# Patient Record
Sex: Female | Born: 1945 | Race: White | Hispanic: No | Marital: Married | Smoking: Former smoker
Health system: Southern US, Community
[De-identification: ages and names within clinical notes are randomized; demographics above are authoritative.]

## PROBLEM LIST (undated history)

## (undated) DIAGNOSIS — E785 Hyperlipidemia, unspecified: Secondary | ICD-10-CM

## (undated) DIAGNOSIS — M8580 Other specified disorders of bone density and structure, unspecified site: Secondary | ICD-10-CM

## (undated) DIAGNOSIS — I10 Essential (primary) hypertension: Secondary | ICD-10-CM

## (undated) DIAGNOSIS — M1990 Unspecified osteoarthritis, unspecified site: Secondary | ICD-10-CM

## (undated) DIAGNOSIS — H269 Unspecified cataract: Secondary | ICD-10-CM

## (undated) HISTORY — PX: LAPAROSCOPIC CHOLECYSTECTOMY: SUR755

## (undated) HISTORY — DX: Unspecified cataract: H26.9

## (undated) HISTORY — DX: Hyperlipidemia, unspecified: E78.5

## (undated) HISTORY — PX: OOPHORECTOMY: SHX86

## (undated) HISTORY — DX: Essential (primary) hypertension: I10

## (undated) HISTORY — DX: Other specified disorders of bone density and structure, unspecified site: M85.80

---
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
## 1997-12-27 ENCOUNTER — Emergency Department (HOSPITAL_COMMUNITY): Admission: EM | Admit: 1997-12-27 | Discharge: 1997-12-27 | Payer: Self-pay | Admitting: Emergency Medicine

## 1998-05-13 ENCOUNTER — Other Ambulatory Visit: Admission: RE | Admit: 1998-05-13 | Discharge: 1998-05-13 | Payer: Self-pay | Admitting: Family Medicine

## 1998-09-04 ENCOUNTER — Encounter: Payer: Self-pay | Admitting: Emergency Medicine

## 1998-09-04 ENCOUNTER — Emergency Department (HOSPITAL_COMMUNITY): Admission: EM | Admit: 1998-09-04 | Discharge: 1998-09-04 | Payer: Self-pay | Admitting: Emergency Medicine

## 1999-10-16 HISTORY — PX: CYSTOSCOPY: SUR368

## 2000-03-27 ENCOUNTER — Other Ambulatory Visit: Admission: RE | Admit: 2000-03-27 | Discharge: 2000-03-27 | Payer: Self-pay | Admitting: Obstetrics and Gynecology

## 2001-07-15 LAB — HM COLONOSCOPY: HM Colonoscopy: NORMAL

## 2001-07-26 ENCOUNTER — Emergency Department (HOSPITAL_COMMUNITY): Admission: EM | Admit: 2001-07-26 | Discharge: 2001-07-26 | Payer: Self-pay | Admitting: Emergency Medicine

## 2001-07-26 ENCOUNTER — Encounter: Payer: Self-pay | Admitting: Emergency Medicine

## 2001-08-12 ENCOUNTER — Ambulatory Visit (HOSPITAL_COMMUNITY): Admission: RE | Admit: 2001-08-12 | Discharge: 2001-08-12 | Payer: Self-pay | Admitting: Gastroenterology

## 2002-06-17 ENCOUNTER — Other Ambulatory Visit: Admission: RE | Admit: 2002-06-17 | Discharge: 2002-06-17 | Payer: Self-pay | Admitting: Obstetrics and Gynecology

## 2003-07-16 ENCOUNTER — Encounter: Payer: Self-pay | Admitting: Family Medicine

## 2003-07-16 LAB — HM MAMMOGRAPHY

## 2003-10-12 ENCOUNTER — Other Ambulatory Visit: Admission: RE | Admit: 2003-10-12 | Discharge: 2003-10-12 | Payer: Self-pay | Admitting: Obstetrics and Gynecology

## 2004-03-28 ENCOUNTER — Ambulatory Visit: Payer: Self-pay | Admitting: Family Medicine

## 2004-08-01 ENCOUNTER — Ambulatory Visit: Payer: Self-pay | Admitting: Family Medicine

## 2004-08-04 ENCOUNTER — Ambulatory Visit: Payer: Self-pay | Admitting: Family Medicine

## 2004-10-06 ENCOUNTER — Ambulatory Visit: Payer: Self-pay | Admitting: Family Medicine

## 2004-10-19 ENCOUNTER — Other Ambulatory Visit: Admission: RE | Admit: 2004-10-19 | Discharge: 2004-10-19 | Payer: Self-pay | Admitting: Obstetrics and Gynecology

## 2005-01-30 ENCOUNTER — Ambulatory Visit: Payer: Self-pay | Admitting: Family Medicine

## 2005-08-28 ENCOUNTER — Ambulatory Visit: Payer: Self-pay | Admitting: Family Medicine

## 2005-08-28 LAB — CONVERTED CEMR LAB: Hgb A1c MFr Bld: 5.8 %

## 2006-04-24 ENCOUNTER — Ambulatory Visit: Payer: Self-pay | Admitting: Family Medicine

## 2006-05-08 ENCOUNTER — Ambulatory Visit: Payer: Self-pay | Admitting: Family Medicine

## 2006-07-25 ENCOUNTER — Encounter: Payer: Self-pay | Admitting: Family Medicine

## 2006-07-25 DIAGNOSIS — E7800 Pure hypercholesterolemia, unspecified: Secondary | ICD-10-CM | POA: Insufficient documentation

## 2006-07-25 DIAGNOSIS — M8580 Other specified disorders of bone density and structure, unspecified site: Secondary | ICD-10-CM

## 2006-07-25 DIAGNOSIS — E119 Type 2 diabetes mellitus without complications: Secondary | ICD-10-CM | POA: Insufficient documentation

## 2006-07-25 DIAGNOSIS — Z87448 Personal history of other diseases of urinary system: Secondary | ICD-10-CM | POA: Insufficient documentation

## 2006-07-25 DIAGNOSIS — I10 Essential (primary) hypertension: Secondary | ICD-10-CM | POA: Insufficient documentation

## 2006-07-26 ENCOUNTER — Ambulatory Visit: Payer: Self-pay | Admitting: Family Medicine

## 2006-07-26 DIAGNOSIS — B351 Tinea unguium: Secondary | ICD-10-CM | POA: Insufficient documentation

## 2006-07-30 ENCOUNTER — Ambulatory Visit: Payer: Self-pay | Admitting: Family Medicine

## 2006-07-31 LAB — CONVERTED CEMR LAB
Albumin: 3.9 g/dL (ref 3.5–5.2)
CO2: 32 meq/L (ref 19–32)
Creatinine, Ser: 0.7 mg/dL (ref 0.4–1.2)
GFR calc non Af Amer: 91 mL/min
Phosphorus: 4.1 mg/dL (ref 2.3–4.6)
Sodium: 144 meq/L (ref 135–145)
Total CHOL/HDL Ratio: 2
Triglycerides: 54 mg/dL (ref 0–149)

## 2007-08-12 ENCOUNTER — Ambulatory Visit: Payer: Self-pay | Admitting: Family Medicine

## 2007-08-14 LAB — CONVERTED CEMR LAB
AST: 20 units/L (ref 0–37)
Alkaline Phosphatase: 83 units/L (ref 39–117)
Basophils Absolute: 0 10*3/uL (ref 0.0–0.1)
CO2: 30 meq/L (ref 19–32)
Calcium: 9.7 mg/dL (ref 8.4–10.5)
Cholesterol: 152 mg/dL (ref 0–200)
Creatinine, Ser: 0.8 mg/dL (ref 0.4–1.2)
Glucose, Bld: 104 mg/dL — ABNORMAL HIGH (ref 70–99)
Hgb A1c MFr Bld: 6.3 % — ABNORMAL HIGH (ref 4.6–6.0)
LDL Cholesterol: 68 mg/dL (ref 0–99)
Lymphocytes Relative: 33.1 % (ref 12.0–46.0)
MCHC: 34.8 g/dL (ref 30.0–36.0)
Neutrophils Relative %: 53.1 % (ref 43.0–77.0)
Platelets: 257 10*3/uL (ref 150–400)
RDW: 12.7 % (ref 11.5–14.6)
Total Bilirubin: 0.9 mg/dL (ref 0.3–1.2)
Triglycerides: 110 mg/dL (ref 0–149)
VLDL: 22 mg/dL (ref 0–40)
Vit D, 1,25-Dihydroxy: 26 — ABNORMAL LOW (ref 30–89)

## 2008-01-16 HISTORY — PX: FRACTURE SURGERY: SHX138

## 2008-06-07 ENCOUNTER — Ambulatory Visit: Payer: Self-pay | Admitting: Family Medicine

## 2008-06-09 ENCOUNTER — Telehealth: Payer: Self-pay | Admitting: Family Medicine

## 2008-07-14 ENCOUNTER — Ambulatory Visit: Payer: Self-pay | Admitting: Family Medicine

## 2008-07-14 LAB — CONVERTED CEMR LAB
Glucose, Urine, Semiquant: NEGATIVE
Protein, U semiquant: 30
Specific Gravity, Urine: 1.015
Urobilinogen, UA: 0.2

## 2008-07-15 ENCOUNTER — Encounter (INDEPENDENT_AMBULATORY_CARE_PROVIDER_SITE_OTHER): Payer: Self-pay | Admitting: Internal Medicine

## 2008-07-30 ENCOUNTER — Ambulatory Visit: Payer: Self-pay | Admitting: Family Medicine

## 2008-07-30 DIAGNOSIS — E559 Vitamin D deficiency, unspecified: Secondary | ICD-10-CM

## 2008-08-03 LAB — CONVERTED CEMR LAB
Calcium: 9.6 mg/dL (ref 8.4–10.5)
Creatinine, Ser: 0.9 mg/dL (ref 0.4–1.2)
Creatinine,U: 28 mg/dL
Glucose, Bld: 69 mg/dL — ABNORMAL LOW (ref 70–99)
Hgb A1c MFr Bld: 6.6 % — ABNORMAL HIGH (ref 4.6–6.5)
LDL Cholesterol: 71 mg/dL (ref 0–99)
Microalb, Ur: 0.2 mg/dL (ref 0.0–1.9)
Phosphorus: 4 mg/dL (ref 2.3–4.6)
Potassium: 4.1 meq/L (ref 3.5–5.1)
Sodium: 141 meq/L (ref 135–145)
Total CHOL/HDL Ratio: 2
VLDL: 14 mg/dL (ref 0.0–40.0)

## 2008-08-17 ENCOUNTER — Ambulatory Visit: Payer: Self-pay | Admitting: Family Medicine

## 2008-11-15 ENCOUNTER — Ambulatory Visit: Payer: Self-pay | Admitting: Internal Medicine

## 2009-02-03 ENCOUNTER — Ambulatory Visit: Payer: Self-pay | Admitting: Family Medicine

## 2009-02-04 LAB — CONVERTED CEMR LAB
Albumin: 4 g/dL (ref 3.5–5.2)
BUN: 17 mg/dL (ref 6–23)
Calcium: 9.6 mg/dL (ref 8.4–10.5)
Creatinine, Ser: 0.7 mg/dL (ref 0.4–1.2)
Glucose, Bld: 111 mg/dL — ABNORMAL HIGH (ref 70–99)
Phosphorus: 3.9 mg/dL (ref 2.3–4.6)

## 2009-02-17 ENCOUNTER — Ambulatory Visit: Payer: Self-pay | Admitting: Family Medicine

## 2009-03-22 ENCOUNTER — Ambulatory Visit: Payer: Self-pay | Admitting: Family Medicine

## 2009-08-12 ENCOUNTER — Ambulatory Visit: Payer: Self-pay | Admitting: Family Medicine

## 2009-08-15 LAB — CONVERTED CEMR LAB
AST: 20 units/L (ref 0–37)
Albumin: 4.3 g/dL (ref 3.5–5.2)
Chloride: 99 meq/L (ref 96–112)
GFR calc non Af Amer: 96.19 mL/min (ref >60)
Glucose, Bld: 102 mg/dL — ABNORMAL HIGH (ref 70–99)
Hgb A1c MFr Bld: 6.2 % (ref 4.6–6.5)
LDL Cholesterol: 90 mg/dL (ref 0–99)
Phosphorus: 4.1 mg/dL (ref 2.3–4.6)
Potassium: 3.9 meq/L (ref 3.5–5.1)
Total CHOL/HDL Ratio: 3
VLDL: 15.4 mg/dL (ref 0.0–40.0)

## 2009-10-29 ENCOUNTER — Emergency Department (HOSPITAL_COMMUNITY): Admission: EM | Admit: 2009-10-29 | Discharge: 2009-10-29 | Payer: Self-pay | Admitting: Emergency Medicine

## 2010-01-24 ENCOUNTER — Ambulatory Visit
Admission: RE | Admit: 2010-01-24 | Discharge: 2010-01-24 | Payer: Self-pay | Source: Home / Self Care | Attending: Family Medicine | Admitting: Family Medicine

## 2010-01-24 DIAGNOSIS — N390 Urinary tract infection, site not specified: Secondary | ICD-10-CM | POA: Insufficient documentation

## 2010-01-24 LAB — CONVERTED CEMR LAB
Ketones, urine, test strip: NEGATIVE
Nitrite: NEGATIVE
Specific Gravity, Urine: 1.01
Urobilinogen, UA: 0.2

## 2010-01-25 ENCOUNTER — Encounter: Payer: Self-pay | Admitting: Family Medicine

## 2010-01-30 ENCOUNTER — Other Ambulatory Visit: Payer: Self-pay | Admitting: Family Medicine

## 2010-01-30 ENCOUNTER — Encounter: Payer: Self-pay | Admitting: Family Medicine

## 2010-01-30 ENCOUNTER — Ambulatory Visit
Admission: RE | Admit: 2010-01-30 | Discharge: 2010-01-30 | Payer: Self-pay | Source: Home / Self Care | Attending: Family Medicine | Admitting: Family Medicine

## 2010-01-30 LAB — RENAL FUNCTION PANEL
Albumin: 4.3 g/dL (ref 3.5–5.2)
BUN: 22 mg/dL (ref 6–23)
CO2: 28 mEq/L (ref 19–32)
Calcium: 9.9 mg/dL (ref 8.4–10.5)
Chloride: 100 mEq/L (ref 96–112)
Creatinine, Ser: 0.9 mg/dL (ref 0.4–1.2)
GFR: 68.92 mL/min (ref >60.00)
Glucose, Bld: 129 mg/dL — ABNORMAL HIGH (ref 70–99)
Phosphorus: 3.6 mg/dL (ref 2.3–4.6)
Potassium: 3.8 mEq/L (ref 3.5–5.1)
Sodium: 138 mEq/L (ref 135–145)

## 2010-01-30 LAB — LIPID PANEL
Cholesterol: 178 mg/dL (ref 0–200)
HDL: 64 mg/dL (ref >39.00)
LDL Cholesterol: 92 mg/dL (ref 0–99)
Total CHOL/HDL Ratio: 3
Triglycerides: 108 mg/dL (ref 0.0–149.0)
VLDL: 21.6 mg/dL (ref 0.0–40.0)

## 2010-01-30 LAB — HEMOGLOBIN A1C: Hgb A1c MFr Bld: 6.6 % — ABNORMAL HIGH (ref 4.6–6.5)

## 2010-01-30 LAB — ALT: ALT: 20 U/L (ref 0–35)

## 2010-01-30 LAB — AST: AST: 20 U/L (ref 0–37)

## 2010-02-08 ENCOUNTER — Ambulatory Visit
Admission: RE | Admit: 2010-02-08 | Discharge: 2010-02-08 | Payer: Self-pay | Source: Home / Self Care | Attending: Family Medicine | Admitting: Family Medicine

## 2010-02-08 LAB — HM DIABETES FOOT EXAM

## 2010-02-16 NOTE — Letter (Signed)
Summary: Out of Work  [facility]  [address] [place], [place] [address]   Phone: [phone]  Fax: [phone]    March 22, 2009   Employee:  Amy Fry    To Whom It May Concern:   For Medical reasons, please excuse the above named employee from work for the following dates:  Start:   03/22/2009   End:   can return 3/14 if she is feeling better from the flu  If you need additional information, please feel free to contact our office.         Sincerely,    Judith Part MD

## 2010-02-16 NOTE — Assessment & Plan Note (Signed)
Summary: FOLLOW UP   Vital Signs:  Patient profile:   65 year old female Weight:      184 pounds BMI:     31.21 Temp:     97.9 degrees F oral Pulse rate:   68 / minute Pulse rhythm:   regular BP sitting:   120 / 80  (left arm) Cuff size:   regular  Vitals Entered By: Lowella Petties CMA (February 17, 2009 8:43 AM) CC: follow-up visit   History of Present Illness: here for f/u of DM and HTN   is doing ok overall  fell and fx a rib since her last visit -- at work / tripped and fell  a really bad fall  is starting to feel better now    wt is up 3 lb with bmi of 31  DM is improved AIC down from 6.6 to 6.3 eating has been fair- does try to watch sugars  is now just getting back to exercise  is up to date opthy on asa  not on ace  bp is well controlled - no headache or other symptoms     Allergies: 1)  Fosamax 2)  Lipitor 3)  Pravachol  Past History:  Past Medical History: Last updated: 08/12/2007 Diabetes mellitus, type II Hypertension Osteopenia cataracts-- no sx yet   GYN-- Dr Arelia Sneddon   Family History: Last updated: August 05, 2006 Father:  Mother: deceased age 36, [place] Siblings: brother deceased age 22- [place], brother deceaseed in his 71's- CVA. sister with depression  Social History: Last updated: 08/12/2007 Marital Status: Married Children: 2 Occupation: CNA non smoker   Risk Factors: Smoking Status: never (August 05, 2006)  Past Surgical History: Cholecystectomy Oophorectomy Bladder US/  cystoscopy- negt (10/1999) Colonoscopy- neg (07/2001) Dexa- good per pt. (2003) Cardiolite- neg (04/2005) Echo- ? diastolic dysfunction 2010- fx rib from a fall   Review of Systems General:  Denies fatigue, loss of appetite, and malaise. Eyes:  Denies blurring and eye pain. CV:  Denies chest pain or discomfort, palpitations, and shortness of breath with exertion. Resp:  Denies cough and wheezing. GI:  Denies indigestion and nausea. GU:  Denies urinary  frequency. MS:  Denies cramps and muscle weakness. Derm:  Denies lesion(s), poor wound healing, and rash. Neuro:  Denies headaches, numbness, and tingling. Endo:  Denies excessive thirst and excessive urination.  Physical Exam  General:  overweight but generally well appearing  Head:  normocephalic, atraumatic, and no abnormalities observed.   Eyes:  vision grossly intact, pupils equal, pupils round, and pupils reactive to light.   Mouth:  pharynx pink and moist.   Neck:  supple with full rom and no masses or thyromegally, no JVD or carotid bruit  Lungs:  Normal respiratory effort, chest expands symmetrically. Lungs are clear to auscultation, no crackles or wheezes. Heart:  Normal rate and regular rhythm. S1 and S2 normal without gallop, murmur, click, rub or other extra sounds. Msk:  No deformity or scoliosis noted of thoracic or lumbar spine.   Pulses:  R and L carotid,radial,femoral,dorsalis pedis and posterior tibial pulses are full and equal bilaterally Extremities:  No clubbing, cyanosis, edema, or deformity noted with normal full range of motion of all joints.   Skin:  Intact without suspicious lesions or rashes Cervical Nodes:  No lymphadenopathy noted Psych:  normal affect, talkative and pleasant   Diabetes Management Exam:    Foot Exam (with socks and/or shoes not present):       Sensory-Pinprick/Light touch:  Left medial foot (L-4): normal          Left dorsal foot (L-5): normal          Left lateral foot (S-1): normal          Right medial foot (L-4): normal          Right dorsal foot (L-5): normal          Right lateral foot (S-1): normal       Sensory-Monofilament:          Left foot: normal          Right foot: normal       Inspection:          Left foot: normal          Right foot: normal       Nails:          Left foot: normal          Right foot: normal   Impression & Recommendations:  Problem # 1:  HYPERTENSION (ICD-401.9) Assessment  Unchanged  will add ace for renal prot  rev poss side eff  lab and f/u 6 mo   Her updated medication list for this problem includes:    Hydrochlorothiazide 25 Mg Tabs (Hydrochlorothiazide) .Marland Kitchen... Take one by mouth daily    Lisinopril 5 Mg Tabs (Lisinopril) .Marland Kitchen... 1 by mouth once daily  Orders: Prescription Created Electronically [phone])  BP today: 140/80 Prior BP: 120/74 (08/17/2008)  Prior 10 Yr Risk Heart Disease: 9 % (07/30/2008)  Labs Reviewed: K+: 3.9 (02/03/2009) Creat: : 0.7 (02/03/2009)   Chol: 152 (07/30/2008)   HDL: 66.90 (07/30/2008)   LDL: 71 (07/30/2008)   TG: 70.0 (07/30/2008)  Problem # 2:  DIABETES MELLITUS, TYPE II (ICD-250.00) Assessment: Improved  overall slt imp / diet control disc healthy diet (low simple sugar/ choose complex carbs/ low sat fat) diet and exercise in detail  up to date opthy added ace lab and f/u in 6 mo  Her updated medication list for this problem includes:    Adult Aspirin Ec Low Strength 81 Mg Tbec (Aspirin) .Marland Kitchen... Take one by mouth daily    Lisinopril 5 Mg Tabs (Lisinopril) .Marland Kitchen... 1 by mouth once daily  Labs Reviewed: Creat: 0.7 (02/03/2009)     Last Eye Exam: normal (07/24/2008) Reviewed HgBA1c results: 6.3 (02/03/2009)  6.6 (07/30/2008)  Complete Medication List: 1)  Hydrochlorothiazide 25 Mg Tabs (Hydrochlorothiazide) .... Take one by mouth daily 2)  Gemfibrozil 600 Mg Tabs (Gemfibrozil) .... Take one by mouth twice a day 3)  Adult Aspirin Ec Low Strength 81 Mg Tbec (Aspirin) .... Take one by mouth daily 4)  Vitamin C 1000 Mg Tabs (Ascorbic acid) .... Take one by mouth daily 5)  Lovastatin 20 Mg Tabs (Lovastatin) .... Take one by mouth daily 6)  Vitamin D 1000 Unit Tabs (Cholecalciferol) .... One daily by mouth 7)  Fish Oil 1000 Mg Caps (Omega-3 fatty acids) .... Take 1 by mouth once daily 8)  Calcium-vitamin D 250-125 Mg-unit Tabs (Calcium carbonate-vitamin d) .... Take 2 by mouth once daily 9)  Biotin 10 Mg Tabs (Biotin)  .... Take 1 by mouth once daily 10)  Cranberry 400 Mg Caps (Cranberry) .... Take 1 by mouth once daily 11)  Lisinopril 5 Mg Tabs (Lisinopril) .Marland Kitchen.. 1 by mouth once daily  Patient Instructions: 1)  start lisinopril - this helps blood pressure and protects kidneys  2)  I sent this to your pharmacy 3)  if side  effects or problems let me know  4)  schedule fasting lab in 6 months and then follow up  lipid/ast/alt/renal/AIC/microalb 250/0, 272 Prescriptions: LISINOPRIL 5 MG TABS (LISINOPRIL) 1 by mouth once daily  #30 x 11   Entered and Authorized by:   Judith Part MD   Signed by:   Judith Part MD on 02/17/2009   Method used:   Electronically to        [facility]* (retail)       [address]       [place], [place]  [address]       Ph: [phone]       Fax: [phone]   RxID:   [id number]   Prior Medications (reviewed today): HYDROCHLOROTHIAZIDE 25 MG  TABS (HYDROCHLOROTHIAZIDE) take one by mouth daily GEMFIBROZIL 600 MG  TABS (GEMFIBROZIL) take one by mouth twice a day ADULT ASPIRIN EC LOW STRENGTH 81 MG  TBEC (ASPIRIN) take one by mouth daily VITAMIN C 1000 MG  TABS (ASCORBIC ACID) take one by mouth daily LOVASTATIN 20 MG  TABS (LOVASTATIN) take one by mouth daily VITAMIN D 1000 UNIT  TABS (CHOLECALCIFEROL) one daily by mouth FISH OIL 1000 MG CAPS (OMEGA-3 FATTY ACIDS) take 1 by mouth once daily CALCIUM-VITAMIN D 250-125 MG-UNIT TABS (CALCIUM CARBONATE-VITAMIN D) take 2 by mouth once daily BIOTIN 10 MG TABS (BIOTIN) take 1 by mouth once daily CRANBERRY 400 MG CAPS (CRANBERRY) take 1 by mouth once daily LISINOPRIL 5 MG TABS (LISINOPRIL) 1 by mouth once daily Current Allergies: FOSAMAX LIPITOR PRAVACHOL   Preventive Care Screening     Td is within 10 years  declines flu and pneumovax

## 2010-02-16 NOTE — Miscellaneous (Signed)
Summary: Controlled Substance Agreement  Controlled Substance Agreement   Imported By: Lanelle Bal 08/17/2009 09:32:35  _____________________________________________________________________  External Attachment:    Type:   Image     Comment:   External Document

## 2010-02-16 NOTE — Assessment & Plan Note (Signed)
Summary: REFILL MEDICATIONS/CLE   Vital Signs:  Patient profile:   65 year old female Height:      64.5 inches Weight:      187.50 pounds BMI:     31.80 Temp:     97.9 degrees F oral Pulse rate:   80 / minute Pulse rhythm:   regular BP sitting:   116 / 76  (left arm) Cuff size:   regular  Vitals Entered By: Lewanda Rife LPN (August 12, 2009 9:06 AM) CC: refill meds   History of Present Illness: here for f/u of HTN and lipids and DM has been keeping busy this summer   has retired -  ? not as much exercise  is working in the yard for that  once per week -- about 2 hours   cannot afford a gym  does have some exercise videos  could walk outside in better weather   is sticking to a low sugar and low fat diet  only eats sugar free sweets  no dairy and avoids red meat  too many fried foods   wt is up 11 lb  DM had been well controlled  opthy- was sept -- and was ok -- at [facility]   on lovastatin and gemfibrozil Last Lipid ProfileCholesterol: 152 (07/30/2008 10:45:01 AM)HDL:  66.90 (07/30/2008 10:45:01 AM)LDL:  71 (07/30/2008 10:45:01 AM)Triglycerides:  Last Liver profileSGOT:  20 (08/12/2007 12:29:00 PM)SPGT:  21 (08/12/2007 12:29:00 PM)T. Bili:  0.9 (08/12/2007 12:29:00 PM)Alk Phos:  83 (08/12/2007 12:29:00 PM)  good contrl   HTN good - 116.76 today    Allergies: 1)  Fosamax 2)  Lipitor 3)  Pravachol  Past History:  Past Surgical History: Last updated: 02/17/2009 Cholecystectomy Oophorectomy Bladder US/  cystoscopy- negt (10/1999) Colonoscopy- neg (07/2001) Dexa- good per pt. (2003) Cardiolite- neg (04/2005) Echo- ? diastolic dysfunction 2010- fx rib from a fall   Family History: Last updated: 2006/08/23 Father:  Mother: deceased age 22, [place] Siblings: brother deceased age 27- [place], brother deceaseed in his 77's- CVA. sister with depression  Social History: Last updated: 08/12/2007 Marital Status: Married Children: 2 Occupation: CNA non smoker     Risk Factors: Smoking Status: never (08-23-2006)  Past Medical History: Diabetes mellitus, type II Hypertension Osteopenia cataracts-- no sx yet lipids - LDL and trig     GYN-- Dr Arelia Sneddon   Review of Systems General:  Denies fatigue, loss of appetite, and malaise. Eyes:  Denies blurring and eye pain. CV:  Denies chest pain or discomfort, fatigue, and lightheadness. Resp:  Denies cough, shortness of breath, and wheezing. GI:  Denies abdominal pain, change in bowel habits, indigestion, and nausea. GU:  Denies dysuria and urinary frequency. MS:  Denies joint pain, joint redness, and joint swelling. Derm:  Denies itching, lesion(s), poor wound healing, and rash. Neuro:  Denies numbness and tingling. Endo:  Denies cold intolerance, excessive thirst, excessive urination, and heat intolerance. Heme:  Denies abnormal bruising and bleeding.  Physical Exam  General:  overweight but generally well appearing  Head:  normocephalic, atraumatic, and no abnormalities observed.   Eyes:  vision grossly intact, pupils equal, pupils round, and pupils reactive to light.  no conjunctival pallor, injection or icterus  Mouth:  pharynx pink and moist.   Neck:  supple with full rom and no masses or thyromegally, no JVD or carotid bruit  Chest Wall:  No deformities, masses, or tenderness noted. Lungs:  Normal respiratory effort, chest expands symmetrically. Lungs are clear to auscultation, no crackles  or wheezes. Heart:  Normal rate and regular rhythm. S1 and S2 normal without gallop, murmur, click, rub or other extra sounds. Abdomen:  Bowel sounds positive,abdomen soft and non-tender without masses, organomegaly or hernias noted. no renal bruits  Msk:  No deformity or scoliosis noted of thoracic or lumbar spine.  no acute joint changes  Pulses:  R and L carotid,radial,femoral,dorsalis pedis and posterior tibial pulses are full and equal bilaterally Extremities:  No clubbing, cyanosis, edema, or  deformity noted with normal full range of motion of all joints.   Neurologic:  sensation intact to light touch, gait normal, and DTRs symmetrical and normal.   Skin:  Intact without suspicious lesions or rashes Cervical Nodes:  No lymphadenopathy noted Inguinal Nodes:  No significant adenopathy Psych:  normal affect, talkative and pleasant   Diabetes Management Exam:    Foot Exam (with socks and/or shoes not present):       Sensory-Pinprick/Light touch:          Left medial foot (L-4): normal          Left dorsal foot (L-5): normal          Left lateral foot (S-1): normal          Right medial foot (L-4): normal          Right dorsal foot (L-5): normal          Right lateral foot (S-1): normal       Sensory-Monofilament:          Left foot: normal          Right foot: normal       Inspection:          Left foot: normal          Right foot: normal       Nails:          Left foot: normal          Right foot: normal    Eye Exam:       Eye Exam done elsewhere          Date: 09/15/2008          Results: normal          Done by: [facility]   Impression & Recommendations:  Problem # 1:  Hx of HYPERCHOLESTEROLEMIA (ICD-272.0) Assessment Unchanged  rev low sat fat diet adv to stop eating fried foods labs today - f/u 6 mo  meds renewed  Her updated medication list for this problem includes:    Gemfibrozil 600 Mg Tabs (Gemfibrozil) .Marland Kitchen... Take one by mouth twice a day    Lovastatin 20 Mg Tabs (Lovastatin) .Marland Kitchen... Take one by mouth daily  Labs Reviewed: SGOT: 20 (08/12/2007)   SGPT: 21 (08/12/2007)  Prior 10 Yr Risk Heart Disease: 9 % (07/30/2008)   HDL:66.90 (07/30/2008), 62.0 (08/12/2007)  LDL:71 (07/30/2008), 68 (08/12/2007)  Chol:152 (07/30/2008), 152 (08/12/2007)  Trig:70.0 (07/30/2008), 110 (08/12/2007)  Orders: Venipuncture ([address]) TLB-Lipid Panel (80061-LIPID) TLB-Renal Function Panel (80069-RENAL) TLB-ALT (SGPT) (84460-ALT) TLB-AST (SGOT)  (84450-SGOT) TLB-A1C / Hgb A1C (Glycohemoglobin) (83036-A1C) Prescription Created Electronically [id number])  Problem # 2:  HYPERTENSION (ICD-401.9) Assessment: Unchanged  good bp without change lab today f/u 6 mo  Her updated medication list for this problem includes:    Hydrochlorothiazide 25 Mg Tabs (Hydrochlorothiazide) .Marland Kitchen... Take one by mouth daily    Lisinopril 5 Mg Tabs (Lisinopril) .Marland Kitchen... 1 by mouth once daily  Orders: Venipuncture ([address]) TLB-Lipid Panel (80061-LIPID) TLB-Renal  Function Panel (80069-RENAL) TLB-ALT (SGPT) (84460-ALT) TLB-AST (SGOT) (84450-SGOT) TLB-A1C / Hgb A1C (Glycohemoglobin) (83036-A1C) Prescription Created Electronically ([id number])  BP today: 116/76 Prior BP: 120/72 (03/22/2009)  Prior 10 Yr Risk Heart Disease: 9 % (07/30/2008)  Labs Reviewed: K+: 3.9 (02/03/2009) Creat: : 0.7 (02/03/2009)   Chol: 152 (07/30/2008)   HDL: 66.90 (07/30/2008)   LDL: 71 (07/30/2008)   TG: 70.0 (07/30/2008)  Problem # 3:  DIABETES MELLITUS, TYPE II (ICD-250.00) Assessment: Unchanged  wt gain of 11 lb is concerning  disc plan for daily exercise and smaller portions lab today utd opthy lab and f/u 6 mo  Her updated medication list for this problem includes:    Adult Aspirin Ec Low Strength 81 Mg Tbec (Aspirin) .Marland Kitchen... Take one by mouth daily    Lisinopril 5 Mg Tabs (Lisinopril) .Marland Kitchen... 1 by mouth once daily  Orders: Venipuncture ([address]) TLB-Lipid Panel (80061-LIPID) TLB-Renal Function Panel (80069-RENAL) TLB-ALT (SGPT) (84460-ALT) TLB-AST (SGOT) (84450-SGOT) TLB-A1C / Hgb A1C (Glycohemoglobin) (83036-A1C) Prescription Created Electronically [id number])  Labs Reviewed: Creat: 0.7 (02/03/2009)     Last Eye Exam: normal (07/24/2008) Reviewed HgBA1c results: 6.3 (02/03/2009)  6.6 (07/30/2008)  Complete Medication List: 1)  Hydrochlorothiazide 25 Mg Tabs (Hydrochlorothiazide) .... Take one by mouth daily 2)  Gemfibrozil 600 Mg Tabs (Gemfibrozil) .... Take one by mouth  twice a day 3)  Adult Aspirin Ec Low Strength 81 Mg Tbec (Aspirin) .... Take one by mouth daily 4)  Vitamin C 1000 Mg Tabs (Ascorbic acid) .... Take one by mouth daily 5)  Lovastatin 20 Mg Tabs (Lovastatin) .... Take one by mouth daily 6)  Vitamin D 1000 Unit Tabs (Cholecalciferol) .... One daily by mouth 7)  Fish Oil 1000 Mg Caps (Omega-3 fatty acids) .... Take 1 by mouth once daily 8)  Calcium-vitamin D 250-125 Mg-unit Tabs (Calcium carbonate-vitamin d) .... Take 2 by mouth once daily 9)  Biotin 10 Mg Tabs (Biotin) .... Take 1 by mouth once daily 10)  Cranberry 400 Mg Caps (Cranberry) .... Take 1 by mouth once daily 11)  Lisinopril 5 Mg Tabs (Lisinopril) .Marland Kitchen.. 1 by mouth once daily 12)  Tussionex Pennkinetic Er 8-10 Mg/37ml Lqcr (Chlorpheniramine-hydrocodone) .... 1/2 to 1 teaspoon by mouth up to two times a day as needed severe cough  watch for sedation  Patient Instructions: 1)  exercise at least 30 minutes 5 days per week  2)  yard work counts for that day 3)  when it is hot or cold - do video inside 4)  when it is nich- walk outdoors  5)  labs today  6)  schedule fasting labs then f/u in 6 months lipid/ast/alt/renal/ AIC / vit D 733.0, 250.0, 272  7)  stop fried foods  Prescriptions: LISINOPRIL 5 MG TABS (LISINOPRIL) 1 by mouth once daily  #30 x 11   Entered and Authorized by:   Judith Part MD   Signed by:   Judith Part MD on 08/12/2009   Method used:   Electronically to        [facility]* (retail)       [address]       [place], [place]  [address]       Ph: [phone]       Fax: [phone]   RxID:   [id number] LOVASTATIN 20 MG  TABS (LOVASTATIN) take one by mouth daily  #30 x 11   Entered and Authorized by:   Judith Part MD   Signed by:   [facility]  MD on 08/12/2009   Method used:   Electronically to        [facility]* (retail)       [address]       [place], [place]  [address]       Ph: [phone]       Fax: [phone]   RxID:    [id number] GEMFIBROZIL 600 MG  TABS (GEMFIBROZIL) take one by mouth twice a day  #60 x 11   Entered and Authorized by:   Judith Part MD   Signed by:   Judith Part MD on 08/12/2009   Method used:   Electronically to        [facility]* (retail)       [address]       [place], [place]  [address]       Ph: [phone]       Fax: [phone]   RxID:   [id number] HYDROCHLOROTHIAZIDE 25 MG  TABS (HYDROCHLOROTHIAZIDE) take one by mouth daily  #30 x 11   Entered and Authorized by:   Judith Part MD   Signed by:   Judith Part MD on 08/12/2009   Method used:   Electronically to        [facility]* (retail)       [address]       [place], [place]  [address]       Ph: [phone]       Fax: [phone]   RxID:   [id number]   Current Allergies (reviewed today): FOSAMAX LIPITOR PRAVACHOL

## 2010-02-16 NOTE — Assessment & Plan Note (Signed)
Summary: ?UTI/CLE   Vital Signs:  Patient profile:   65 year old female Height:      64.5 inches Weight:      198.25 pounds BMI:     33.63 Temp:     98.2 degrees F oral Pulse rate:   84 / minute Pulse rhythm:   regular BP sitting:   112 / 70  (left arm) Cuff size:   large  Vitals Entered By: Delilah Shan CMA Sergey Ishler Dull) (January 24, 2010 3:17 PM) CC: ? UTI   History of Present Illness: dysuria: yes duration of symptoms:3-4 days abdominal pain:yes fevers:no back pain: no vomiting:no other concerns: no "This feels like a UTI." Diet controlled DM.    Allergies: 1)  Fosamax 2)  Lipitor 3)  Pravachol  Review of Systems       See HPI.  Otherwise negative.    Physical Exam  General:  GEN: nad, alert and oriented HEENT: mucous membranes moist NECK: supple CV: rrr.  PULM: ctab, no inc wob ABD: soft, +bs, suprapubic area tender EXT: no edema SKIN: no acute rash BACK: no CVA pain    Impression & Recommendations:  Problem # 1:  UTI (ICD-599.0) check cx, start cipro and follow up as needed.  No sign of pyelo.  Nontoxic, follow up as needed.   Orders: Prescription Created Electronically [phone]) Specimen Handling ([address]) T-Culture, Urine ([id number])  Her updated medication list for this problem includes:    Cipro 250 Mg Tabs (Ciprofloxacin hcl) .Marland Kitchen... 1 by mouth two times a day  Complete Medication List: 1)  Hydrochlorothiazide 25 Mg Tabs (Hydrochlorothiazide) .... Take one by mouth daily 2)  Gemfibrozil 600 Mg Tabs (Gemfibrozil) .... Take one by mouth twice a day 3)  Adult Aspirin Ec Low Strength 81 Mg Tbec (Aspirin) .... Take one by mouth daily 4)  Vitamin C 1000 Mg Tabs (Ascorbic acid) .... Take one by mouth daily 5)  Lovastatin 20 Mg Tabs (Lovastatin) .... Take one by mouth daily 6)  Vitamin D 1000 Unit Tabs (Cholecalciferol) .... One daily by mouth 7)  Fish Oil 1000 Mg Caps (Omega-3 fatty acids) .... Take 1 by mouth once daily 8)  Calcium-vitamin D 250-125  Mg-unit Tabs (Calcium carbonate-vitamin d) .... Take 2 by mouth once daily 9)  Biotin 10 Mg Tabs (Biotin) .... Take 1 by mouth once daily 10)  Cranberry 400 Mg Caps (Cranberry) .... Take 1 by mouth once daily 11)  Lisinopril 5 Mg Tabs (Lisinopril) .Marland Kitchen.. 1 by mouth once daily 12)  Tussionex Pennkinetic Er 8-10 Mg/35ml Lqcr (Chlorpheniramine-hydrocodone) .... 1/2 to 1 teaspoon by mouth up to two times a day as needed severe cough  watch for sedation 13)  Cipro 250 Mg Tabs (Ciprofloxacin hcl) .Marland Kitchen.. 1 by mouth two times a day  Other Orders: UA Dipstick w/o Micro (manual) (29562)  Patient Instructions: 1)  We'll contact you with your lab report.  2)  I would start the antibiotics today.  3)  Drink plenty of fluids.   4)  Avoid caffeine & carbonated drinks, they tend to irritate the bladder.   5)  Let us know if you're not better or if you're feeling worse.  Prescriptions: CIPRO 250 MG TABS (CIPROFLOXACIN HCL) 1 by mouth two times a day  #6 x 0   Entered and Authorized by:   Crawford Givens MD   Signed by:   Crawford Givens MD on 01/24/2010   Method used:   Electronically to        [facility]* (retail)       [address]       [place], [place]  [address]       Ph: [phone]       Fax: [phone]   RxID:   [id number]    Orders Added: 1)  UA Dipstick w/o Micro (manual) [81002] 2)  Prescription Created Electronically [G8553] 3)  Est. Patient Level III [84132] 4)  Specimen Handling [99000] 5)  T-Culture, Urine [[id number]]    Current Allergies (reviewed today): FOSAMAX LIPITOR PRAVACHOL  Laboratory Results   Urine Tests  Date/Time Received: January 24, 2010 3:18 PM   Routine Urinalysis   Color: straw Appearance: Cloudy Glucose: negative   (Normal Range: Negative) Bilirubin: negative   (Normal Range: Negative) Ketone: negative   (Normal Range: Negative) Spec. Gravity: 1.010   (Normal Range: 1.003-1.035) Blood: small   (Normal Range: Negative) pH: 7.5   (Normal  Range: 5.0-8.0) Protein: negative   (Normal Range: Negative) Urobilinogen: 0.2   (Normal Range: 0-1) Nitrite: negative   (Normal Range: Negative) Leukocyte Esterace: large   (Normal Range: Negative)

## 2010-02-16 NOTE — Assessment & Plan Note (Signed)
Summary: COUGH,CONGESTION/CLE   Vital Signs:  Patient profile:   65 year old female Height:      64.5 inches Weight:      178.25 pounds BMI:     30.23 Temp:     100.5 degrees F oral Pulse rate:   80 / minute Pulse rhythm:   regular BP sitting:   120 / 72  (left arm) Cuff size:   regular  Vitals Entered By: Lewanda Rife LPN (March 22, 1608 2:53 PM)  History of Present Illness: is sick with dry cough  ears hurt and headache and congestion  has had it over weekend -- still had to work  slept yesterday and took mucinex  feels a bit worse today  fever 100.5 today -- has had the chills all day   did not get a flu shot this year   was achey - not now   lot of nasal drip no n/v/d  feels tired and weak  mucinex and tylenol otc   Allergies: 1)  Fosamax 2)  Lipitor 3)  Pravachol  Past History:  Past Medical History: Last updated: 08/12/2007 Diabetes mellitus, type II Hypertension Osteopenia cataracts-- no sx yet   GYN-- Dr Arelia Sneddon   Past Surgical History: Last updated: 02/17/2009 Cholecystectomy Oophorectomy Bladder US/  cystoscopy- negt (10/1999) Colonoscopy- neg (07/2001) Dexa- good per pt. (2003) Cardiolite- neg (04/2005) Echo- ? diastolic dysfunction 2010- fx rib from a fall   Family History: Last updated: 08-21-2006 Father:  Mother: deceased age 53, [place] Siblings: brother deceased age 63- [place], brother deceaseed in his 42's- CVA. sister with depression  Social History: Last updated: 08/12/2007 Marital Status: Married Children: 2 Occupation: CNA non smoker   Risk Factors: Smoking Status: never (08/21/06)  Review of Systems General:  Complains of chills, fatigue, fever, loss of appetite, and malaise. Eyes:  Denies blurring, discharge, and eye irritation. ENT:  Complains of nasal congestion, postnasal drainage, and sore throat; denies sinus pressure. CV:  Denies chest pain or discomfort and palpitations. Resp:  Complains of cough and sputum  productive; denies pleuritic. GI:  Denies diarrhea, nausea, and vomiting. Derm:  Denies rash.  Physical Exam  General:  fatigued appearing Head:  normocephalic, atraumatic, and no abnormalities observed.  no sinus tenderness Eyes:  vision grossly intact, pupils equal, pupils round, pupils reactive to light, and no injection.   Ears:  R ear normal and L ear normal.   Nose:  nares are dry and slt congested  Mouth:  pharynx pink and moist.   Neck:  supple with full rom and no masses or thyromegally, no JVD or carotid bruit  Lungs:  CTA with harsh bs at bases no wheeze/ rales or rhonchi  Heart:  Normal rate and regular rhythm. S1 and S2 normal without gallop, murmur, click, rub or other extra sounds. Skin:  Intact without suspicious lesions or rashes Cervical Nodes:  No lymphadenopathy noted Psych:  normal affect, talkative and pleasant    Impression & Recommendations:  Problem # 1:  INFLUENZA (ICD-487.8) Assessment New day 5 with cough and fever - out of window for tamiflu given tussionex for cough- use with caution  fluids/rest and off work rest of the week  pt advised to update me if symptoms worsen or do not improve - esp if prod cough or worse fever  Complete Medication List: 1)  Hydrochlorothiazide 25 Mg Tabs (Hydrochlorothiazide) .... Take one by mouth daily 2)  Gemfibrozil 600 Mg Tabs (Gemfibrozil) .... Take one by mouth twice a  day 3)  Adult Aspirin Ec Low Strength 81 Mg Tbec (Aspirin) .... Take one by mouth daily 4)  Vitamin C 1000 Mg Tabs (Ascorbic acid) .... Take one by mouth daily 5)  Lovastatin 20 Mg Tabs (Lovastatin) .... Take one by mouth daily 6)  Vitamin D 1000 Unit Tabs (Cholecalciferol) .... One daily by mouth 7)  Fish Oil 1000 Mg Caps (Omega-3 fatty acids) .... Take 1 by mouth once daily 8)  Calcium-vitamin D 250-125 Mg-unit Tabs (Calcium carbonate-vitamin d) .... Take 2 by mouth once daily 9)  Biotin 10 Mg Tabs (Biotin) .... Take 1 by mouth once daily 10)   Cranberry 400 Mg Caps (Cranberry) .... Take 1 by mouth once daily 11)  Lisinopril 5 Mg Tabs (Lisinopril) .Marland Kitchen.. 1 by mouth once daily 12)  Tussionex Pennkinetic Er 8-10 Mg/54ml Lqcr (Chlorpheniramine-hydrocodone) .... 1/2 to 1 teaspoon by mouth up to two times a day as needed severe cough  watch for sedation  Patient Instructions: 1)  stay out of work until [employer] for the flu  2)  drink lots of fluids and get rest  3)  continue tylenol for fever/aches and chills  4)  update me if higher fever or productive cough  5)  use tussionex for cough with caution for sedation  Prescriptions: TUSSIONEX PENNKINETIC ER 8-10 MG/5ML LQCR (CHLORPHENIRAMINE-HYDROCODONE) 1/2 to 1 teaspoon by mouth up to two times a day as needed severe cough  watch for sedation  #8 oz x 0   Entered and Authorized by:   Judith Part MD   Signed by:   Judith Part MD on 03/22/2009   Method used:   Print then Give to Patient   RxID:   [id number]   Current Allergies (reviewed today): FOSAMAX LIPITOR PRAVACHOL

## 2010-02-22 NOTE — Assessment & Plan Note (Signed)
Summary: ROA FOR 6 MONTH FOLLOW-UP/JRR   Vital Signs:  Patient profile:   65 year old female Height:      64.5 inches Weight:      200.75 pounds BMI:     34.05 Temp:     98.2 degrees F oral Pulse rate:   72 / minute Pulse rhythm:   regular BP sitting:   106 / 64  (left arm) Cuff size:   large  Vitals Entered By: Lewanda Rife LPN (February 08, 2010 1:49 PM) CC: six month f/u   History of Present Illness: here for f/u of HTN and dm and lipids and vit D def   has been feeling fine -- nothing new going on    wt is up 2 lb  HTN well controlled with 106/64  DM is slt worse with AIC 6.6 from 6.2 is not doing too well with wt loss -- is hard since retirement  has a lot going on and keeps busy  is after the holidays - ate poorly but not too many sweets (sugar free)  but ate too much in general  does not watch starches like she could  would like to start exercise early in am  is not that motivated  did subscribe to DM magazine - but does not want to go to classes  opthy-- had it in [facility]vember [facility] , no retinop   lipids are the same Last Lipid ProfileCholesterol: 178 (01/30/2010 9:45:39 AM)HDL:  64.00 (01/30/2010 9:45:39 AM)LDL:  92 (01/30/2010 9:45:39 AM)Triglycerides:  Last Liver profileSGOT:  20 (01/30/2010 9:45:39 AM)SPGT:  20 (01/30/2010 9:45:39 AM)T. Bili:  0.9 (08/12/2007 12:29:00 PM)Alk Phos:  83 (08/12/2007 12:29:00 PM)   D level is good at 65  Allergies: 1)  Fosamax 2)  Lipitor 3)  Pravachol  Review of Systems General:  Denies fatigue, loss of appetite, and malaise. Eyes:  Denies blurring and eye pain. CV:  Denies chest pain or discomfort, lightheadness, palpitations, and shortness of breath with exertion. Resp:  Denies cough, shortness of breath, and wheezing. GI:  Denies abdominal pain, change in bowel habits, indigestion, and nausea. GU:  Denies dysuria and urinary frequency. MS:  Denies joint pain, joint redness, joint swelling, and cramps. Derm:   Denies itching, lesion(s), poor wound healing, and rash. Neuro:  Denies numbness and tingling. Psych:  ok mood. Endo:  Denies excessive thirst and excessive urination. Heme:  Denies abnormal bruising and bleeding.  Physical Exam  General:  overweight but generally well appearing  Head:  normocephalic, atraumatic, and no abnormalities observed.   Eyes:  vision grossly intact, pupils equal, pupils round, and pupils reactive to light.  no conjunctival pallor, injection or icterus  Ears:  R ear normal and L ear normal.   Nose:  no nasal discharge.   Mouth:  pharynx pink and moist.   Neck:  supple with full rom and no masses or thyromegally, no JVD or carotid bruit  Chest Wall:  No deformities, masses, or tenderness noted. Lungs:  Normal respiratory effort, chest expands symmetrically. Lungs are clear to auscultation, no crackles or wheezes. Heart:  Normal rate and regular rhythm. S1 and S2 normal without gallop, murmur, click, rub or other extra sounds. Abdomen:  Bowel sounds positive,abdomen soft and non-tender without masses, organomegaly or hernias noted. no renal bruits  Msk:  No deformity or scoliosis noted of thoracic or lumbar spine.  no acute joint changes  Pulses:  R and L carotid,radial,femoral,dorsalis pedis and posterior tibial pulses are  full and equal bilaterally Extremities:  No clubbing, cyanosis, edema, or deformity noted with normal full range of motion of all joints.   Neurologic:  sensation intact to light touch, gait normal, and DTRs symmetrical and normal.   Skin:  Intact without suspicious lesions or rashes Cervical Nodes:  No lymphadenopathy noted Inguinal Nodes:  No significant adenopathy Psych:  normal affect, talkative and pleasant   Diabetes Management Exam:    Foot Exam (with socks and/or shoes not present):       Sensory-Pinprick/Light touch:          Left medial foot (L-4): normal          Left dorsal foot (L-5): normal          Left lateral foot (S-1):  normal          Right medial foot (L-4): normal          Right dorsal foot (L-5): normal          Right lateral foot (S-1): normal       Sensory-Monofilament:          Left foot: normal          Right foot: normal       Inspection:          Left foot: normal          Right foot: normal       Nails:          Left foot: normal          Right foot: normal    Eye Exam:       Eye Exam done elsewhere          Date: 11/15/2009          Results: normal          Done by: [facility]    Impression & Recommendations:  Problem # 1:  UNSPECIFIED VITAMIN D DEFICIENCY (ICD-268.9) Assessment Improved this is better with current supplementation will keep this the same  Problem # 2:  Hx of HYPERCHOLESTEROLEMIA (ICD-272.0) Assessment: Unchanged  good control - stable with med and diet - commended Her updated medication list for this problem includes:    Gemfibrozil 600 Mg Tabs (Gemfibrozil) .Marland Kitchen... Take one by mouth twice a day    Lovastatin 20 Mg Tabs (Lovastatin) .Marland Kitchen... Take one by mouth daily  Labs Reviewed: SGOT: 20 (01/30/2010)   SGPT: 20 (01/30/2010)  Prior 10 Yr Risk Heart Disease: 9 % (07/30/2008)   HDL:64.00 (01/30/2010), 62.10 (08/12/2009)  LDL:92 (01/30/2010), 90 (08/12/2009)  Chol:178 (01/30/2010), 167 (08/12/2009)  Trig:108.0 (01/30/2010), 77.0 (08/12/2009)  Problem # 3:  DIABETES MELLITUS, TYPE II (ICD-250.00) Assessment: Deteriorated DM is worse - disc need for wt loss and DM diet  pt declines DM classes but is going to get a magazine with ed in it also recommended sugarbusters book  opthy up to date disc DM / low glycemic diet made plan for exercise lab and f/u 3 mo  Her updated medication list for this problem includes:    Adult Aspirin Ec Low Strength 81 Mg Tbec (Aspirin) .Marland Kitchen... Take one by mouth daily    Lisinopril 5 Mg Tabs (Lisinopril) .Marland Kitchen... 1 by mouth once daily  Complete Medication List: 1)  Hydrochlorothiazide 25 Mg Tabs (Hydrochlorothiazide) .... Take one by  mouth daily 2)  Gemfibrozil 600 Mg Tabs (Gemfibrozil) .... Take one by mouth twice a day 3)  Adult Aspirin Ec Low Strength 81 Mg Tbec (Aspirin) .Marland KitchenMarland KitchenMarland Kitchen  Take one by mouth daily 4)  Vitamin C 1000 Mg Tabs (Ascorbic acid) .... Take one by mouth daily 5)  Lovastatin 20 Mg Tabs (Lovastatin) .... Take one by mouth daily 6)  Vitamin D 1000 Unit Tabs (Cholecalciferol) .... One daily by mouth 7)  Fish Oil 1000 Mg Caps (Omega-3 fatty acids) .... Take 2  by mouth once daily 8)  Calcium-vitamin D 250-125 Mg-unit Tabs (Calcium carbonate-vitamin d) .... Take 2 by mouth once daily 9)  Biotin 10 Mg Tabs (Biotin) .... Take 1 by mouth once daily 10)  Cranberry 400 Mg Caps (Cranberry) .... Take 2  by mouth once daily 11)  Lisinopril 5 Mg Tabs (Lisinopril) .Marland Kitchen.. 1 by mouth once daily 12)  Tussionex Pennkinetic Er 8-10 Mg/46ml Lqcr (Chlorpheniramine-hydrocodone) .... 1/2 to 1 teaspoon by mouth up to two times a day as needed severe cough  watch for sedation  Patient Instructions: 1)  It is important that you exercise reguarly at least 20 minutes 5 times a week. If you develop chest pain, have severe difficulty breathing, or feel very tired, stop exercising immediately and seek medical attention.  2)  try to stick to a diabetic diet 3)  consider the "sugarbusters " book and use your magazine  4)  work on weight loss  5)  schedule non fasting labs 3 months for glucose and AIC 250.0 and then follow up   Orders Added: 1)  Est. Patient Level IV [16109]    Current Allergies (reviewed today): FOSAMAX LIPITOR PRAVACHOL

## 2010-03-25 ENCOUNTER — Encounter: Payer: Self-pay | Admitting: Family Medicine

## 2010-05-16 ENCOUNTER — Other Ambulatory Visit: Payer: Self-pay | Admitting: Family Medicine

## 2010-05-17 ENCOUNTER — Other Ambulatory Visit (INDEPENDENT_AMBULATORY_CARE_PROVIDER_SITE_OTHER): Payer: BC Managed Care – PPO | Admitting: Family Medicine

## 2010-05-17 DIAGNOSIS — E119 Type 2 diabetes mellitus without complications: Secondary | ICD-10-CM

## 2010-05-17 LAB — GLUCOSE, RANDOM: Glucose, Bld: 124 mg/dL — ABNORMAL HIGH (ref 70–99)

## 2010-05-18 LAB — HEMOGLOBIN A1C: Hgb A1c MFr Bld: 6.9 % — ABNORMAL HIGH (ref 4.6–6.5)

## 2010-05-22 ENCOUNTER — Encounter: Payer: Self-pay | Admitting: Family Medicine

## 2010-05-22 ENCOUNTER — Ambulatory Visit (INDEPENDENT_AMBULATORY_CARE_PROVIDER_SITE_OTHER): Payer: BC Managed Care – PPO | Admitting: Family Medicine

## 2010-05-22 DIAGNOSIS — I10 Essential (primary) hypertension: Secondary | ICD-10-CM

## 2010-05-22 DIAGNOSIS — E7800 Pure hypercholesterolemia, unspecified: Secondary | ICD-10-CM

## 2010-05-22 DIAGNOSIS — E119 Type 2 diabetes mellitus without complications: Secondary | ICD-10-CM

## 2010-05-22 MED ORDER — LOVASTATIN 20 MG PO TABS: 20.0000 mg | ORAL_TABLET | Freq: Every day | ORAL | Status: DC

## 2010-05-22 MED ORDER — HYDROCHLOROTHIAZIDE 25 MG PO TABS: 25.0000 mg | ORAL_TABLET | Freq: Every day | ORAL | Status: DC

## 2010-05-22 MED ORDER — LISINOPRIL 5 MG PO TABS: 5.0000 mg | ORAL_TABLET | Freq: Every day | ORAL | Status: DC

## 2010-05-22 MED ORDER — METFORMIN HCL 500 MG PO TABS: 500.0000 mg | ORAL_TABLET | Freq: Two times a day (BID) | ORAL | Status: DC

## 2010-05-22 MED ORDER — GEMFIBROZIL 600 MG PO TABS: 600.0000 mg | ORAL_TABLET | Freq: Two times a day (BID) | ORAL | Status: DC

## 2010-05-22 NOTE — Progress Notes (Signed)
  Subjective:    Patient ID: Amy Fry, female    DOB: 1945/04/27, 65 y.o.   MRN: [id number]  HPI Here for f/u of DM and HTN   Feeling good- nothing new going on   Wt is stable with bmi of 34  HTN in good control 110/76 No cp or edema or headaches  a1c is higher at 6.9 up from 6.6 Diet - is fair -- not really good / did not get sugar busters book  Is interested in starting medicine for DM  Too busy to change her lifestyle Does not eat sweets  Too many starches  Needs to make it a priority  Has a meter -- and checks her sugar once in a while -- is too high - usually in 150-160s  opthy-- goes in sept- is up to date  Is on ace  Past Medical History  Diagnosis Date  . Diabetes mellitus     type II  . Hypertension   . Osteopenia   . Early cataracts, bilateral     no sx yet   . Hyperlipidemia       Review of Systems Review of Systems  Constitutional: Negative for fever, appetite change, fatigue and unexpected weight change.  Eyes: Negative for pain and visual disturbance.  Respiratory: Negative for cough and shortness of breath.   Cardiovascular: Negative for cp or sob or edema  Gastrointestinal: Negative for nausea, diarrhea and constipation.  Genitourinary: Negative for urgency and frequency.  Skin: Negative for pallor.  Neurological: Negative for weakness, light-headedness, numbness and headaches.  Hematological: Negative for adenopathy. Does not bruise/bleed easily.  Psychiatric/Behavioral: Negative for dysphoric mood. The patient is not nervous/anxious.          Objective:   Physical Exam  Constitutional: She appears well-developed and well-nourished.  HENT:  Head: Normocephalic and atraumatic.  Eyes: Conjunctivae and EOM are normal. Pupils are equal, round, and reactive to light.  Neck: Normal range of motion. Neck supple. No JVD present. Carotid bruit is not present. No thyromegaly present.  Cardiovascular: Normal rate, regular rhythm and normal heart  sounds.   Pulmonary/Chest: Effort normal and breath sounds normal. No respiratory distress. She has no wheezes.  Abdominal: Soft. Bowel sounds are normal. She exhibits no distension and no mass. There is no tenderness.  Musculoskeletal: She exhibits no edema and no tenderness.  Lymphadenopathy:    She has no cervical adenopathy.  Neurological: She is alert. She displays normal reflexes. No cranial nerve deficit.  Skin: Skin is warm and dry. No rash noted. No erythema.  Psychiatric: She has a normal mood and affect.          Assessment & Plan:

## 2010-05-22 NOTE — Assessment & Plan Note (Addendum)
Long disc about diet and exercise which she had not prioritized Currently uncontrolled   Disc imp of this and wt loss Declines DM teaching due to lack of time Start metformin 500 bid Check sugar bid - disc paramters Low glycemic diet/ sugarbusters  F/u after lab in 3 mo

## 2010-05-22 NOTE — Assessment & Plan Note (Signed)
Good control  On ace

## 2010-05-22 NOTE — Patient Instructions (Signed)
Start metformin twice daily Update me if any side effects Goal sugar is 120 or below in am and 140 or below 2 hours after a meal  Avoid sweets and sweet drinks and juice Eat small servings of carbohydrates Book to buy in "sugarbusters" Aim for at least 20 minutes of exercise 5 days per week  Hopefully this will result in some weight loss Fasting lab in 3 months and then follow up  Keep a book of sugars- will review them at that time too

## 2010-08-17 ENCOUNTER — Other Ambulatory Visit (INDEPENDENT_AMBULATORY_CARE_PROVIDER_SITE_OTHER): Payer: BC Managed Care – PPO

## 2010-08-17 DIAGNOSIS — I10 Essential (primary) hypertension: Secondary | ICD-10-CM

## 2010-08-17 DIAGNOSIS — E119 Type 2 diabetes mellitus without complications: Secondary | ICD-10-CM

## 2010-08-17 DIAGNOSIS — E7800 Pure hypercholesterolemia, unspecified: Secondary | ICD-10-CM

## 2010-08-17 LAB — RENAL FUNCTION PANEL
Albumin: 4.6 g/dL (ref 3.5–5.2)
BUN: 19 mg/dL (ref 6–23)
CO2: 27 mEq/L (ref 19–32)
Calcium: 9.8 mg/dL (ref 8.4–10.5)
Chloride: 100 mEq/L (ref 96–112)
Potassium: 4 mEq/L (ref 3.5–5.1)

## 2010-08-17 LAB — LIPID PANEL: Total CHOL/HDL Ratio: 3

## 2010-08-22 ENCOUNTER — Encounter: Payer: Self-pay | Admitting: Family Medicine

## 2010-08-22 ENCOUNTER — Ambulatory Visit (INDEPENDENT_AMBULATORY_CARE_PROVIDER_SITE_OTHER): Payer: BC Managed Care – PPO | Admitting: Family Medicine

## 2010-08-22 DIAGNOSIS — I10 Essential (primary) hypertension: Secondary | ICD-10-CM

## 2010-08-22 DIAGNOSIS — E119 Type 2 diabetes mellitus without complications: Secondary | ICD-10-CM

## 2010-08-22 DIAGNOSIS — E7800 Pure hypercholesterolemia, unspecified: Secondary | ICD-10-CM

## 2010-08-22 NOTE — Patient Instructions (Addendum)
Weight is down and bp is good  Cholesterol well controlled  Diabetes improved Keep up the good work ! Schedule fasting labs and then PE in 6 months

## 2010-08-22 NOTE — Assessment & Plan Note (Signed)
Very good control - to goal with LDL under 80 - mevacor and diet  Rev low sat fat diet  Rev goals

## 2010-08-22 NOTE — Progress Notes (Signed)
Subjective:    Patient ID: Amy Fry, female    DOB: [birth year], 65 y.o.   MRN: [id number]  HPI Here for f/u of DM   a1c is down from 6.6 to 6.3 Wt is down 7 lb-- has been walking  In very good control by a1c  Checks sugars - and her am sugars were 140-150s , -- now 120s and 130s  Is also eating better -- "tries" and will not explain what she means Is staying away from sweets  Does not watch her portions well enough Has a book - ? Sugar busters  Does not want to go to DM teaching     Lipids well controlled  Lab Results  Component Value Date   CHOL 161 08/17/2010   CHOL 178 01/30/2010   CHOL 167 08/12/2009   Lab Results  Component Value Date   HDL 58.40 08/17/2010   HDL 64.00 01/30/2010   HDL 91.47 08/12/2009   Lab Results  Component Value Date   LDLCALC 79 08/17/2010   LDLCALC 92 01/30/2010   LDLCALC 90 08/12/2009   Lab Results  Component Value Date   TRIG 116.0 08/17/2010   TRIG 108.0 01/30/2010   TRIG 77.0 08/12/2009   Lab Results  Component Value Date   CHOLHDL 3 08/17/2010   CHOLHDL 3 01/30/2010   CHOLHDL 3 08/12/2009   No results found for this basename: LDLDIRECT    No fatty or fried foods  More fruits and vegetables in general   HTN in good control No problems with current meds- tolerates ace well No cp or sob or edema or headaches   Patient Active Problem List  Diagnoses  . DERMATOPHYTOSIS, NAIL  . DIABETES MELLITUS, TYPE II  . UNSPECIFIED VITAMIN D DEFICIENCY  . HYPERCHOLESTEROLEMIA  . HYPERTENSION  . OSTEOPENIA  . URINARY TRACT INFECTION, HX OF   Past Medical History  Diagnosis Date  . Diabetes mellitus     type II  . Hypertension   . Osteopenia   . Early cataracts, bilateral     no sx yet   . Hyperlipidemia    Past Surgical History  Procedure Date  . Cholecystectomy   . Oophorectomy   . Cystoscopy 10-1999  . Fracture surgery 2010    rib fx- from fall    History  Substance Use Topics  . Smoking status: Never Smoker   . Smokeless  tobacco: Not on file  . Alcohol Use: Not on file   Family History  Problem Relation Age of Onset  . Heart attack Mother   . Depression Sister   . Heart attack Brother   . Stroke Brother    Allergies  Allergen Reactions  . Alendronate Sodium     REACTION: reaction not known  . Atorvastatin     REACTION: headaches  . Pravastatin Sodium     REACTION: headaches and arm pain   Current Outpatient Prescriptions on File Prior to Visit  Medication Sig Dispense Refill  . Ascorbic Acid (VITAMIN C) 1000 MG tablet Take 1,000 mg by mouth daily.        Marland Kitchen aspirin 81 MG EC tablet Take 81 mg by mouth daily.        . Biotin 10 MG TABS Take by mouth daily.        . calcium-vitamin D (OSCAL) 250-125 MG-UNIT per tablet 2 by mouth once daily      . cholecalciferol (VITAMIN D) 1000 UNITS tablet Take 1,000 Units by mouth daily.        Marland Kitchen  Cranberry 400 MG CAPS 2 by mouth once daily      . hydrochlorothiazide 25 MG tablet Take 1 tablet (25 mg total) by mouth daily.  30 tablet  11  . lisinopril (PRINIVIL,ZESTRIL) 5 MG tablet Take 1 tablet (5 mg total) by mouth daily.  30 tablet  11  . lovastatin (MEVACOR) 20 MG tablet Take 1 tablet (20 mg total) by mouth daily.  30 tablet  11  . metFORMIN (GLUCOPHAGE) 500 MG tablet Take 1 tablet (500 mg total) by mouth 2 (two) times daily with a meal.  60 tablet  11  . Omega-3 Fatty Acids (FISH OIL) 1000 MG CAPS 2 by mouth once daily         Review of Systems Review of Systems  Constitutional: Negative for fever, appetite change, fatigue and unexpected weight change.  Eyes: Negative for pain and visual disturbance.  Respiratory: Negative for cough and shortness of breath.   Cardiovascular: Negative.  For cp or ha or palpitations Gastrointestinal: Negative for nausea, diarrhea and constipation.  Genitourinary: Negative for urgency and frequency.  Skin: Negative for pallor. or rash  Neurological: Negative for weakness, light-headedness, numbness and headaches.    Hematological: Negative for adenopathy. Does not bruise/bleed easily.  Psychiatric/Behavioral: Negative for dysphoric mood. The patient is not nervous/anxious.          Objective:   Physical Exam  Constitutional: She appears well-developed and well-nourished. No distress.       overwt and well appearing   HENT:  Head: Normocephalic and atraumatic.  Right Ear: External ear normal.  Left Ear: External ear normal.  Eyes: Conjunctivae and EOM are normal. Pupils are equal, round, and reactive to light. Right eye exhibits no discharge. Left eye exhibits no discharge.  Neck: Normal range of motion. Neck supple. No JVD present. Carotid bruit is not present. No thyromegaly present.  Cardiovascular: Normal rate, regular rhythm, normal heart sounds and intact distal pulses.   No murmur heard. Pulmonary/Chest: Effort normal and breath sounds normal. No respiratory distress. She has no wheezes.  Abdominal: Soft. Bowel sounds are normal. She exhibits no distension, no abdominal bruit and no mass. There is no tenderness.  Musculoskeletal: She exhibits no edema and no tenderness.  Lymphadenopathy:    She has no cervical adenopathy.  Neurological: She is alert. She has normal reflexes. No cranial nerve deficit. Coordination normal.  Skin: Skin is warm and dry. No rash noted. No erythema. No pallor.  Psychiatric: She has a normal mood and affect.          Assessment & Plan:

## 2010-08-22 NOTE — Assessment & Plan Note (Signed)
Good control No change Enc to keep walking

## 2010-08-22 NOTE — Assessment & Plan Note (Signed)
Improved with better diet and exercise No change in metformin  Disc further lifestyle change a1c is below 6.5 Lab 6 mo and f/u  utd opthy

## 2010-09-28 ENCOUNTER — Encounter: Payer: Self-pay | Admitting: Family Medicine

## 2010-09-28 ENCOUNTER — Ambulatory Visit (INDEPENDENT_AMBULATORY_CARE_PROVIDER_SITE_OTHER): Payer: BC Managed Care – PPO | Admitting: Family Medicine

## 2010-09-28 VITALS — BP 122/82 | HR 83 | Temp 98.5°F | Wt 194.8 lb

## 2010-09-28 DIAGNOSIS — J069 Acute upper respiratory infection, unspecified: Secondary | ICD-10-CM

## 2010-09-28 MED ORDER — AMOXICILLIN 500 MG PO CAPS: 500.0000 mg | ORAL_CAPSULE | Freq: Two times a day (BID) | ORAL | Status: AC

## 2010-09-28 NOTE — Progress Notes (Deleted)
  Subjective:    Patient ID: Amy Fry, female    DOB: [birth year], 65 y.o.   MRN: [id number]  HPI    Review of Systems     Objective:   Physical Exam        Assessment & Plan:

## 2010-09-28 NOTE — Patient Instructions (Signed)
Take antibiotic as directed if symptoms worsen over the weekend.  Drink lots of fluids.  Treat sympotmatically with Mucinex, nasal saline irrigation, and Tylenol/Ibuprofen. Also try claritin  or zyrtec  over the counter- two times a day as needed. You can use warm compresses.  Cough suppressant at night. Call if not improving as expected in 5-7 days.

## 2010-09-28 NOTE — Progress Notes (Signed)
SUBJECTIVE:  Amy Fry is a 65 y.o. female who complains of coryza, congestion, sneezing and sore throat for 7 days. She denies a history of anorexia, chest pain, chills, dizziness and fatigue and denies a history of asthma. Patient denies smoke cigarettes.   Patient Active Problem List  Diagnoses  . DERMATOPHYTOSIS, NAIL  . DIABETES MELLITUS, TYPE II  . UNSPECIFIED VITAMIN D DEFICIENCY  . HYPERCHOLESTEROLEMIA  . HYPERTENSION  . OSTEOPENIA  . URINARY TRACT INFECTION, HX OF  . URI (upper respiratory infection)   Past Medical History  Diagnosis Date  . Diabetes mellitus     type II  . Hypertension   . Osteopenia   . Early cataracts, bilateral     no sx yet   . Hyperlipidemia    Past Surgical History  Procedure Date  . Cholecystectomy   . Oophorectomy   . Cystoscopy 10-1999  . Fracture surgery 2010    rib fx- from fall    History  Substance Use Topics  . Smoking status: Never Smoker   . Smokeless tobacco: Not on file  . Alcohol Use: Not on file   Family History  Problem Relation Age of Onset  . Heart attack Mother   . Depression Sister   . Heart attack Brother   . Stroke Brother    Allergies  Allergen Reactions  . Alendronate Sodium     REACTION: reaction not known  . Atorvastatin     REACTION: headaches  . Pravastatin Sodium     REACTION: headaches and arm pain   Current Outpatient Prescriptions on File Prior to Visit  Medication Sig Dispense Refill  . Ascorbic Acid (VITAMIN C) 1000 MG tablet Take 1,000 mg by mouth daily.        Marland Kitchen aspirin 81 MG EC tablet Take 81 mg by mouth daily.        . Biotin 10 MG TABS Take by mouth daily.        . calcium-vitamin D (OSCAL) 250-125 MG-UNIT per tablet 2 by mouth once daily      . cholecalciferol (VITAMIN D) 1000 UNITS tablet Take 1,000 Units by mouth daily.        . Cranberry 400 MG CAPS 2 by mouth once daily      . gemfibrozil (LOPID) 600 MG tablet Take 600 mg by mouth 2 (two) times daily.        .  hydrochlorothiazide 25 MG tablet Take 1 tablet (25 mg total) by mouth daily.  30 tablet  11  . lisinopril (PRINIVIL,ZESTRIL) 5 MG tablet Take 1 tablet (5 mg total) by mouth daily.  30 tablet  11  . lovastatin (MEVACOR) 20 MG tablet Take 1 tablet (20 mg total) by mouth daily.  30 tablet  11  . metFORMIN (GLUCOPHAGE) 500 MG tablet Take 1 tablet (500 mg total) by mouth 2 (two) times daily with a meal.  60 tablet  11  . Omega-3 Fatty Acids (FISH OIL) 1000 MG CAPS 2 by mouth once daily        OBJECTIVE: BP 122/82  Pulse 83  Temp(Src) 98.5 F (36.9 C) (Oral)  Wt 194 lb 12 oz (88.338 kg)  She appears well, vital signs are as noted. Ears normal.  Throat and pharynx normal.  Neck supple. No adenopathy in the neck. Nose is congested. Sinuses non tender. The chest is clear, without wheezes or rales.  ASSESSMENT:  viral upper respiratory illness  PLAN: Symptomatic therapy suggested: push fluids, rest and return  office visit prn if symptoms persist or worsen. Lack of antibiotic effectiveness discussed with her.  I did give her an rx for amoxicillin to fill if symptoms worsen over the weekend since [place] is [place]. The patient indicates understanding of these issues and agrees with the plan.   Call or return to clinic prn if these symptoms worsen or fail to improve as anticipated.

## 2010-10-16 LAB — HM DIABETES EYE EXAM: HM Diabetic Eye Exam: NORMAL

## 2011-02-18 ENCOUNTER — Telehealth: Payer: Self-pay | Admitting: Family Medicine

## 2011-02-18 DIAGNOSIS — E7800 Pure hypercholesterolemia, unspecified: Secondary | ICD-10-CM

## 2011-02-18 DIAGNOSIS — Z0000 Encounter for general adult medical examination without abnormal findings: Secondary | ICD-10-CM | POA: Insufficient documentation

## 2011-02-18 DIAGNOSIS — E559 Vitamin D deficiency, unspecified: Secondary | ICD-10-CM

## 2011-02-18 DIAGNOSIS — E119 Type 2 diabetes mellitus without complications: Secondary | ICD-10-CM

## 2011-02-18 NOTE — Telephone Encounter (Signed)
Message copied by Judy Pimple on Sun Feb 18, 2011  2:02 PM ------      Message from: Baldomero Lamy      Created: Wed Feb 07, 2011 11:35 AM      Regarding: Cpx labs Mon 2/4       Please order  future cpx labs for pt's upcomming lab appt.      Thanks      Rodney Booze

## 2011-02-19 ENCOUNTER — Other Ambulatory Visit: Payer: BC Managed Care – PPO

## 2011-02-26 ENCOUNTER — Encounter: Payer: BC Managed Care – PPO | Admitting: Family Medicine

## 2011-04-23 ENCOUNTER — Encounter: Payer: Self-pay | Admitting: Family Medicine

## 2011-04-23 ENCOUNTER — Other Ambulatory Visit: Payer: Self-pay

## 2011-04-23 ENCOUNTER — Ambulatory Visit (INDEPENDENT_AMBULATORY_CARE_PROVIDER_SITE_OTHER): Payer: BC Managed Care – PPO | Admitting: Family Medicine

## 2011-04-23 VITALS — BP 140/78 | HR 67 | Temp 98.2°F | Ht 64.25 in | Wt 196.5 lb

## 2011-04-23 DIAGNOSIS — J019 Acute sinusitis, unspecified: Secondary | ICD-10-CM

## 2011-04-23 MED ORDER — METFORMIN HCL 500 MG PO TABS: 500.0000 mg | ORAL_TABLET | Freq: Two times a day (BID) | ORAL | Status: DC

## 2011-04-23 MED ORDER — AMOXICILLIN-POT CLAVULANATE 875-125 MG PO TABS: 1.0000 | ORAL_TABLET | Freq: Two times a day (BID) | ORAL | Status: AC

## 2011-04-23 NOTE — Patient Instructions (Signed)
Drink lots of fluids Take the augmentin as directed Try nasal saline spray for congestion Also plain mucinex is ok Update if not starting to improve in a week or if worsening

## 2011-04-23 NOTE — Telephone Encounter (Signed)
[facility] faxed request Metformin 500 mg #60 x 11.

## 2011-04-23 NOTE — Progress Notes (Signed)
Subjective:    Patient ID: Amy Fry, female    DOB: 1945/11/12, 66 y.o.   MRN: [id number]  HPI Is here with sinus congestion and pain -suspects infection Symptoms on and off for over a month Clear and yellow/ green mucous  No fever / no chills or aches  Throat is scratchy from drainage  Ears are ok   Tried zyrtec otc -- made her sleepy and did not like it - too sedating   Does not usually have allergies   Patient Active Problem List  Diagnoses  . DERMATOPHYTOSIS, NAIL  . DIABETES MELLITUS, TYPE II  . UNSPECIFIED VITAMIN D DEFICIENCY  . HYPERCHOLESTEROLEMIA  . HYPERTENSION  . OSTEOPENIA  . Routine general medical examination at a health care facility  . Acute bacterial sinusitis   Past Medical History  Diagnosis Date  . Diabetes mellitus     type II  . Hypertension   . Osteopenia   . Early cataracts, bilateral     no sx yet   . Hyperlipidemia    Past Surgical History  Procedure Date  . Cholecystectomy   . Oophorectomy   . Cystoscopy 10-1999  . Fracture surgery 2010    rib fx- from fall    History  Substance Use Topics  . Smoking status: Never Smoker   . Smokeless tobacco: Not on file  . Alcohol Use: Not on file   Family History  Problem Relation Age of Onset  . Heart attack Mother   . Depression Sister   . Heart attack Brother   . Stroke Brother    Allergies  Allergen Reactions  . Alendronate Sodium     REACTION: reaction not known  . Atorvastatin     REACTION: headaches  . Pravastatin Sodium     REACTION: headaches and arm pain   Current Outpatient Prescriptions on File Prior to Visit  Medication Sig Dispense Refill  . Ascorbic Acid (VITAMIN C) 1000 MG tablet Take 1,000 mg by mouth daily.        Marland Kitchen aspirin 81 MG EC tablet Take 81 mg by mouth daily.        . Biotin 10 MG TABS Take by mouth daily.        . calcium-vitamin D (OSCAL) 250-125 MG-UNIT per tablet 2 by mouth once daily      . cholecalciferol (VITAMIN D) 1000 UNITS tablet Take  1,000 Units by mouth daily.        . Cranberry 400 MG CAPS 2 by mouth once daily      . gemfibrozil (LOPID) 600 MG tablet Take 600 mg by mouth 2 (two) times daily.        . hydrochlorothiazide 25 MG tablet Take 1 tablet (25 mg total) by mouth daily.  30 tablet  11  . lisinopril (PRINIVIL,ZESTRIL) 5 MG tablet Take 1 tablet (5 mg total) by mouth daily.  30 tablet  11  . lovastatin (MEVACOR) 20 MG tablet Take 1 tablet (20 mg total) by mouth daily.  30 tablet  11  . Omega-3 Fatty Acids (FISH OIL) 1000 MG CAPS 2 by mouth once daily      . DISCONTD: metFORMIN (GLUCOPHAGE) 500 MG tablet Take 1 tablet (500 mg total) by mouth 2 (two) times daily with a meal.  60 tablet  11       Review of Systems Review of Systems  Constitutional: Negative for fever, appetite change,  and unexpected weight change. pos for fatigue  Eyes: Negative for pain  and visual disturbance ENT pos for congestion and sinus pain , neg for st.  Respiratory: Negative for wheeze or sob  Cardiovascular: Negative for cp or palpitations    Gastrointestinal: Negative for nausea, diarrhea and constipation.  Genitourinary: Negative for urgency and frequency.  Skin: Negative for pallor or rash   Neurological: Negative for weakness, light-headedness, numbness and headaches.  Hematological: Negative for adenopathy. Does not bruise/bleed easily.  Psychiatric/Behavioral: Negative for dysphoric mood. The patient is not nervous/anxious.          Objective:   Physical Exam  Constitutional: She appears well-developed and well-nourished. No distress.  HENT:  Head: Normocephalic and atraumatic.  Right Ear: External ear normal.  Left Ear: External ear normal.  Mouth/Throat: Oropharynx is clear and moist. No oropharyngeal exudate.       Nares are injected and congested  bilat frontal and ethmoid sinus tenderness   Eyes: Conjunctivae and EOM are normal. Pupils are equal, round, and reactive to light. Right eye exhibits no discharge. Left  eye exhibits no discharge.  Neck: Normal range of motion. Neck supple. No thyromegaly present.  Cardiovascular: Normal rate, regular rhythm and normal heart sounds.   Pulmonary/Chest: Effort normal and breath sounds normal. No respiratory distress. She has no wheezes.  Lymphadenopathy:    She has no cervical adenopathy.  Neurological: She is alert.  Skin: Skin is warm and dry. No rash noted. No erythema. No pallor.  Psychiatric: She has a normal mood and affect.          Assessment & Plan:

## 2011-04-23 NOTE — Assessment & Plan Note (Signed)
After a month of congestion- poss following a cold Will cover with augmentin Disc symptomatic care - see instructions on AVS  Will try nasal saline Update if not starting to improve in a week or if worsening

## 2011-05-14 ENCOUNTER — Emergency Department (HOSPITAL_COMMUNITY): Payer: BC Managed Care – PPO

## 2011-05-14 ENCOUNTER — Encounter (HOSPITAL_COMMUNITY): Payer: Self-pay | Admitting: Emergency Medicine

## 2011-05-14 ENCOUNTER — Emergency Department (HOSPITAL_COMMUNITY)
Admission: EM | Admit: 2011-05-14 | Discharge: 2011-05-14 | Disposition: A | Discharge status: Home / Self Care | Payer: BC Managed Care – PPO | Attending: Emergency Medicine | Admitting: Emergency Medicine

## 2011-05-14 DIAGNOSIS — E119 Type 2 diabetes mellitus without complications: Secondary | ICD-10-CM | POA: Insufficient documentation

## 2011-05-14 DIAGNOSIS — N390 Urinary tract infection, site not specified: Secondary | ICD-10-CM | POA: Insufficient documentation

## 2011-05-14 DIAGNOSIS — Z79899 Other long term (current) drug therapy: Secondary | ICD-10-CM | POA: Insufficient documentation

## 2011-05-14 DIAGNOSIS — I10 Essential (primary) hypertension: Secondary | ICD-10-CM | POA: Insufficient documentation

## 2011-05-14 DIAGNOSIS — E785 Hyperlipidemia, unspecified: Secondary | ICD-10-CM | POA: Insufficient documentation

## 2011-05-14 DIAGNOSIS — R109 Unspecified abdominal pain: Secondary | ICD-10-CM | POA: Insufficient documentation

## 2011-05-14 DIAGNOSIS — Z7982 Long term (current) use of aspirin: Secondary | ICD-10-CM | POA: Insufficient documentation

## 2011-05-14 DIAGNOSIS — R079 Chest pain, unspecified: Secondary | ICD-10-CM | POA: Insufficient documentation

## 2011-05-14 DIAGNOSIS — R0781 Pleurodynia: Secondary | ICD-10-CM

## 2011-05-14 DIAGNOSIS — IMO0001 Reserved for inherently not codable concepts without codable children: Secondary | ICD-10-CM | POA: Insufficient documentation

## 2011-05-14 LAB — URINALYSIS, ROUTINE W REFLEX MICROSCOPIC
Glucose, UA: NEGATIVE mg/dL
Protein, ur: NEGATIVE mg/dL
pH: 7 (ref 5.0–8.0)

## 2011-05-14 LAB — URINE MICROSCOPIC-ADD ON

## 2011-05-14 LAB — GLUCOSE, CAPILLARY: Glucose-Capillary: 128 mg/dL — ABNORMAL HIGH (ref 70–99)

## 2011-05-14 MED ORDER — TRAMADOL HCL 50 MG PO TABS
50.0000 mg | ORAL_TABLET | Freq: Once | ORAL | Status: AC
  Administered 2011-05-14: 50 mg via ORAL
  Filled 2011-05-14: qty 1

## 2011-05-14 MED ORDER — METHOCARBAMOL 500 MG PO TABS
1000.0000 mg | ORAL_TABLET | Freq: Once | ORAL | Status: AC
  Administered 2011-05-14: 1000 mg via ORAL
  Filled 2011-05-14: qty 2

## 2011-05-14 MED ORDER — IBUPROFEN 200 MG PO TABS
600.0000 mg | ORAL_TABLET | Freq: Once | ORAL | Status: AC
  Administered 2011-05-14: 600 mg via ORAL
  Filled 2011-05-14: qty 3

## 2011-05-14 MED ORDER — IBUPROFEN 600 MG PO TABS: 600.0000 mg | ORAL_TABLET | Freq: Four times a day (QID) | ORAL | Status: AC | PRN

## 2011-05-14 MED ORDER — METHOCARBAMOL 500 MG PO TABS: 500.0000 mg | ORAL_TABLET | Freq: Two times a day (BID) | ORAL | Status: AC

## 2011-05-14 MED ORDER — TRAMADOL HCL 50 MG PO TABS: 50.0000 mg | ORAL_TABLET | Freq: Four times a day (QID) | ORAL | Status: AC | PRN

## 2011-05-14 MED ORDER — SULFAMETHOXAZOLE-TRIMETHOPRIM 800-160 MG PO TABS: 1.0000 | ORAL_TABLET | Freq: Two times a day (BID) | ORAL | Status: AC

## 2011-05-14 NOTE — ED Notes (Signed)
Left flank pain  Since sat has gotten worse hurts to take a deep breath states having rib pain she states no cp  Or left arm pain twisting pain  Has gotten worse

## 2011-05-14 NOTE — ED Notes (Signed)
Patient presents with left side/flank pain x several days intermittently. Patient denise dysuria, hematuria, frequency, urgency or recent injury.  Patient states "it feels like something is twisting on my side."

## 2011-05-14 NOTE — ED Provider Notes (Signed)
History     CSN: [id number]  Arrival date & time 05/14/11  1032   First MD Initiated Contact with Patient 05/14/11 1158      Chief Complaint  Patient presents with  . Flank Pain    (Consider location/radiation/quality/duration/timing/severity/associated sxs/prior treatment) HPI Pt has L rib pain x 3 days. States she was lifting ice chest on Friday and noticed pain on Saturday. Pain is persistent. She has taken no OTC meds. No SOB, cough, chest pain, known trauma, fever, chills. Pain is positional and worse with palpation.  Past Medical History  Diagnosis Date  . Diabetes mellitus     type II  . Hypertension   . Osteopenia   . Early cataracts, bilateral     no sx yet   . Hyperlipidemia     Past Surgical History  Procedure Date  . Cholecystectomy   . Oophorectomy   . Cystoscopy 10-1999  . Fracture surgery 2010    rib fx- from fall     Family History  Problem Relation Age of Onset  . Heart attack Mother   . Depression Sister   . Heart attack Brother   . Stroke Brother     History  Substance Use Topics  . Smoking status: Never Smoker   . Smokeless tobacco: Not on file  . Alcohol Use: Not on file    OB History    Grav Para Term Preterm Abortions TAB SAB Ect Mult Living                  Review of Systems  Constitutional: Negative for fever and chills.  HENT: Negative for neck pain.   Respiratory: Negative for chest tightness, shortness of breath and wheezing.   Cardiovascular: Negative for chest pain, palpitations and leg swelling.  Gastrointestinal: Negative for nausea, vomiting, abdominal pain and diarrhea.  Genitourinary: Positive for flank pain. Negative for dysuria, hematuria and difficulty urinating.  Musculoskeletal: Positive for myalgias. Negative for joint swelling and gait problem.  Skin: Negative for pallor, rash and wound.  Neurological: Negative for dizziness, syncope, weakness, numbness and headaches.    Allergies  Alendronate sodium;  Atorvastatin; and Pravastatin sodium  Home Medications   Current Outpatient Rx  Name Route Sig Dispense Refill  . VITAMIN C 1000 MG PO TABS Oral Take 1,000 mg by mouth daily.      . ASPIRIN 81 MG PO TBEC Oral Take 81 mg by mouth daily.      Marland Kitchen BIOTIN 10 MG PO TABS Oral Take by mouth daily.      Marland Kitchen CALCIUM-VITAMIN D 250-125 MG-UNIT PO TABS  2 by mouth once daily    . VITAMIN D 1000 UNITS PO TABS Oral Take 1,000 Units by mouth daily.      Marland Kitchen CRANBERRY 400 MG PO CAPS  2 by mouth once daily    . GEMFIBROZIL 600 MG PO TABS Oral Take 600 mg by mouth 2 (two) times daily.      Marland Kitchen HYDROCHLOROTHIAZIDE 25 MG PO TABS Oral Take 1 tablet (25 mg total) by mouth daily. 30 tablet 11  . LISINOPRIL 5 MG PO TABS Oral Take 1 tablet (5 mg total) by mouth daily. 30 tablet 11  . LOVASTATIN 20 MG PO TABS Oral Take 1 tablet (20 mg total) by mouth daily. 30 tablet 11  . METFORMIN HCL 500 MG PO TABS Oral Take 1 tablet (500 mg total) by mouth 2 (two) times daily with a meal. 60 tablet 11  . FISH OIL  1000 MG PO CAPS  2 by mouth once daily    . IBUPROFEN 600 MG PO TABS Oral Take 1 tablet (600 mg total) by mouth every 6 (six) hours as needed for pain. 30 tablet 0  . METHOCARBAMOL 500 MG PO TABS Oral Take 1 tablet (500 mg total) by mouth 2 (two) times daily. 20 tablet 0  . SULFAMETHOXAZOLE-TRIMETHOPRIM 800-160 MG PO TABS Oral Take 1 tablet by mouth 2 (two) times daily. 14 tablet 0  . TRAMADOL HCL 50 MG PO TABS Oral Take 1 tablet (50 mg total) by mouth every 6 (six) hours as needed for pain. 15 tablet 0    BP 122/78  Pulse 67  Temp(Src) 98.2 F (36.8 C) (Oral)  Resp 18  SpO2 95%  Physical Exam  Nursing note and vitals reviewed. Constitutional: She is oriented to person, place, and time. She appears well-developed and well-nourished. No distress.  HENT:  Head: Normocephalic and atraumatic.  Mouth/Throat: Oropharynx is clear and moist.  Eyes: EOM are normal. Pupils are equal, round, and reactive to light.  Neck:  Normal range of motion. Neck supple.  Cardiovascular: Normal rate and regular rhythm.   Pulmonary/Chest: Effort normal and breath sounds normal. No respiratory distress. She has no wheezes. She has no rales. She exhibits tenderness (TTP over L inferior lateral rib. Pain reproduced completely with palpation. No deformity or evidence of trauma ).  Abdominal: Soft. Bowel sounds are normal. There is no tenderness. There is no rebound and no guarding.  Musculoskeletal: Normal range of motion. She exhibits no edema and no tenderness.       No flank tenderness  Neurological: She is alert and oriented to person, place, and time.  Skin: Skin is warm and dry. No rash noted. No erythema.  Psychiatric: She has a normal mood and affect. Her behavior is normal.    ED Course  Procedures (including critical care time)  Labs Reviewed  URINALYSIS, ROUTINE W REFLEX MICROSCOPIC - Abnormal; Notable for the following:    Hgb urine dipstick LARGE (*)    Leukocytes, UA MODERATE (*)    All other components within normal limits  GLUCOSE, CAPILLARY - Abnormal; Notable for the following:    Glucose-Capillary 128 (*)    All other components within normal limits  URINE MICROSCOPIC-ADD ON - Abnormal; Notable for the following:    Squamous Epithelial / LPF FEW (*)    Bacteria, UA FEW (*)    All other components within normal limits   Dg Ribs Unilateral W/chest Left  05/14/2011  *RADIOLOGY REPORT*  Clinical Data: Left side pain  LEFT RIBS AND CHEST - 3+ VIEW  Comparison: None.  Findings: Three views of the left ribs submitted for interpretation.  No acute infiltrate or pulmonary edema.  No left rib fracture is identified.  No diagnostic pneumothorax.  IMPRESSION: No active disease.  No left rib fracture.  No diagnostic pneumothorax.  Original Report Authenticated By: Natasha Mead, M.D.     1. Rib pain on left side   2. UTI (lower urinary tract infection)      Date: 05/14/2011  Rate: 78  Rhythm: normal sinus  rhythm  QRS Axis: normal  Intervals: normal  ST/T Wave abnormalities: nonspecific T wave changes  Conduction Disutrbances:none  Narrative Interpretation:   Old EKG Reviewed: unchanged    MDM          Loren Racer, MD 05/14/11 1420

## 2011-05-14 NOTE — Discharge Instructions (Signed)
Urinary Tract Infection Infections of the urinary tract can start in several places. A bladder infection (cystitis), a kidney infection (pyelonephritis), and a prostate infection (prostatitis) are different types of urinary tract infections (UTIs). They usually get better if treated with medicines (antibiotics) that kill germs. Take all the medicine until it is gone. You or your child may feel better in a few days, but TAKE ALL MEDICINE or the infection may not respond and may become more difficult to treat. HOME CARE INSTRUCTIONS   Drink enough water and fluids to keep the urine clear or pale yellow. Cranberry juice is especially recommended, in addition to large amounts of water.   Avoid caffeine, tea, and carbonated beverages. They tend to irritate the bladder.   Alcohol may irritate the prostate.   Only take over-the-counter or prescription medicines for pain, discomfort, or fever as directed by your caregiver.  To prevent further infections:  Empty the bladder often. Avoid holding urine for long periods of time.   After a bowel movement, women should cleanse from front to back. Use each tissue only once.   Empty the bladder before and after sexual intercourse.  FINDING OUT THE RESULTS OF YOUR TEST Not all test results are available during your visit. If your or your child's test results are not back during the visit, make an appointment with your caregiver to find out the results. Do not assume everything is normal if you have not heard from your caregiver or the medical facility. It is important for you to follow up on all test results. SEEK MEDICAL CARE IF:   There is back pain.   Your baby is older than 3 months with a rectal temperature of 100.5 F (38.1 C) or higher for more than 1 day.   Your or your child's problems (symptoms) are no better in 3 days. Return sooner if you or your child is getting worse.  SEEK IMMEDIATE MEDICAL CARE IF:   There is severe back pain or lower  abdominal pain.   You or your child develops chills.   You have a fever.   Your baby is older than 3 months with a rectal temperature of 102 F (38.9 C) or higher.   Your baby is 3 months old or younger with a rectal temperature of 100.4 F (38 C) or higher.   There is nausea or vomiting.   There is continued burning or discomfort with urination.  MAKE SURE YOU:   Understand these instructions.   Will watch your condition.   Will get help right away if you are not doing well or get worse.  Document Released: 10/11/2004 Document Revised: 12/21/2010 Document Reviewed: 05/16/2006 [employer] Patient Information 2012 [employer]. 

## 2011-05-29 ENCOUNTER — Other Ambulatory Visit (INDEPENDENT_AMBULATORY_CARE_PROVIDER_SITE_OTHER): Payer: BC Managed Care – PPO

## 2011-05-29 DIAGNOSIS — E559 Vitamin D deficiency, unspecified: Secondary | ICD-10-CM

## 2011-05-29 DIAGNOSIS — E7800 Pure hypercholesterolemia, unspecified: Secondary | ICD-10-CM

## 2011-05-29 DIAGNOSIS — E119 Type 2 diabetes mellitus without complications: Secondary | ICD-10-CM

## 2011-05-29 DIAGNOSIS — Z0000 Encounter for general adult medical examination without abnormal findings: Secondary | ICD-10-CM

## 2011-05-29 LAB — CBC WITH DIFFERENTIAL/PLATELET
Eosinophils Relative: 2.9 % (ref 0.0–5.0)
HCT: 37.9 % (ref 36.0–46.0)
Hemoglobin: 12.7 g/dL (ref 12.0–15.0)
Lymphs Abs: 1.9 10*3/uL (ref 0.7–4.0)
Monocytes Relative: 9.3 % (ref 3.0–12.0)
Neutro Abs: 2.2 10*3/uL (ref 1.4–7.7)
WBC: 4.7 10*3/uL (ref 4.5–10.5)

## 2011-05-29 LAB — HEMOGLOBIN A1C: Hgb A1c MFr Bld: 6.4 % (ref 4.6–6.5)

## 2011-05-29 LAB — TSH: TSH: 1.08 u[IU]/mL (ref 0.35–5.50)

## 2011-05-29 LAB — COMPREHENSIVE METABOLIC PANEL
CO2: 29 mEq/L (ref 19–32)
Calcium: 10.1 mg/dL (ref 8.4–10.5)
Creatinine, Ser: 0.7 mg/dL (ref 0.4–1.2)
GFR: 85.14 mL/min (ref >60.00)
Glucose, Bld: 105 mg/dL — ABNORMAL HIGH (ref 70–99)
Sodium: 142 mEq/L (ref 135–145)
Total Bilirubin: 0.4 mg/dL (ref 0.3–1.2)
Total Protein: 7.4 g/dL (ref 6.0–8.3)

## 2011-05-29 LAB — LIPID PANEL: HDL: 59.1 mg/dL (ref >39.00)

## 2011-06-05 ENCOUNTER — Other Ambulatory Visit: Payer: Self-pay

## 2011-06-05 ENCOUNTER — Encounter: Payer: Self-pay | Admitting: Family Medicine

## 2011-06-05 ENCOUNTER — Ambulatory Visit (INDEPENDENT_AMBULATORY_CARE_PROVIDER_SITE_OTHER): Payer: BC Managed Care – PPO | Admitting: Family Medicine

## 2011-06-05 VITALS — BP 130/70 | HR 74 | Temp 98.1°F | Ht 64.25 in | Wt 194.0 lb

## 2011-06-05 DIAGNOSIS — Z0000 Encounter for general adult medical examination without abnormal findings: Secondary | ICD-10-CM

## 2011-06-05 DIAGNOSIS — E559 Vitamin D deficiency, unspecified: Secondary | ICD-10-CM

## 2011-06-05 DIAGNOSIS — Z23 Encounter for immunization: Secondary | ICD-10-CM

## 2011-06-05 DIAGNOSIS — I10 Essential (primary) hypertension: Secondary | ICD-10-CM

## 2011-06-05 DIAGNOSIS — E119 Type 2 diabetes mellitus without complications: Secondary | ICD-10-CM

## 2011-06-05 DIAGNOSIS — M899 Disorder of bone, unspecified: Secondary | ICD-10-CM

## 2011-06-05 DIAGNOSIS — R300 Dysuria: Secondary | ICD-10-CM | POA: Insufficient documentation

## 2011-06-05 DIAGNOSIS — E7800 Pure hypercholesterolemia, unspecified: Secondary | ICD-10-CM

## 2011-06-05 LAB — POCT URINALYSIS DIPSTICK
Glucose, UA: NEGATIVE
Leukocytes, UA: NEGATIVE
Nitrite, UA: NEGATIVE
Spec Grav, UA: <=1.005
Urobilinogen, UA: 0.2

## 2011-06-05 MED ORDER — LOVASTATIN 20 MG PO TABS: 20.0000 mg | ORAL_TABLET | Freq: Every day | ORAL | Status: DC

## 2011-06-05 MED ORDER — GEMFIBROZIL 600 MG PO TABS: 600.0000 mg | ORAL_TABLET | Freq: Two times a day (BID) | ORAL | Status: DC

## 2011-06-05 MED ORDER — LISINOPRIL 5 MG PO TABS: 5.0000 mg | ORAL_TABLET | Freq: Every day | ORAL | Status: DC

## 2011-06-05 MED ORDER — HYDROCHLOROTHIAZIDE 25 MG PO TABS: 25.0000 mg | ORAL_TABLET | Freq: Every day | ORAL | Status: DC

## 2011-06-05 MED ORDER — METFORMIN HCL 500 MG PO TABS: 500.0000 mg | ORAL_TABLET | Freq: Two times a day (BID) | ORAL | Status: DC

## 2011-06-05 NOTE — Assessment & Plan Note (Signed)
Good control with current diet and mevacor Disc goals for lipids and reasons to control them Rev labs with pt Rev low sat fat diet in detail

## 2011-06-05 NOTE — Assessment & Plan Note (Signed)
Better D level Disc ca and exercise  Due for dexa -will put off until better ins

## 2011-06-05 NOTE — Telephone Encounter (Signed)
Pt request refills for Gemfibrozil 600 mg # 60 x 11, HCTZ 25 mg # 30 x 11,lisinopril 5 mg # 30 x 11,lovastatin 20 mg # 30 x 11 and metformin 500 mg # 60 x 11. Sent as requested to [facility] pharmacy. Pt notified while on phone.

## 2011-06-05 NOTE — Patient Instructions (Signed)
For the next week drink water only (no coffee/ tea/ juice or other beverages and avoid baths)  If urinary symptoms worsen or do not improve let me know  Do not forget to schedule your gyn appt  Keep working on healthy diet and exercise for continued weight loss  Tdap vaccine today  Labs are stable Follow up in about 6 months

## 2011-06-05 NOTE — Assessment & Plan Note (Signed)
bp in fair control at this time  No changes needed  Disc lifstyle change with low sodium diet and exercise   

## 2011-06-05 NOTE — Assessment & Plan Note (Signed)
Good control with metformin/ diet  opthy was in the fall  No changes F/u 6 mo Disc plan for wt loss

## 2011-06-05 NOTE — Assessment & Plan Note (Signed)
ua is clear  Enc to drink water only - no other beverages for 1 week - and update  ? Urethritis

## 2011-06-05 NOTE — Assessment & Plan Note (Signed)
Better with current supplementation and level of 53

## 2011-06-05 NOTE — Progress Notes (Signed)
Subjective:    Patient ID: Amy Fry, female    DOB: 30-Sep-1945, 66 y.o.   MRN: [id number]  HPI Here for health maintenance exam and to review chronic medical problems    Thought she may have uti  Is burning after urination  Is drinking coffee and other beverages No other symptoms  No frequency/ urgency/ incontinence/ hematuria  ua today is clear   Pap- gyn exam - was supposed to be in fall - missed it  Will re schedule it  She  Will do that   Mammogram was fall 2011 Self exam- no lumps    Tdap-- ? Last - needs one  Zoster status-never had the shingles vaccine- not interested  Flu shot - will not do - afraid of side effects    colonosc 03-- is due - but insurance will not pay so declines   bp is  130/70   Today No cp or palpitations or headaches or edema  No side effects to medicines  130/70  DM a1c 6.4 Metformin- no problems  opthy- exam within the past year- no vision problems Is watching diet for sugars    Osteopenia - last one was in 2011 - cannot afford one now  D level is 53  Wt is down 2 lb with bmi of 33   Is eating a healthy diet  Is getting more exercise - outdoors   Patient Active Problem List  Diagnoses  . DERMATOPHYTOSIS, NAIL  . DIABETES MELLITUS, TYPE II  . UNSPECIFIED VITAMIN D DEFICIENCY  . HYPERCHOLESTEROLEMIA  . HYPERTENSION  . OSTEOPENIA  . Routine general medical examination at a health care facility  . Acute bacterial sinusitis  . Dysuria   Past Medical History  Diagnosis Date  . Diabetes mellitus     type II  . Hypertension   . Osteopenia   . Early cataracts, bilateral     no sx yet   . Hyperlipidemia    Past Surgical History  Procedure Date  . Cholecystectomy   . Oophorectomy   . Cystoscopy 10-1999  . Fracture surgery 2010    rib fx- from fall    History  Substance Use Topics  . Smoking status: Never Smoker   . Smokeless tobacco: Not on file  . Alcohol Use: Not on file   Family History  Problem  Relation Age of Onset  . Heart attack Mother   . Depression Sister   . Heart attack Brother   . Stroke Brother    Allergies  Allergen Reactions  . Alendronate Sodium     REACTION: reaction not known  . Atorvastatin     REACTION: headaches  . Pravastatin Sodium     REACTION: headaches and arm pain   Current Outpatient Prescriptions on File Prior to Visit  Medication Sig Dispense Refill  . Ascorbic Acid (VITAMIN C) 1000 MG tablet Take 1,000 mg by mouth daily.        Marland Kitchen aspirin 81 MG EC tablet Take 81 mg by mouth daily.        . Biotin 10 MG TABS Take by mouth daily.        . calcium-vitamin D (OSCAL) 250-125 MG-UNIT per tablet 2 by mouth once daily      . cholecalciferol (VITAMIN D) 1000 UNITS tablet Take 1,000 Units by mouth daily.        . Cranberry 400 MG CAPS 2 by mouth once daily      . Omega-3 Fatty Acids (FISH OIL)  1000 MG CAPS 2 by mouth once daily      . gemfibrozil (LOPID) 600 MG tablet Take 1 tablet (600 mg total) by mouth 2 (two) times daily.  60 tablet  11  . hydrochlorothiazide (HYDRODIURIL) 25 MG tablet Take 1 tablet (25 mg total) by mouth daily.  30 tablet  11  . lisinopril (PRINIVIL,ZESTRIL) 5 MG tablet Take 1 tablet (5 mg total) by mouth daily.  30 tablet  11  . lovastatin (MEVACOR) 20 MG tablet Take 1 tablet (20 mg total) by mouth daily.  30 tablet  11  . metFORMIN (GLUCOPHAGE) 500 MG tablet Take 1 tablet (500 mg total) by mouth 2 (two) times daily with a meal.  60 tablet  11      Review of Systems Review of Systems  Constitutional: Negative for fever, appetite change, fatigue and unexpected weight change.  Eyes: Negative for pain and visual disturbance.  Respiratory: Negative for cough and shortness of breath.   Cardiovascular: Negative for cp or palpitations    Gastrointestinal: Negative for nausea, diarrhea and constipation.  Genitourinary: Negative for urgency and frequency. pos for dysuria  Skin: Negative for pallor or rash   Neurological: Negative for  weakness, light-headedness, numbness and headaches.  Hematological: Negative for adenopathy. Does not bruise/bleed easily.  Psychiatric/Behavioral: Negative for dysphoric mood. The patient is not nervous/anxious.         Objective:   Physical Exam  Constitutional: She is oriented to person, place, and time. She appears well-developed and well-nourished. No distress.  HENT:  Head: Normocephalic and atraumatic.  Right Ear: External ear normal.  Left Ear: External ear normal.  Nose: Nose normal.  Mouth/Throat: Oropharynx is clear and moist.  Eyes: Conjunctivae and EOM are normal. Pupils are equal, round, and reactive to light. No scleral icterus.  Neck: Normal range of motion. Neck supple. No JVD present. Carotid bruit is not present. No thyromegaly present.  Cardiovascular: Normal rate, regular rhythm, normal heart sounds and intact distal pulses.  Exam reveals no gallop.   Pulmonary/Chest: Effort normal and breath sounds normal. No respiratory distress. She has no wheezes.  Abdominal: Soft. Bowel sounds are normal. She exhibits no distension, no abdominal bruit and no mass. There is no tenderness.  Genitourinary:       Gyn/breast exam will be done by her gyn  Musculoskeletal: Normal range of motion. She exhibits no edema and no tenderness.  Lymphadenopathy:    She has no cervical adenopathy.  Neurological: She is alert and oriented to person, place, and time. She has normal reflexes. No cranial nerve deficit. She exhibits normal muscle tone. Coordination normal.  Skin: Skin is warm and dry. No rash noted. She is not diaphoretic. No erythema. No pallor.  Psychiatric: She has a normal mood and affect.          Assessment & Plan:

## 2011-06-05 NOTE — Assessment & Plan Note (Signed)
Reviewed health habits including diet and exercise and skin cancer prevention Also reviewed health mt list, fam hx and immunizations   Is late for /declines much health mt due to poor ins coverage Tdap today

## 2011-06-08 ENCOUNTER — Encounter: Payer: BC Managed Care – PPO | Admitting: Family Medicine

## 2012-01-29 LAB — HM DIABETES EYE EXAM

## 2012-01-30 ENCOUNTER — Encounter: Payer: Self-pay | Admitting: Family Medicine

## 2012-02-16 LAB — HM DEXA SCAN

## 2012-02-20 ENCOUNTER — Encounter: Payer: Self-pay | Admitting: Gastroenterology

## 2012-03-13 ENCOUNTER — Ambulatory Visit (AMBULATORY_SURGERY_CENTER): Payer: Medicare Other | Admitting: *Deleted

## 2012-03-13 ENCOUNTER — Encounter: Payer: Self-pay | Admitting: Gastroenterology

## 2012-03-13 VITALS — Ht 64.0 in | Wt 192.4 lb

## 2012-03-13 DIAGNOSIS — Z1211 Encounter for screening for malignant neoplasm of colon: Secondary | ICD-10-CM

## 2012-03-13 MED ORDER — MOVIPREP 100 G PO SOLR: ORAL | Status: DC

## 2012-03-27 ENCOUNTER — Encounter: Payer: BC Managed Care – PPO | Admitting: Gastroenterology

## 2012-03-31 ENCOUNTER — Ambulatory Visit (AMBULATORY_SURGERY_CENTER): Payer: Medicare Other | Admitting: Gastroenterology

## 2012-03-31 ENCOUNTER — Other Ambulatory Visit: Payer: Self-pay | Admitting: Gastroenterology

## 2012-03-31 ENCOUNTER — Encounter: Payer: Self-pay | Admitting: Gastroenterology

## 2012-03-31 VITALS — BP 135/66 | HR 64 | Temp 98.4°F | Resp 21 | Ht 64.0 in | Wt 192.0 lb

## 2012-03-31 DIAGNOSIS — D126 Benign neoplasm of colon, unspecified: Secondary | ICD-10-CM

## 2012-03-31 DIAGNOSIS — Z1211 Encounter for screening for malignant neoplasm of colon: Secondary | ICD-10-CM

## 2012-03-31 DIAGNOSIS — Z86010 Personal history of colonic polyps: Secondary | ICD-10-CM

## 2012-03-31 MED ORDER — SODIUM CHLORIDE 0.9 % IV SOLN: 500.0000 mL | INTRAVENOUS | Status: DC

## 2012-03-31 NOTE — Progress Notes (Signed)
No complaints noted in the recovery room. Maw  Pt assisted to the restroom before discharge. Maw  Patient did not experience any of the following events: a burn prior to discharge; a fall within the facility; wrong site/side/patient/procedure/implant event; or a hospital transfer or hospital admission upon discharge from the facility. ([id number])Patient did not have preoperative order for IV antibiotic SSI prophylaxis. [phone])

## 2012-03-31 NOTE — Patient Instructions (Signed)
YOU HAD AN ENDOSCOPIC PROCEDURE TODAY AT THE [facility]: Refer to the procedure report that was given to you for any specific questions about what was found during the examination.  If the procedure report does not answer your questions, please call your gastroenterologist to clarify.  If you requested that your care partner not be given the details of your procedure findings, then the procedure report has been included in a sealed envelope for you to review at your convenience later.  YOU SHOULD EXPECT: Some feelings of bloating in the abdomen. Passage of more gas than usual.  Walking can help get rid of the air that was put into your GI tract during the procedure and reduce the bloating. If you had a lower endoscopy (such as a colonoscopy or flexible sigmoidoscopy) you may notice spotting of blood in your stool or on the toilet paper. If you underwent a bowel prep for your procedure, then you may not have a normal bowel movement for a few days.  DIET: Your first meal following the procedure should be a light meal and then it is ok to progress to your normal diet.  A half-sandwich or bowl of soup is an example of a good first meal.  Heavy or fried foods are harder to digest and may make you feel nauseous or bloated.  Likewise meals heavy in dairy and vegetables can cause extra gas to form and this can also increase the bloating.  Drink plenty of fluids but you should avoid alcoholic beverages for 24 hours.  ACTIVITY: Your care partner should take you home directly after the procedure.  You should plan to take it easy, moving slowly for the rest of the day.  You can resume normal activity the day after the procedure however you should NOT DRIVE or use heavy machinery for 24 hours (because of the sedation medicines used during the test).    SYMPTOMS TO REPORT IMMEDIATELY: A gastroenterologist can be reached at any hour.  During normal business hours, 8:30 AM to 5:00 PM Monday through Friday,  call [phone].  After hours and on weekends, please call the GI answering service at [phone] who will take a message and have the physician on call contact you.   Following lower endoscopy (colonoscopy or flexible sigmoidoscopy):  Excessive amounts of blood in the stool  Significant tenderness or worsening of abdominal pains  Swelling of the abdomen that is new, acute  Fever of 100F or higher    FOLLOW UP: If any biopsies were taken you will be contacted by phone or by letter within the next 1-3 weeks.  Call your gastroenterologist if you have not heard about the biopsies in 3 weeks.  Our staff will call the home number listed on your records the next business day following your procedure to check on you and address any questions or concerns that you may have at that time regarding the information given to you following your procedure. This is a courtesy call and so if there is no answer at the home number and we have not heard from you through the emergency physician on call, we will assume that you have returned to your regular daily activities without incident.  SIGNATURES/CONFIDENTIALITY: You and/or your care partner have signed paperwork which will be entered into your electronic medical record.  These signatures attest to the fact that that the information above on your After Visit Summary has been reviewed and is understood.  Full responsibility of the confidentiality   of this discharge information lies with you and/or your care-partner.     

## 2012-03-31 NOTE — Op Note (Signed)
[facility] [address] [place], [address]   COLONOSCOPY PROCEDURE REPORT  PATIENT: Amy Fry, Amy Fry  MR#: [id number] BIRTHDATE: 1945-08-24 , 66  yrs. old GENDER: Female ENDOSCOPIST: Mardella Layman, MD, [facility] REFERRED BY: PROCEDURE DATE:  03/31/2012 PROCEDURE:   Colonoscopy with snare polypectomy ASA CLASS:   Class II INDICATIONS:Patient's personal history of adenomatous colon polyps.  MEDICATIONS: propofol (Diprivan) 300mg  IV  DESCRIPTION OF PROCEDURE:   After the risks and benefits and of the procedure were explained, informed consent was obtained.  A digital rectal exam revealed no abnormalities of the rectum.    The LB [id number]  endoscope was introduced through the anus and advanced to the cecum, which was identified by both the appendix and ileocecal valve .  The quality of the prep was good, using MoviPrep .  The instrument was then slowly withdrawn as the colon was fully examined.     COLON FINDINGS: A smooth and firm sessile polyp ranging between 5-52mm in size was found in the sigmoid colon.  A polypectomy was performed using snare cautery.  The resection was complete and the polyp tissue was completely retrieved.   The colon was otherwise normal.  There was no diverticulosis, inflammation, polyps or cancers unless previously stated.     Retroflexed views revealed no abnormalities.     The scope was then withdrawn from the patient and the procedure completed.  COMPLICATIONS: There were no complications. ENDOSCOPIC IMPRESSION: 1.   Sessile polyp ranging between 5-39mm in size was found in the sigmoid colon; polypectomy was performed using snare cautery ,probable adenoma, 2.   The colon was otherwise normal  RECOMMENDATIONS: 1.  Await pathology results 2.  Repeat colonoscopy in 5 years if polyp adenomatous; otherwise 10 years 3.  Continue current medications   REPEAT EXAM:  cc:  _______________________________ eSignedMardella Layman, MD, [facility] 03/31/2012 11:47 AM

## 2012-03-31 NOTE — Progress Notes (Signed)
Called to room to assist during endoscopic procedure.  Patient ID and intended procedure confirmed with present staff. Received instructions for my participation in the procedure from the performing physician.  

## 2012-04-01 ENCOUNTER — Telehealth: Payer: Self-pay | Admitting: *Deleted

## 2012-04-01 NOTE — Telephone Encounter (Signed)
  Follow up Call-  Call back number 03/31/2012  Post procedure Call Back phone  # [phone] hm  Permission to leave phone message Yes     Patient questions:  Do you have a fever, pain , or abdominal swelling? no Pain Score  0 *  Have you tolerated food without any problems? yes  Have you been able to return to your normal activities? yes  Do you have any questions about your discharge instructions: Diet   no Medications  no Follow up visit  no  Do you have questions or concerns about your Care? no  Actions: * If pain score is 4 or above: No action needed, pain <4.

## 2012-04-17 ENCOUNTER — Encounter: Payer: Self-pay | Admitting: Gastroenterology

## 2012-05-20 ENCOUNTER — Telehealth: Payer: Self-pay

## 2012-05-20 NOTE — Telephone Encounter (Signed)
Pt scheduled fasting lab for CPX on 06/03/12 and CPX on 06/17/12.

## 2012-06-01 ENCOUNTER — Telehealth: Payer: Self-pay | Admitting: Family Medicine

## 2012-06-01 DIAGNOSIS — I10 Essential (primary) hypertension: Secondary | ICD-10-CM

## 2012-06-01 DIAGNOSIS — E119 Type 2 diabetes mellitus without complications: Secondary | ICD-10-CM

## 2012-06-01 DIAGNOSIS — M899 Disorder of bone, unspecified: Secondary | ICD-10-CM

## 2012-06-01 DIAGNOSIS — E559 Vitamin D deficiency, unspecified: Secondary | ICD-10-CM

## 2012-06-01 DIAGNOSIS — E7800 Pure hypercholesterolemia, unspecified: Secondary | ICD-10-CM

## 2012-06-01 NOTE — Telephone Encounter (Signed)
Message copied by Judy Pimple on Sun Jun 01, 2012  1:53 PM ------      Message from: Alvina Chou      Created: Thu May 22, 2012  3:00 PM      Regarding: lab orders for Monday, 5.19.14       Patient is scheduled for CPX labs, please order future labs, Thanks , Terri       ------

## 2012-06-03 ENCOUNTER — Other Ambulatory Visit (INDEPENDENT_AMBULATORY_CARE_PROVIDER_SITE_OTHER): Payer: Medicare Other

## 2012-06-03 DIAGNOSIS — E7800 Pure hypercholesterolemia, unspecified: Secondary | ICD-10-CM

## 2012-06-03 DIAGNOSIS — E119 Type 2 diabetes mellitus without complications: Secondary | ICD-10-CM

## 2012-06-03 DIAGNOSIS — M899 Disorder of bone, unspecified: Secondary | ICD-10-CM

## 2012-06-03 DIAGNOSIS — E559 Vitamin D deficiency, unspecified: Secondary | ICD-10-CM

## 2012-06-03 DIAGNOSIS — I10 Essential (primary) hypertension: Secondary | ICD-10-CM

## 2012-06-03 LAB — CBC WITH DIFFERENTIAL/PLATELET
Basophils Relative: 0.7 % (ref 0.0–3.0)
Eosinophils Absolute: 0.1 10*3/uL (ref 0.0–0.7)
HCT: 38.6 % (ref 36.0–46.0)
Hemoglobin: 13.4 g/dL (ref 12.0–15.0)
Lymphocytes Relative: 44.3 % (ref 12.0–46.0)
Lymphs Abs: 2 10*3/uL (ref 0.7–4.0)
MCHC: 34.7 g/dL (ref 30.0–36.0)
Monocytes Relative: 9.9 % (ref 3.0–12.0)
Neutro Abs: 1.9 10*3/uL (ref 1.4–7.7)
RBC: 4.2 Mil/uL (ref 3.87–5.11)

## 2012-06-03 LAB — LIPID PANEL
HDL: 57.1 mg/dL (ref >39.00)
LDL Cholesterol: 83 mg/dL (ref 0–99)
Total CHOL/HDL Ratio: 3
Triglycerides: 91 mg/dL (ref 0.0–149.0)

## 2012-06-03 LAB — TSH: TSH: 0.61 u[IU]/mL (ref 0.35–5.50)

## 2012-06-09 ENCOUNTER — Emergency Department (HOSPITAL_COMMUNITY)
Admission: EM | Admit: 2012-06-09 | Discharge: 2012-06-09 | Disposition: A | Discharge status: Home / Self Care | Payer: Medicare Other | Attending: Emergency Medicine | Admitting: Emergency Medicine

## 2012-06-09 ENCOUNTER — Emergency Department (HOSPITAL_COMMUNITY): Payer: Medicare Other

## 2012-06-09 ENCOUNTER — Encounter (HOSPITAL_COMMUNITY): Payer: Self-pay | Admitting: Emergency Medicine

## 2012-06-09 DIAGNOSIS — E119 Type 2 diabetes mellitus without complications: Secondary | ICD-10-CM | POA: Insufficient documentation

## 2012-06-09 DIAGNOSIS — R0789 Other chest pain: Secondary | ICD-10-CM | POA: Insufficient documentation

## 2012-06-09 DIAGNOSIS — Z79899 Other long term (current) drug therapy: Secondary | ICD-10-CM | POA: Insufficient documentation

## 2012-06-09 DIAGNOSIS — I10 Essential (primary) hypertension: Secondary | ICD-10-CM | POA: Insufficient documentation

## 2012-06-09 DIAGNOSIS — M899 Disorder of bone, unspecified: Secondary | ICD-10-CM | POA: Insufficient documentation

## 2012-06-09 DIAGNOSIS — M949 Disorder of cartilage, unspecified: Secondary | ICD-10-CM | POA: Insufficient documentation

## 2012-06-09 DIAGNOSIS — Z7982 Long term (current) use of aspirin: Secondary | ICD-10-CM | POA: Insufficient documentation

## 2012-06-09 DIAGNOSIS — H269 Unspecified cataract: Secondary | ICD-10-CM | POA: Insufficient documentation

## 2012-06-09 DIAGNOSIS — E876 Hypokalemia: Secondary | ICD-10-CM | POA: Insufficient documentation

## 2012-06-09 DIAGNOSIS — E785 Hyperlipidemia, unspecified: Secondary | ICD-10-CM | POA: Insufficient documentation

## 2012-06-09 LAB — COMPREHENSIVE METABOLIC PANEL
AST: 21 U/L (ref 0–37)
Albumin: 3.9 g/dL (ref 3.5–5.2)
Alkaline Phosphatase: 86 U/L (ref 39–117)
BUN: 25 mg/dL — ABNORMAL HIGH (ref 6–23)
Chloride: 101 mEq/L (ref 96–112)
Potassium: 3.3 mEq/L — ABNORMAL LOW (ref 3.5–5.1)
Total Bilirubin: 0.3 mg/dL (ref 0.3–1.2)
Total Protein: 7.1 g/dL (ref 6.0–8.3)

## 2012-06-09 LAB — CBC WITH DIFFERENTIAL/PLATELET
Basophils Absolute: 0 10*3/uL (ref 0.0–0.1)
Eosinophils Absolute: 0.1 10*3/uL (ref 0.0–0.7)
Eosinophils Relative: 2 % (ref 0–5)
MCH: 31.3 pg (ref 26.0–34.0)
MCV: 89.7 fL (ref 78.0–100.0)
Neutrophils Relative %: 59 % (ref 43–77)
Platelets: 279 10*3/uL (ref 150–400)
RDW: 12.9 % (ref 11.5–15.5)
WBC: 7.8 10*3/uL (ref 4.0–10.5)

## 2012-06-09 LAB — POCT I-STAT TROPONIN I
Troponin i, poc: 0 ng/mL (ref 0.00–0.08)
Troponin i, poc: 0 ng/mL (ref 0.00–0.08)

## 2012-06-09 LAB — D-DIMER, QUANTITATIVE: D-Dimer, Quant: <0.27 ug/mL-FEU (ref 0.00–0.48)

## 2012-06-09 MED ORDER — NITROGLYCERIN 2 % TD OINT
1.0000 [in_us] | TOPICAL_OINTMENT | Freq: Once | TRANSDERMAL | Status: AC
  Administered 2012-06-09: 1 [in_us] via TOPICAL
  Filled 2012-06-09: qty 1

## 2012-06-09 MED ORDER — POTASSIUM CHLORIDE CRYS ER 20 MEQ PO TBCR: 20.0000 meq | EXTENDED_RELEASE_TABLET | Freq: Two times a day (BID) | ORAL | Status: DC

## 2012-06-09 MED ORDER — METHOCARBAMOL 500 MG PO TABS: ORAL_TABLET | ORAL | Status: DC

## 2012-06-09 NOTE — ED Notes (Signed)
Old and New EKG given to Dr Knapp 

## 2012-06-09 NOTE — ED Provider Notes (Signed)
History     CSN: [id number]  Arrival date & time 06/09/12  0043   First MD Initiated Contact with Patient 06/09/12 [id number]      Chief Complaint  Patient presents with  . Chest Pain    (Consider location/radiation/quality/duration/timing/severity/associated sxs/prior treatment) HPI  Patient reports she's been having substernal chest pain since this morning that she states is coming and going. It only lasted a few seconds. She describes the pain as sharp. It only lasted a few seconds. She denies nausea, vomiting, diaphoresis, shortness of breath, or radiation of the pain. She states that nothing she does makes it feel better, nothing she does makes it feel worse. She denies any change in her activity. She states she's never had this before.  Patient states her mother died at age 61 from an MI, she states she had a brother who died of MI at age 37, she had another brother die of a stroke at age 18.  PCP Dr Milinda Antis  Past Medical History  Diagnosis Date  . Diabetes mellitus     type II  . Hypertension   . Osteopenia   . Early cataracts, bilateral     no sx yet   . Hyperlipidemia     Past Surgical History  Procedure Laterality Date  . Laparoscopic cholecystectomy    . Oophorectomy    . Cystoscopy  10-1999  . Fracture surgery  2010    rib fx- from fall     Family History  Problem Relation Age of Onset  . Heart attack Mother   . Depression Sister   . Heart attack Brother   . Stroke Brother   . Colon cancer Neg Hx   . Stomach cancer Neg Hx   . Esophageal cancer Neg Hx   . Rectal cancer Neg Hx     History  Substance Use Topics  . Smoking status: Never Smoker   . Smokeless tobacco: Never Used  . Alcohol Use: No  employed part time Retired Facilities manager as teenager  OB History   Grav Para Term Preterm Abortions TAB SAB Ect Mult Living                  Review of Systems  All other systems reviewed and are negative.    Allergies  Alendronate sodium; Atorvastatin; and  Pravastatin sodium  Home Medications   Current Outpatient Rx  Name  Route  Sig  Dispense  Refill  . Ascorbic Acid (VITAMIN C) 1000 MG tablet   Oral   Take 500 mg by mouth daily.          Marland Kitchen aspirin 81 MG EC tablet   Oral   Take 81 mg by mouth daily.           . Biotin 5000 MCG TABS   Oral   Take 1 tablet by mouth daily.         . Calcium Carbonate-Vit D-Min (CALCIUM 1200 PO)   Oral   Take 1 tablet by mouth 2 (two) times daily.         . Cholecalciferol (VITAMIN D) 2000 UNITS CAPS   Oral   Take 1 capsule by mouth daily.         Marland Kitchen CRANBERRY PO   Oral   Take 4,200 Units by mouth daily.         Marland Kitchen gemfibrozil (LOPID) 600 MG tablet   Oral   Take 1 tablet (600 mg total) by mouth 2 (two) times daily.  60 tablet   11   . hydrochlorothiazide (HYDRODIURIL) 25 MG tablet   Oral   Take 1 tablet (25 mg total) by mouth daily.   30 tablet   11   . lisinopril (PRINIVIL,ZESTRIL) 5 MG tablet   Oral   Take 1 tablet (5 mg total) by mouth daily.   30 tablet   11   . lovastatin (MEVACOR) 20 MG tablet   Oral   Take 1 tablet (20 mg total) by mouth daily.   30 tablet   11   . metFORMIN (GLUCOPHAGE) 500 MG tablet   Oral   Take 500 mg by mouth 2 (two) times daily with a meal.         . Omega-3 300 MG CAPS   Oral   Take 1 capsule by mouth 2 (two) times daily.         . vitamin B-12 (CYANOCOBALAMIN) 1000 MCG tablet   Oral   Take 1,000 mcg by mouth daily.         Marland Kitchen EXPIRED: metFORMIN (GLUCOPHAGE) 500 MG tablet   Oral   Take 1 tablet (500 mg total) by mouth 2 (two) times daily with a meal.   60 tablet   11     BP 112/63  Pulse 67  Temp(Src) 98.1 F (36.7 C) (Oral)  Resp 17  SpO2 98%  Vital signs normal    Physical Exam  Nursing note and vitals reviewed. Constitutional: She is oriented to person, place, and time. She appears well-developed and well-nourished.  Non-toxic appearance. She does not appear ill. No distress.  HENT:  Head:  Normocephalic and atraumatic.  Right Ear: External ear normal.  Left Ear: External ear normal.  Nose: Nose normal. No mucosal edema or rhinorrhea.  Mouth/Throat: Oropharynx is clear and moist and mucous membranes are normal. No dental abscesses or edematous.  Eyes: Conjunctivae and EOM are normal. Pupils are equal, round, and reactive to light.  Neck: Normal range of motion and full passive range of motion without pain. Neck supple.  Cardiovascular: Normal rate, regular rhythm and normal heart sounds.  Exam reveals no gallop and no friction rub.   No murmur heard. Pulmonary/Chest: Effort normal and breath sounds normal. No respiratory distress. She has no wheezes. She has no rhonchi. She has no rales. She exhibits no tenderness and no crepitus.    Area of pain noted  Abdominal: Soft. Normal appearance and bowel sounds are normal. She exhibits no distension. There is no tenderness. There is no rebound and no guarding.  Musculoskeletal: Normal range of motion. She exhibits no edema and no tenderness.  Moves all extremities well.   Neurological: She is alert and oriented to person, place, and time. She has normal strength. No cranial nerve deficit.  Skin: Skin is warm, dry and intact. No rash noted. No erythema. No pallor.  Psychiatric: She has a normal mood and affect. Her speech is normal and behavior is normal. Her mood appears not anxious.    ED Course  Procedures (including critical care time)  Medications  nitroGLYCERIN (NITROGLYN) 2 % ointment 1 inch (1 inch Topical Given 06/09/12 0230)   04:00 patient reports her pain is improving she is having rare episodes of discomfort.  At time of discharge we discussed getting another stress test because her family history of heart disease. Her chest pain today is very atypical for cardiac disease. She also reports she's had a stress test in the past but it was many years ago.  Results for orders placed during the hospital encounter of  06/09/12  CBC WITH DIFFERENTIAL      Result Value Range   WBC 7.8  4.0 - 10.5 K/uL   RBC 4.09  3.87 - 5.11 MIL/uL   Hemoglobin 12.8  12.0 - 15.0 g/dL   HCT 40.9  81.1 - 91.4 %   MCV 89.7  78.0 - 100.0 fL   MCH 31.3  26.0 - 34.0 pg   MCHC 34.9  30.0 - 36.0 g/dL   RDW 78.2  95.6 - 21.3 %   Platelets 279  150 - 400 K/uL   Neutrophils Relative % 59  43 - 77 %   Neutro Abs 4.6  1.7 - 7.7 K/uL   Lymphocytes Relative 27  12 - 46 %   Lymphs Abs 2.1  0.7 - 4.0 K/uL   Monocytes Relative 12  3 - 12 %   Monocytes Absolute 0.9  0.1 - 1.0 K/uL   Eosinophils Relative 2  0 - 5 %   Eosinophils Absolute 0.1  0.0 - 0.7 K/uL   Basophils Relative 1  0 - 1 %   Basophils Absolute 0.0  0.0 - 0.1 K/uL  COMPREHENSIVE METABOLIC PANEL      Result Value Range   Sodium 139  135 - 145 mEq/L   Potassium 3.3 (*) 3.5 - 5.1 mEq/L   Chloride 101  96 - 112 mEq/L   CO2 26  19 - 32 mEq/L   Glucose, Bld 105 (*) 70 - 99 mg/dL   BUN 25 (*) 6 - 23 mg/dL   Creatinine, Ser 0.86  0.50 - 1.10 mg/dL   Calcium 9.6  8.4 - 57.8 mg/dL   Total Protein 7.1  6.0 - 8.3 g/dL   Albumin 3.9  3.5 - 5.2 g/dL   AST 21  0 - 37 U/L   ALT 15  0 - 35 U/L   Alkaline Phosphatase 86  39 - 117 U/L   Total Bilirubin 0.3  0.3 - 1.2 mg/dL   GFR calc non Af Amer 76 (*) >90 mL/min   GFR calc Af Amer 88 (*) >90 mL/min  D-DIMER, QUANTITATIVE      Result Value Range   D-Dimer, Quant <0.27  0.00 - 0.48 ug/mL-FEU  POCT I-STAT TROPONIN I      Result Value Range   Troponin i, poc 0.00  0.00 - 0.08 ng/mL   Comment 3           POCT I-STAT TROPONIN I      Result Value Range   Troponin i, poc 0.00  0.00 - 0.08 ng/mL   Comment 3            Laboratory interpretation all normal   Dg Chest Portable 1 View  06/09/2012   *RADIOLOGY REPORT*  Clinical Data: Chest pain.  PORTABLE CHEST - 1 VIEW  Comparison: Chest radiograph performed 05/14/2011  Findings: The lungs are hypoexpanded; mild bibasilar atelectasis is noted.  No pleural effusion or pneumothorax  is seen.  The cardiomediastinal silhouette is borderline normal in size.  No acute osseous abnormalities are identified.  IMPRESSION: Lungs hypoexpanded, with mild bibasilar atelectasis.   Original Report Authenticated By: Tonia Ghent, M.D.     Date: 06/09/2012  Rate: 64  Rhythm: normal sinus rhythm  QRS Axis: normal  Intervals: normal  ST/T Wave abnormalities: normal  Conduction Disutrbances:none  Narrative Interpretation:   Old EKG Reviewed: changes noted from 05/14/2011 NSTWC no longer present  1. Atypical chest pain   2. Hypokalemia    New Prescriptions   METHOCARBAMOL (ROBAXIN) 500 MG TABLET    Take 1 or 2 po Q 6hrs for pain   POTASSIUM CHLORIDE SA (K-DUR,KLOR-CON) 20 MEQ TABLET    Take 1 tablet (20 mEq total) by mouth 2 (two) times daily.    Plan discharge     MDM          Ward Givens, MD 06/09/12 [phone]

## 2012-06-09 NOTE — ED Notes (Signed)
Arrived by EMS patient reports chest pain that has been coming off and on since this am, currently she states she has a little discomfort that is coming and going

## 2012-06-09 NOTE — ED Notes (Signed)
Patient reports that she had some pain and tightness in her left neck

## 2012-06-09 NOTE — ED Notes (Signed)
Pt placed in a gown and hooked up to the cardiac monitor. Vitals taken.

## 2012-06-17 ENCOUNTER — Ambulatory Visit (INDEPENDENT_AMBULATORY_CARE_PROVIDER_SITE_OTHER): Payer: Medicare Other | Admitting: Family Medicine

## 2012-06-17 ENCOUNTER — Encounter: Payer: Self-pay | Admitting: Family Medicine

## 2012-06-17 VITALS — BP 106/78 | HR 60 | Temp 98.1°F | Ht 63.75 in | Wt 186.8 lb

## 2012-06-17 DIAGNOSIS — M949 Disorder of cartilage, unspecified: Secondary | ICD-10-CM

## 2012-06-17 DIAGNOSIS — M899 Disorder of bone, unspecified: Secondary | ICD-10-CM

## 2012-06-17 DIAGNOSIS — I10 Essential (primary) hypertension: Secondary | ICD-10-CM

## 2012-06-17 DIAGNOSIS — E559 Vitamin D deficiency, unspecified: Secondary | ICD-10-CM

## 2012-06-17 DIAGNOSIS — E876 Hypokalemia: Secondary | ICD-10-CM | POA: Insufficient documentation

## 2012-06-17 DIAGNOSIS — E7800 Pure hypercholesterolemia, unspecified: Secondary | ICD-10-CM

## 2012-06-17 DIAGNOSIS — Z0000 Encounter for general adult medical examination without abnormal findings: Secondary | ICD-10-CM | POA: Insufficient documentation

## 2012-06-17 DIAGNOSIS — E119 Type 2 diabetes mellitus without complications: Secondary | ICD-10-CM

## 2012-06-17 MED ORDER — LISINOPRIL 5 MG PO TABS: 5.0000 mg | ORAL_TABLET | Freq: Every day | ORAL | Status: DC

## 2012-06-17 MED ORDER — LOVASTATIN 20 MG PO TABS: 20.0000 mg | ORAL_TABLET | Freq: Every day | ORAL | Status: DC

## 2012-06-17 MED ORDER — HYDROCHLOROTHIAZIDE 25 MG PO TABS: 25.0000 mg | ORAL_TABLET | Freq: Every day | ORAL | Status: DC

## 2012-06-17 MED ORDER — GEMFIBROZIL 600 MG PO TABS: 600.0000 mg | ORAL_TABLET | Freq: Two times a day (BID) | ORAL | Status: DC

## 2012-06-17 MED ORDER — METFORMIN HCL 500 MG PO TABS: 500.0000 mg | ORAL_TABLET | Freq: Two times a day (BID) | ORAL | Status: DC

## 2012-06-17 NOTE — Assessment & Plan Note (Signed)
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

## 2012-06-17 NOTE — Assessment & Plan Note (Signed)
Stable A1C of 6.5  On metformin and diet Counseled on diet/ exercise and wt loss  opthy utd Nl foot exam On ace Declines imms

## 2012-06-17 NOTE — Patient Instructions (Addendum)
Consider working on a living will/ power of attorney Try to get 30 minutes of extra exercise daily  Checking potassium today and will update you with that

## 2012-06-17 NOTE — Assessment & Plan Note (Signed)
Imp with current dosing Disc imp to bone and overall health

## 2012-06-17 NOTE — Assessment & Plan Note (Signed)
dexa 2/14 at gyn-per pt imp D level is also tx No fx

## 2012-06-17 NOTE — Assessment & Plan Note (Signed)
bp in fair control at this time  No changes needed  Disc lifstyle change with low sodium diet and exercise  Lab reviewed  

## 2012-06-17 NOTE — Assessment & Plan Note (Signed)
Lipids stable on statin and lopid-no problems Disc goals for lipids and reasons to control them Rev labs with pt Rev low sat fat diet in detail

## 2012-06-17 NOTE — Progress Notes (Signed)
Subjective:    Patient ID: Amy Fry, female    DOB: April 21, 1945, 67 y.o.   MRN: [id number]  HPI I have personally reviewed the Medicare Annual Wellness questionnaire and have noted 1. The patient's medical and social history 2. Their use of alcohol, tobacco or illicit drugs 3. Their current medications and supplements 4. The patient's functional ability including ADL's, fall risks, home safety risks and hearing or visual             impairment. 5. Diet and physical activities 6. Evidence for depression or mood disorders  Went to the ER recently with chest pain - was sharp She r/o for MI  Given a muscle relaxer-no help It finally went away by itself  ? What it was  ? Stress rel No GI problems   The patients weight, height, BMI have been recorded in the chart and visual acuity is per eye clinic.  I have made referrals, counseling and provided education to the patient based review of the above and I have provided the pt with a written personalized care plan for preventive services.  See scanned forms.  Routine anticipatory guidance given to patient.  See health maintenance. Flu - declines  Shingles- declines  PNA- declines  Tetanus 5/13 up to date on that vaccine  Colon 3/14 - polyp- with 5 year recall  Breast cancer screening 2/14 mammo with gyn - was normal Nl self breast exam  Pap also 2/14 with gyn  Advance directive- does not have one - will work on that  Cognitive function addressed- see scanned forms- and if abnormal then additional documentation follows.  No concerns about that   PMH and SH reviewed  Meds, vitals, and allergies reviewed.   ROS: See HPI.  Otherwise negative.    Falls-none   Mood- very good/ not depressed   Wt is down 6 lb with bmi of 32 Diet- has been eating a bit less (and just came back from the beach)  Exercise- is active- moving all the time and does a lot of yard work   K was 3.3 last check Lab Results  Component Value Date   K  3.3* 06/09/2012   given pills in the ER for that  This level needs to be re checked   Osteopenia dexa 2/14 at gyn- improved a little bit  D level 59  DM a1c is 6.5 -stable opthy up to date  On ace  No symptoms Good understanding of diet to follow  Cholesterol Lab Results  Component Value Date   CHOL 158 06/03/2012   CHOL 147 05/29/2011   CHOL 161 08/17/2010   Lab Results  Component Value Date   HDL 57.10 06/03/2012   HDL 40.98 05/29/2011   HDL 11.91 08/17/2010   Lab Results  Component Value Date   LDLCALC 83 06/03/2012   LDLCALC 65 05/29/2011   LDLCALC 79 08/17/2010   Lab Results  Component Value Date   TRIG 91.0 06/03/2012   TRIG 116.0 05/29/2011   TRIG 116.0 08/17/2010   Lab Results  Component Value Date   CHOLHDL 3 06/03/2012   CHOLHDL 2 05/29/2011   CHOLHDL 3 08/17/2010   No results found for this basename: LDLDIRECT   looking good with statin and diet   Patient Active Problem List   Diagnosis Date Noted  . Encounter for Medicare annual wellness exam 06/17/2012  . Hypokalemia 06/17/2012  . Dysuria 06/05/2011  . Routine general medical examination at a health care facility 02/18/2011  .  UNSPECIFIED VITAMIN D DEFICIENCY 07/30/2008  . DERMATOPHYTOSIS, NAIL 07/26/2006  . DIABETES MELLITUS, TYPE II 07/25/2006  . HYPERCHOLESTEROLEMIA 07/25/2006  . HYPERTENSION 07/25/2006  . OSTEOPENIA 07/25/2006   Past Medical History  Diagnosis Date  . Diabetes mellitus     type II  . Hypertension   . Osteopenia   . Early cataracts, bilateral     no sx yet   . Hyperlipidemia    Past Surgical History  Procedure Laterality Date  . Laparoscopic cholecystectomy    . Oophorectomy    . Cystoscopy  10-1999  . Fracture surgery  2010    rib fx- from fall    History  Substance Use Topics  . Smoking status: Never Smoker   . Smokeless tobacco: Never Used  . Alcohol Use: No   Family History  Problem Relation Age of Onset  . Heart attack Mother   . Depression Sister   . Heart  attack Brother   . Stroke Brother   . Colon cancer Neg Hx   . Stomach cancer Neg Hx   . Esophageal cancer Neg Hx   . Rectal cancer Neg Hx    Allergies  Allergen Reactions  . Alendronate Sodium     REACTION: reaction not known  . Atorvastatin     REACTION: headaches  . Pravastatin Sodium     REACTION: headaches and arm pain   Current Outpatient Prescriptions on File Prior to Visit  Medication Sig Dispense Refill  . Ascorbic Acid (VITAMIN C) 1000 MG tablet Take 500 mg by mouth daily.       Marland Kitchen aspirin 81 MG EC tablet Take 81 mg by mouth daily.        . Biotin 5000 MCG TABS Take 1 tablet by mouth daily.      . Calcium Carbonate-Vit D-Min (CALCIUM 1200 PO) Take 1 tablet by mouth 2 (two) times daily.      . Cholecalciferol (VITAMIN D) 2000 UNITS CAPS Take 1 capsule by mouth daily.      Marland Kitchen CRANBERRY PO Take 4,200 Units by mouth daily.      . Omega-3 300 MG CAPS Take 1 capsule by mouth 2 (two) times daily.      . vitamin B-12 (CYANOCOBALAMIN) 1000 MCG tablet Take 1,000 mcg by mouth daily.       No current facility-administered medications on file prior to visit.    Review of Systems     Objective:   Physical Exam  Constitutional: She appears well-developed and well-nourished. No distress.  obese and well appearing   HENT:  Head: Normocephalic and atraumatic.  Right Ear: External ear normal.  Left Ear: External ear normal.  Nose: Nose normal.  Mouth/Throat: Oropharynx is clear and moist.  Eyes: Conjunctivae and EOM are normal. Pupils are equal, round, and reactive to light. Right eye exhibits no discharge. Left eye exhibits no discharge. No scleral icterus.  Neck: Normal range of motion. Neck supple. No JVD present. Carotid bruit is not present. No thyromegaly present.  Cardiovascular: Normal rate, regular rhythm, normal heart sounds and intact distal pulses.  Exam reveals no gallop.   Pulmonary/Chest: Effort normal and breath sounds normal. No respiratory distress. She has no  wheezes. She exhibits no tenderness.  Abdominal: Soft. Bowel sounds are normal. She exhibits no distension, no abdominal bruit and no mass. There is no tenderness.  Musculoskeletal: She exhibits no edema and no tenderness.  Lymphadenopathy:    She has no cervical adenopathy.  Neurological: She is alert. She  has normal reflexes. No cranial nerve deficit. She exhibits normal muscle tone. Coordination normal.  Skin: Skin is warm and dry. No rash noted. No erythema. No pallor.  Psychiatric: She has a normal mood and affect.          Assessment & Plan:

## 2012-06-17 NOTE — Assessment & Plan Note (Signed)
From ER tx with oral supl-done with that  Lab today for level

## 2012-06-18 ENCOUNTER — Encounter: Payer: Self-pay | Admitting: *Deleted

## 2012-07-24 ENCOUNTER — Other Ambulatory Visit: Payer: Self-pay

## 2012-08-19 ENCOUNTER — Ambulatory Visit (INDEPENDENT_AMBULATORY_CARE_PROVIDER_SITE_OTHER): Payer: Medicare Other | Admitting: Family Medicine

## 2012-08-19 ENCOUNTER — Encounter: Payer: Self-pay | Admitting: Family Medicine

## 2012-08-19 VITALS — BP 118/82 | HR 70 | Temp 98.7°F | Ht 63.75 in | Wt 186.5 lb

## 2012-08-19 DIAGNOSIS — K118 Other diseases of salivary glands: Secondary | ICD-10-CM

## 2012-08-19 NOTE — Progress Notes (Signed)
Subjective:    Patient ID: Amy Fry, female    DOB: [birth year], 67 y.o.   MRN: [id number]  HPI Here with swelling of R side of her face  Noticed it last night - no pain, no redness or rash Went to bed and got up and it looked normal Now it looks swollen again  A little sore to the touch under jaw angle  No tooth pain  No jaw clicking  No trouble with dry mouth lately Ear -no pain , just feels a bit funny No hearing problem No headache or neck pain   Is due for dental visit  Patient Active Problem List   Diagnosis Date Noted  . Encounter for Medicare annual wellness exam 06/17/2012  . Hypokalemia 06/17/2012  . Routine general medical examination at a health care facility 02/18/2011  . UNSPECIFIED VITAMIN D DEFICIENCY 07/30/2008  . DERMATOPHYTOSIS, NAIL 07/26/2006  . DIABETES MELLITUS, TYPE II 07/25/2006  . HYPERCHOLESTEROLEMIA 07/25/2006  . HYPERTENSION 07/25/2006  . OSTEOPENIA 07/25/2006   Past Medical History  Diagnosis Date  . Diabetes mellitus     type II  . Hypertension   . Osteopenia   . Early cataracts, bilateral     no sx yet   . Hyperlipidemia    Past Surgical History  Procedure Laterality Date  . Laparoscopic cholecystectomy    . Oophorectomy    . Cystoscopy  10-1999  . Fracture surgery  2010    rib fx- from fall    History  Substance Use Topics  . Smoking status: Never Smoker   . Smokeless tobacco: Never Used  . Alcohol Use: No   Family History  Problem Relation Age of Onset  . Heart attack Mother   . Depression Sister   . Heart attack Brother   . Stroke Brother   . Colon cancer Neg Hx   . Stomach cancer Neg Hx   . Esophageal cancer Neg Hx   . Rectal cancer Neg Hx    Allergies  Allergen Reactions  . Alendronate Sodium     REACTION: reaction not known  . Atorvastatin     REACTION: headaches  . Pravastatin Sodium     REACTION: headaches and arm pain   Current Outpatient Prescriptions on File Prior to Visit  Medication Sig  Dispense Refill  . Ascorbic Acid (VITAMIN C) 1000 MG tablet Take 500 mg by mouth daily.       Marland Kitchen aspirin 81 MG EC tablet Take 81 mg by mouth daily.        . Biotin 5000 MCG TABS Take 1 tablet by mouth daily.      . Calcium Carbonate-Vit D-Min (CALCIUM 1200 PO) Take 1 tablet by mouth 2 (two) times daily.      . Cholecalciferol (VITAMIN D) 2000 UNITS CAPS Take 1 capsule by mouth daily.      Marland Kitchen CRANBERRY PO Take 4,200 Units by mouth daily.      Marland Kitchen gemfibrozil (LOPID) 600 MG tablet Take 1 tablet (600 mg total) by mouth 2 (two) times daily.  60 tablet  11  . hydrochlorothiazide (HYDRODIURIL) 25 MG tablet Take 1 tablet (25 mg total) by mouth daily.  30 tablet  11  . lisinopril (PRINIVIL,ZESTRIL) 5 MG tablet Take 1 tablet (5 mg total) by mouth daily.  30 tablet  11  . lovastatin (MEVACOR) 20 MG tablet Take 1 tablet (20 mg total) by mouth daily.  30 tablet  11  . metFORMIN (GLUCOPHAGE) 500 MG tablet Take  1 tablet (500 mg total) by mouth 2 (two) times daily with a meal.  60 tablet  11  . Omega-3 300 MG CAPS Take 1 capsule by mouth 2 (two) times daily.      . vitamin B-12 (CYANOCOBALAMIN) 1000 MCG tablet Take 1,000 mcg by mouth daily.       No current facility-administered medications on file prior to visit.      Review of Systems Review of Systems  Constitutional: Negative for fever, appetite change, fatigue and unexpected weight change.  Eyes: Negative for pain and visual disturbance.  ENT neg for tooth or jaw pain / neg for sinus pain or congestion or st  Respiratory: Negative for cough and shortness of breath.   Cardiovascular: Negative for cp or palpitations    Gastrointestinal: Negative for nausea, diarrhea and constipation.  Genitourinary: Negative for urgency and frequency.  Skin: Negative for pallor or rash   Neurological: Negative for weakness, light-headedness, numbness and headaches.  Hematological: Negative for adenopathy. Does not bruise/bleed easily.  Psychiatric/Behavioral: Negative  for dysphoric mood. The patient is not nervous/anxious.         Objective:   Physical Exam  Constitutional: She appears well-developed and well-nourished. No distress.  obese and well appearing   HENT:  Head: Normocephalic and atraumatic.  Right Ear: External ear normal.  Left Ear: External ear normal.  Nose: Nose normal.  Mouth/Throat: Oropharynx is clear and moist.  Mild fullness over R parotid area and under angle of jaw without definite adenopathy Little to no tenderness No redness or warmth No TMJ pain or clicking  Good dentition  Eyes: Conjunctivae and EOM are normal. Pupils are equal, round, and reactive to light. Right eye exhibits no discharge. Left eye exhibits no discharge. No scleral icterus.  Neck: Normal range of motion. Neck supple. No JVD present. Carotid bruit is not present. No thyromegaly present.  Cardiovascular: Normal rate and regular rhythm.   Pulmonary/Chest: Effort normal and breath sounds normal. She has no wheezes.  Lymphadenopathy:    She has no cervical adenopathy.  Neurological: She is alert. She has normal reflexes. No cranial nerve deficit.  Skin: Skin is warm and dry. No rash noted. No erythema. No pallor.  Psychiatric: She has a normal mood and affect.          Assessment & Plan:

## 2012-08-19 NOTE — Patient Instructions (Addendum)
I think you may have some swelling/ inflammation of parotid gland (parotiditis) This can come from a blocked salivary duct  For this - suck on sour sugar free candies or a lemon or lime Use warm compress on that side of your face If not improved in a week or if worse let me know

## 2012-08-21 NOTE — Assessment & Plan Note (Signed)
Suspect parotiditis  Disc use of sugar free sour hard candies to enc salivation and help condition Adv to update in a week if no imp or if pain or new symptoms

## 2013-05-15 LAB — HM MAMMOGRAPHY

## 2013-05-27 ENCOUNTER — Emergency Department: Payer: Self-pay | Admitting: Internal Medicine

## 2013-05-28 ENCOUNTER — Other Ambulatory Visit: Payer: Self-pay | Admitting: Orthopedic Surgery

## 2013-05-28 ENCOUNTER — Ambulatory Visit
Admission: RE | Admit: 2013-05-28 | Discharge: 2013-05-28 | Disposition: A | Discharge status: Home / Self Care | Payer: Commercial Managed Care - HMO | Source: Ambulatory Visit | Attending: Orthopedic Surgery | Admitting: Orthopedic Surgery

## 2013-05-28 DIAGNOSIS — T148XXA Other injury of unspecified body region, initial encounter: Secondary | ICD-10-CM

## 2013-06-01 ENCOUNTER — Encounter (HOSPITAL_COMMUNITY): Payer: Self-pay | Admitting: Pharmacy Technician

## 2013-06-04 ENCOUNTER — Encounter (HOSPITAL_COMMUNITY): Payer: Self-pay | Admitting: *Deleted

## 2013-06-08 NOTE — H&P (Signed)
[facility] H&P  Chief Complaint: L proximal humerus fx  HPI: 68 y/o female sustained a L proximal humerus fracture. She was referred to [facility] for evaluation and treatment.  Options reviewed with pt. She presents for ORIF of L proximal humerus.   Past Medical History  Diagnosis Date  . Diabetes mellitus     type II  . Hypertension   . Osteopenia   . Early cataracts, bilateral     no sx yet   . Hyperlipidemia   . Arthritis     Past Surgical History  Procedure Laterality Date  . Laparoscopic cholecystectomy    . Oophorectomy    . Cystoscopy  10-1999  . Fracture surgery  2010    rib fx- from fall     Family History  Problem Relation Age of Onset  . Heart attack Mother   . Depression Sister   . Heart attack Brother   . Stroke Brother   . Colon cancer Neg Hx   . Stomach cancer Neg Hx   . Esophageal cancer Neg Hx   . Rectal cancer Neg Hx    Social History:  reports that she has never smoked. She has never used smokeless tobacco. She reports that she does not drink alcohol or use illicit drugs.  Allergies:  Allergies  Allergen Reactions  . Alendronate Sodium     REACTION: reaction not known  . Atorvastatin     REACTION: headaches  . Pravastatin Sodium     REACTION: headaches and arm pain    No current facility-administered medications on file prior to encounter.   Current Outpatient Prescriptions on File Prior to Encounter  Medication Sig Dispense Refill  . Ascorbic Acid (VITAMIN C) 1000 MG tablet Take 500 mg by mouth daily.       Marland Kitchen aspirin 81 MG EC tablet Take 81 mg by mouth daily.        . Biotin 5000 MCG TABS Take 5,000 mcg by mouth daily.       . Calcium Carbonate-Vit D-Min (CALCIUM 1200 PO) Take 1 tablet by mouth 2 (two) times daily.      . Cholecalciferol (VITAMIN D) 2000 UNITS CAPS Take 2,000 Units by mouth daily.       Marland Kitchen CRANBERRY PO Take 4,200 Units by mouth daily.      Marland Kitchen gemfibrozil (LOPID) 600 MG tablet Take 1 tablet (600 mg total) by  mouth 2 (two) times daily.  60 tablet  11  . hydrochlorothiazide (HYDRODIURIL) 25 MG tablet Take 1 tablet (25 mg total) by mouth daily.  30 tablet  11  . lisinopril (PRINIVIL,ZESTRIL) 5 MG tablet Take 1 tablet (5 mg total) by mouth daily.  30 tablet  11  . lovastatin (MEVACOR) 20 MG tablet Take 1 tablet (20 mg total) by mouth daily.  30 tablet  11  . metFORMIN (GLUCOPHAGE) 500 MG tablet Take 1 tablet (500 mg total) by mouth 2 (two) times daily with a meal.  60 tablet  11  . Omega-3 300 MG CAPS Take 300 mg by mouth 2 (two) times daily.       . vitamin B-12 (CYANOCOBALAMIN) 1000 MCG tablet Take 1,000 mcg by mouth daily.        Review of Systems  Constitutional: Negative for fever and chills.  Respiratory: Negative for shortness of breath and wheezing.   Cardiovascular: Negative for chest pain and palpitations.  Gastrointestinal: Negative for nausea, vomiting and abdominal pain.  Musculoskeletal:  L shoulder pain   Neurological: Negative for tingling, sensory change and headaches.    There were no vitals taken for this visit. Physical Exam  Constitutional: She is cooperative.  HENT:  Head: Normocephalic and atraumatic.  Cardiovascular: S1 normal and S2 normal.   Respiratory: Effort normal and breath sounds normal. She has no wheezes. She has no rhonchi.  GI: Soft. There is no tenderness.  Musculoskeletal:  Left Upper Extremity     + Swelling    Motor and sensory functions intact     Ext warm     + radial pulse    Blistering forearm improved     Skin stable   Neurological: She is alert.     Assessment/Plan  68 y/o female with L proximal humerus fracture  OR for ORIF L proximal humerus fx Admit after surgery overnight for observation and pain control  Risks and benefits reviewed with pt and family, wish to proceed   [name] PA-C  06/08/2013, 11:11 PM

## 2013-06-09 ENCOUNTER — Ambulatory Visit (HOSPITAL_COMMUNITY): Payer: Medicare HMO

## 2013-06-09 ENCOUNTER — Encounter (HOSPITAL_COMMUNITY): Payer: Self-pay | Admitting: *Deleted

## 2013-06-09 ENCOUNTER — Ambulatory Visit (HOSPITAL_COMMUNITY)
Admission: RE | Admit: 2013-06-09 | Discharge: 2013-06-11 | Disposition: A | Discharge status: Home / Self Care | Payer: Medicare HMO | Source: Ambulatory Visit | Attending: Orthopedic Surgery | Admitting: Orthopedic Surgery

## 2013-06-09 ENCOUNTER — Encounter (HOSPITAL_COMMUNITY): Payer: Medicare HMO | Admitting: Certified Registered Nurse Anesthetist

## 2013-06-09 ENCOUNTER — Encounter (HOSPITAL_COMMUNITY)
Admission: RE | Disposition: A | Discharge status: Home / Self Care | Payer: Self-pay | Source: Ambulatory Visit | Attending: Orthopedic Surgery

## 2013-06-09 ENCOUNTER — Ambulatory Visit (HOSPITAL_COMMUNITY): Payer: Medicare HMO | Admitting: Certified Registered Nurse Anesthetist

## 2013-06-09 DIAGNOSIS — S42202A Unspecified fracture of upper end of left humerus, initial encounter for closed fracture: Secondary | ICD-10-CM

## 2013-06-09 DIAGNOSIS — E785 Hyperlipidemia, unspecified: Secondary | ICD-10-CM | POA: Insufficient documentation

## 2013-06-09 DIAGNOSIS — W19XXXA Unspecified fall, initial encounter: Secondary | ICD-10-CM | POA: Insufficient documentation

## 2013-06-09 DIAGNOSIS — E876 Hypokalemia: Secondary | ICD-10-CM | POA: Insufficient documentation

## 2013-06-09 DIAGNOSIS — Z7982 Long term (current) use of aspirin: Secondary | ICD-10-CM | POA: Insufficient documentation

## 2013-06-09 DIAGNOSIS — M949 Disorder of cartilage, unspecified: Secondary | ICD-10-CM

## 2013-06-09 DIAGNOSIS — Z8781 Personal history of (healed) traumatic fracture: Secondary | ICD-10-CM | POA: Diagnosis present

## 2013-06-09 DIAGNOSIS — I10 Essential (primary) hypertension: Secondary | ICD-10-CM | POA: Diagnosis present

## 2013-06-09 DIAGNOSIS — E119 Type 2 diabetes mellitus without complications: Secondary | ICD-10-CM | POA: Insufficient documentation

## 2013-06-09 DIAGNOSIS — M899 Disorder of bone, unspecified: Secondary | ICD-10-CM | POA: Insufficient documentation

## 2013-06-09 DIAGNOSIS — M8580 Other specified disorders of bone density and structure, unspecified site: Secondary | ICD-10-CM | POA: Diagnosis present

## 2013-06-09 DIAGNOSIS — Z6832 Body mass index (BMI) 32.0-32.9, adult: Secondary | ICD-10-CM | POA: Insufficient documentation

## 2013-06-09 DIAGNOSIS — M810 Age-related osteoporosis without current pathological fracture: Secondary | ICD-10-CM | POA: Diagnosis present

## 2013-06-09 DIAGNOSIS — S42293A Other displaced fracture of upper end of unspecified humerus, initial encounter for closed fracture: Secondary | ICD-10-CM | POA: Insufficient documentation

## 2013-06-09 DIAGNOSIS — D62 Acute posthemorrhagic anemia: Secondary | ICD-10-CM | POA: Insufficient documentation

## 2013-06-09 DIAGNOSIS — M1990 Unspecified osteoarthritis, unspecified site: Secondary | ICD-10-CM | POA: Insufficient documentation

## 2013-06-09 HISTORY — PX: ORIF HUMERUS FRACTURE: SHX2126

## 2013-06-09 HISTORY — DX: Unspecified osteoarthritis, unspecified site: M19.90

## 2013-06-09 LAB — GLUCOSE, CAPILLARY
GLUCOSE-CAPILLARY: 146 mg/dL — AB (ref 70–99)
GLUCOSE-CAPILLARY: 103 mg/dL — AB (ref 70–99)
GLUCOSE-CAPILLARY: 141 mg/dL — AB (ref 70–99)
GLUCOSE-CAPILLARY: 131 mg/dL — AB (ref 70–99)

## 2013-06-09 LAB — CBC
HCT: 24.5 % — ABNORMAL LOW (ref 36.0–46.0)
HEMATOCRIT: 38.8 % (ref 36.0–46.0)
HEMOGLOBIN: 13.6 g/dL (ref 12.0–15.0)
HEMOGLOBIN: 8.6 g/dL — AB (ref 12.0–15.0)
MCH: 31.7 pg (ref 26.0–34.0)
MCH: 31.9 pg (ref 26.0–34.0)
MCHC: 35.1 g/dL (ref 30.0–36.0)
MCHC: 35.1 g/dL (ref 30.0–36.0)
MCV: 90.4 fL (ref 78.0–100.0)
MCV: 90.7 fL (ref 78.0–100.0)
Platelets: 351 10*3/uL (ref 150–400)
Platelets: 240 10*3/uL (ref 150–400)
RBC: 4.29 MIL/uL (ref 3.87–5.11)
RBC: 2.7 MIL/uL — AB (ref 3.87–5.11)
RDW: 13 % (ref 11.5–15.5)
RDW: 13.2 % (ref 11.5–15.5)
WBC: 6.4 10*3/uL (ref 4.0–10.5)
WBC: 10.7 10*3/uL — ABNORMAL HIGH (ref 4.0–10.5)

## 2013-06-09 LAB — BASIC METABOLIC PANEL
BUN: 23 mg/dL (ref 6–23)
CHLORIDE: 95 meq/L — AB (ref 96–112)
CO2: 27 meq/L (ref 19–32)
CREATININE: 0.86 mg/dL (ref 0.50–1.10)
Calcium: 10.3 mg/dL (ref 8.4–10.5)
GFR calc Af Amer: 80 mL/min — ABNORMAL LOW (ref >90)
GFR calc non Af Amer: 69 mL/min — ABNORMAL LOW (ref >90)
Glucose, Bld: 137 mg/dL — ABNORMAL HIGH (ref 70–99)
POTASSIUM: 3.5 meq/L — AB (ref 3.7–5.3)
Sodium: 137 mEq/L (ref 137–147)

## 2013-06-09 LAB — CREATININE, SERUM
CREATININE: 0.48 mg/dL — AB (ref 0.50–1.10)
GFR calc non Af Amer: >90 mL/min (ref >90)

## 2013-06-09 SURGERY — OPEN REDUCTION INTERNAL FIXATION (ORIF) PROXIMAL HUMERUS FRACTURE
Anesthesia: General | Laterality: Left

## 2013-06-09 MED ORDER — METHOCARBAMOL 1000 MG/10ML IJ SOLN
500.0000 mg | Freq: Four times a day (QID) | INTRAVENOUS | Status: DC | PRN
  Filled 2013-06-09: qty 5

## 2013-06-09 MED ORDER — MIDAZOLAM HCL 2 MG/2ML IJ SOLN
INTRAMUSCULAR | Status: AC
  Filled 2013-06-09: qty 2

## 2013-06-09 MED ORDER — LACTATED RINGERS IV SOLN
INTRAVENOUS | Status: DC
  Administered 2013-06-09: 50 mL/h via INTRAVENOUS

## 2013-06-09 MED ORDER — ASPIRIN EC 81 MG PO TBEC
81.0000 mg | DELAYED_RELEASE_TABLET | Freq: Every day | ORAL | Status: DC
  Administered 2013-06-10 – 2013-06-11 (×2): 81 mg via ORAL
  Filled 2013-06-09 (×2): qty 1

## 2013-06-09 MED ORDER — GLYCOPYRROLATE 0.2 MG/ML IJ SOLN
INTRAMUSCULAR | Status: DC | PRN
  Administered 2013-06-09: 0.6 mg via INTRAVENOUS

## 2013-06-09 MED ORDER — METOCLOPRAMIDE HCL 5 MG/ML IJ SOLN: 5.0000 mg | Freq: Three times a day (TID) | INTRAMUSCULAR | Status: DC | PRN

## 2013-06-09 MED ORDER — HYDROMORPHONE HCL PF 1 MG/ML IJ SOLN: 0.2500 mg | INTRAMUSCULAR | Status: DC | PRN

## 2013-06-09 MED ORDER — ONDANSETRON HCL 4 MG/2ML IJ SOLN: 4.0000 mg | Freq: Four times a day (QID) | INTRAMUSCULAR | Status: DC | PRN

## 2013-06-09 MED ORDER — MENTHOL 3 MG MT LOZG: 1.0000 | LOZENGE | OROMUCOSAL | Status: DC | PRN

## 2013-06-09 MED ORDER — VITAMIN B-12 1000 MCG PO TABS
1000.0000 ug | ORAL_TABLET | Freq: Every day | ORAL | Status: DC
  Administered 2013-06-10 – 2013-06-11 (×2): 1000 ug via ORAL
  Filled 2013-06-09 (×2): qty 1

## 2013-06-09 MED ORDER — BUPIVACAINE-EPINEPHRINE (PF) 0.5% -1:200000 IJ SOLN
INTRAMUSCULAR | Status: DC | PRN
  Administered 2013-06-09: 30 mL

## 2013-06-09 MED ORDER — LIDOCAINE HCL (CARDIAC) 20 MG/ML IV SOLN
INTRAVENOUS | Status: AC
  Filled 2013-06-09: qty 5

## 2013-06-09 MED ORDER — SUCCINYLCHOLINE CHLORIDE 20 MG/ML IJ SOLN
INTRAMUSCULAR | Status: AC
  Filled 2013-06-09: qty 1

## 2013-06-09 MED ORDER — OXYCODONE HCL 5 MG PO TABS
5.0000 mg | ORAL_TABLET | ORAL | Status: DC | PRN
  Administered 2013-06-10 (×2): 10 mg via ORAL
  Administered 2013-06-10: 5 mg via ORAL
  Filled 2013-06-09: qty 2
  Filled 2013-06-09: qty 1
  Filled 2013-06-09: qty 2

## 2013-06-09 MED ORDER — CEFAZOLIN SODIUM-DEXTROSE 2-3 GM-% IV SOLR
2.0000 g | Freq: Once | INTRAVENOUS | Status: AC
  Administered 2013-06-09: 2 g via INTRAVENOUS

## 2013-06-09 MED ORDER — ONDANSETRON HCL 4 MG/2ML IJ SOLN
INTRAMUSCULAR | Status: AC
  Filled 2013-06-09: qty 2

## 2013-06-09 MED ORDER — OXYCODONE HCL 5 MG/5ML PO SOLN: 5.0000 mg | Freq: Once | ORAL | Status: DC | PRN

## 2013-06-09 MED ORDER — PHENYLEPHRINE HCL 10 MG/ML IJ SOLN
INTRAMUSCULAR | Status: DC | PRN
  Administered 2013-06-09: 80 ug via INTRAVENOUS
  Administered 2013-06-09: 40 ug via INTRAVENOUS
  Administered 2013-06-09: 80 ug via INTRAVENOUS

## 2013-06-09 MED ORDER — PROMETHAZINE HCL 25 MG/ML IJ SOLN: 6.2500 mg | INTRAMUSCULAR | Status: DC | PRN

## 2013-06-09 MED ORDER — NEOSTIGMINE METHYLSULFATE 10 MG/10ML IV SOLN
INTRAVENOUS | Status: DC | PRN
  Administered 2013-06-09: 3 mg via INTRAVENOUS

## 2013-06-09 MED ORDER — PHENYLEPHRINE 40 MCG/ML (10ML) SYRINGE FOR IV PUSH (FOR BLOOD PRESSURE SUPPORT)
PREFILLED_SYRINGE | INTRAVENOUS | Status: AC
  Filled 2013-06-09: qty 10

## 2013-06-09 MED ORDER — ONDANSETRON HCL 4 MG/2ML IJ SOLN
INTRAMUSCULAR | Status: DC | PRN
  Administered 2013-06-09: 4 mg via INTRAVENOUS

## 2013-06-09 MED ORDER — ASPIRIN 81 MG PO TBEC: 81.0000 mg | DELAYED_RELEASE_TABLET | Freq: Every day | ORAL | Status: DC

## 2013-06-09 MED ORDER — ACETAMINOPHEN 325 MG PO TABS: 650.0000 mg | ORAL_TABLET | Freq: Four times a day (QID) | ORAL | Status: DC | PRN

## 2013-06-09 MED ORDER — METOCLOPRAMIDE HCL 10 MG PO TABS: 5.0000 mg | ORAL_TABLET | Freq: Three times a day (TID) | ORAL | Status: DC | PRN

## 2013-06-09 MED ORDER — SODIUM CHLORIDE 0.9 % IR SOLN
Status: DC | PRN
  Administered 2013-06-09: 1000 mL

## 2013-06-09 MED ORDER — HYDROCHLOROTHIAZIDE 25 MG PO TABS
25.0000 mg | ORAL_TABLET | Freq: Every day | ORAL | Status: DC
  Administered 2013-06-09: 25 mg via ORAL
  Filled 2013-06-09 (×2): qty 1

## 2013-06-09 MED ORDER — MIDAZOLAM HCL 5 MG/5ML IJ SOLN
INTRAMUSCULAR | Status: DC | PRN
Start: 2013-06-09 — End: 2013-06-09
  Administered 2013-06-09: 2 mg via INTRAVENOUS

## 2013-06-09 MED ORDER — ACETAMINOPHEN 650 MG RE SUPP
650.0000 mg | Freq: Four times a day (QID) | RECTAL | Status: DC | PRN
Start: 2013-06-09 — End: 2013-06-11

## 2013-06-09 MED ORDER — PROPOFOL 10 MG/ML IV BOLUS
INTRAVENOUS | Status: DC | PRN
  Administered 2013-06-09: 100 mg via INTRAVENOUS

## 2013-06-09 MED ORDER — GEMFIBROZIL 600 MG PO TABS
600.0000 mg | ORAL_TABLET | Freq: Two times a day (BID) | ORAL | Status: DC
  Administered 2013-06-09 – 2013-06-11 (×5): 600 mg via ORAL
  Filled 2013-06-09 (×6): qty 1

## 2013-06-09 MED ORDER — MORPHINE SULFATE 2 MG/ML IJ SOLN: 1.0000 mg | INTRAMUSCULAR | Status: DC | PRN

## 2013-06-09 MED ORDER — INSULIN ASPART 100 UNIT/ML ~~LOC~~ SOLN
0.0000 [IU] | Freq: Three times a day (TID) | SUBCUTANEOUS | Status: DC
  Administered 2013-06-09 – 2013-06-10 (×2): 2 [IU] via SUBCUTANEOUS

## 2013-06-09 MED ORDER — FENTANYL CITRATE 0.05 MG/ML IJ SOLN
INTRAMUSCULAR | Status: DC | PRN
  Administered 2013-06-09: 50 ug via INTRAVENOUS

## 2013-06-09 MED ORDER — ENOXAPARIN SODIUM 40 MG/0.4ML ~~LOC~~ SOLN
40.0000 mg | SUBCUTANEOUS | Status: DC
  Administered 2013-06-10 – 2013-06-11 (×2): 40 mg via SUBCUTANEOUS
  Filled 2013-06-09 (×3): qty 0.4

## 2013-06-09 MED ORDER — PROPOFOL 10 MG/ML IV BOLUS
INTRAVENOUS | Status: AC
  Filled 2013-06-09: qty 20

## 2013-06-09 MED ORDER — METHOCARBAMOL 500 MG PO TABS: 500.0000 mg | ORAL_TABLET | Freq: Four times a day (QID) | ORAL | Status: DC | PRN

## 2013-06-09 MED ORDER — MEPERIDINE HCL 25 MG/ML IJ SOLN: 6.2500 mg | INTRAMUSCULAR | Status: DC | PRN

## 2013-06-09 MED ORDER — PHENOL 1.4 % MT LIQD: 1.0000 | OROMUCOSAL | Status: DC | PRN

## 2013-06-09 MED ORDER — ONDANSETRON HCL 4 MG PO TABS: 4.0000 mg | ORAL_TABLET | Freq: Four times a day (QID) | ORAL | Status: DC | PRN

## 2013-06-09 MED ORDER — ROCURONIUM BROMIDE 100 MG/10ML IV SOLN
INTRAVENOUS | Status: DC | PRN
Start: 2013-06-09 — End: 2013-06-09
  Administered 2013-06-09: 40 mg via INTRAVENOUS

## 2013-06-09 MED ORDER — OXYCODONE HCL 5 MG PO TABS: 5.0000 mg | ORAL_TABLET | Freq: Once | ORAL | Status: DC | PRN

## 2013-06-09 MED ORDER — FENTANYL CITRATE 0.05 MG/ML IJ SOLN
INTRAMUSCULAR | Status: AC
  Filled 2013-06-09: qty 5

## 2013-06-09 MED ORDER — METFORMIN HCL 500 MG PO TABS
500.0000 mg | ORAL_TABLET | Freq: Two times a day (BID) | ORAL | Status: DC
  Administered 2013-06-09 – 2013-06-11 (×4): 500 mg via ORAL
  Filled 2013-06-09 (×6): qty 1

## 2013-06-09 MED ORDER — LIDOCAINE HCL (CARDIAC) 20 MG/ML IV SOLN
INTRAVENOUS | Status: DC | PRN
  Administered 2013-06-09: 40 mg via INTRAVENOUS

## 2013-06-09 MED ORDER — CEFAZOLIN SODIUM 1-5 GM-% IV SOLN
1.0000 g | Freq: Four times a day (QID) | INTRAVENOUS | Status: AC
  Administered 2013-06-09 – 2013-06-10 (×3): 1 g via INTRAVENOUS
  Filled 2013-06-09 (×4): qty 50

## 2013-06-09 MED ORDER — MIDAZOLAM HCL 2 MG/2ML IJ SOLN: 0.5000 mg | Freq: Once | INTRAMUSCULAR | Status: DC | PRN

## 2013-06-09 MED ORDER — OXYCODONE-ACETAMINOPHEN 5-325 MG PO TABS
1.0000 | ORAL_TABLET | Freq: Four times a day (QID) | ORAL | Status: DC | PRN
  Administered 2013-06-09: 1 via ORAL
  Filled 2013-06-09: qty 1

## 2013-06-09 MED ORDER — LACTATED RINGERS IV SOLN
INTRAVENOUS | Status: DC | PRN
  Administered 2013-06-09 (×3): via INTRAVENOUS

## 2013-06-09 MED ORDER — SODIUM CHLORIDE 0.9 % IJ SOLN
INTRAMUSCULAR | Status: AC
  Filled 2013-06-09: qty 10

## 2013-06-09 MED ORDER — ROCURONIUM BROMIDE 50 MG/5ML IV SOLN
INTRAVENOUS | Status: AC
  Filled 2013-06-09: qty 1

## 2013-06-09 MED ORDER — EPHEDRINE SULFATE 50 MG/ML IJ SOLN
INTRAMUSCULAR | Status: AC
  Filled 2013-06-09: qty 1

## 2013-06-09 MED ORDER — DOCUSATE SODIUM 100 MG PO CAPS
100.0000 mg | ORAL_CAPSULE | Freq: Two times a day (BID) | ORAL | Status: DC
  Administered 2013-06-09 – 2013-06-11 (×5): 100 mg via ORAL
  Filled 2013-06-09 (×6): qty 1

## 2013-06-09 MED ORDER — DIPHENHYDRAMINE HCL 12.5 MG/5ML PO ELIX: 12.5000 mg | ORAL_SOLUTION | ORAL | Status: DC | PRN

## 2013-06-09 MED ORDER — VITAMIN C 500 MG PO TABS
500.0000 mg | ORAL_TABLET | Freq: Every day | ORAL | Status: DC
  Administered 2013-06-09 – 2013-06-11 (×3): 500 mg via ORAL
  Filled 2013-06-09 (×3): qty 1

## 2013-06-09 SURGICAL SUPPLY — 78 items
BANDAGE ELASTIC 3 VELCRO ST LF (GAUZE/BANDAGES/DRESSINGS) ×3 IMPLANT
BANDAGE ELASTIC 4 VELCRO ST LF (GAUZE/BANDAGES/DRESSINGS) ×3 IMPLANT
BANDAGE GAUZE ELAST BULKY 4 IN (GAUZE/BANDAGES/DRESSINGS) ×6 IMPLANT
BENZOIN TINCTURE PRP APPL 2/3 (GAUZE/BANDAGES/DRESSINGS) ×6 IMPLANT
BIT DRILL 2.8X4 QC CORT (BIT) ×3 IMPLANT
BIT DRILL 4 LONG FAST STEP (BIT) ×3 IMPLANT
BIT DRILL 4 SHORT FAST STEP (BIT) ×3 IMPLANT
BONE CHIP PRESERV 20CC (Bone Implant) ×3 IMPLANT
BRUSH SCRUB DISP (MISCELLANEOUS) ×6 IMPLANT
COVER SURGICAL LIGHT HANDLE (MISCELLANEOUS) ×6 IMPLANT
DRAPE C-ARM 42X72 X-RAY (DRAPES) ×3 IMPLANT
DRAPE C-ARMOR (DRAPES) ×3 IMPLANT
DRAPE INCISE IOBAN 66X45 STRL (DRAPES) IMPLANT
DRAPE ORTHO SPLIT 77X108 STRL (DRAPES) ×4
DRAPE SURG 17X11 SM STRL (DRAPES) ×6 IMPLANT
DRAPE SURG ORHT 6 SPLT 77X108 (DRAPES) ×2 IMPLANT
DRAPE U-SHAPE 47X51 STRL (DRAPES) ×6 IMPLANT
DRSG ADAPTIC 3X8 NADH LF (GAUZE/BANDAGES/DRESSINGS) ×3 IMPLANT
DRSG MEPILEX BORDER 4X8 (GAUZE/BANDAGES/DRESSINGS) ×3 IMPLANT
DRSG PAD ABDOMINAL 8X10 ST (GAUZE/BANDAGES/DRESSINGS) ×3 IMPLANT
ELECT REM PT RETURN 9FT ADLT (ELECTROSURGICAL) ×3
ELECTRODE REM PT RTRN 9FT ADLT (ELECTROSURGICAL) ×1 IMPLANT
EVACUATOR 1/8 PVC DRAIN (DRAIN) IMPLANT
GLOVE BIO SURGEON STRL SZ7.5 (GLOVE) ×3 IMPLANT
GLOVE BIO SURGEON STRL SZ8 (GLOVE) ×3 IMPLANT
GLOVE BIOGEL PI IND STRL 7.5 (GLOVE) ×1 IMPLANT
GLOVE BIOGEL PI IND STRL 8 (GLOVE) ×1 IMPLANT
GLOVE BIOGEL PI INDICATOR 7.5 (GLOVE) ×2
GLOVE BIOGEL PI INDICATOR 8 (GLOVE) ×2
GOWN STRL REUS W/ TWL LRG LVL3 (GOWN DISPOSABLE) ×2 IMPLANT
GOWN STRL REUS W/ TWL XL LVL3 (GOWN DISPOSABLE) ×1 IMPLANT
GOWN STRL REUS W/TWL 2XL LVL3 (GOWN DISPOSABLE) ×3 IMPLANT
GOWN STRL REUS W/TWL LRG LVL3 (GOWN DISPOSABLE) ×4
GOWN STRL REUS W/TWL XL LVL3 (GOWN DISPOSABLE) ×2
KIT BASIN OR (CUSTOM PROCEDURE TRAY) ×3 IMPLANT
KIT ROOM TURNOVER OR (KITS) ×3 IMPLANT
LOOP VESSEL MAXI BLUE (MISCELLANEOUS) IMPLANT
MANIFOLD NEPTUNE II (INSTRUMENTS) ×3 IMPLANT
NS IRRIG 1000ML POUR BTL (IV SOLUTION) ×3 IMPLANT
PACK TOTAL JOINT (CUSTOM PROCEDURE TRAY) ×3 IMPLANT
PAD ARMBOARD 7.5X6 YLW CONV (MISCELLANEOUS) ×6 IMPLANT
PEG STND 4.0X32.5MM (Orthopedic Implant) ×6 IMPLANT
PEG STND 4.0X35MM (Orthopedic Implant) ×3 IMPLANT
PEG STND 4.0X37.5MM (Orthopedic Implant) ×3 IMPLANT
PEG STND 4.0X40MM (Orthopedic Implant) ×3 IMPLANT
PEG STND 4.0X42.5MM (Orthopedic Implant) ×3 IMPLANT
PEG THREADED 4.0X37.5MM (Orthopedic Implant) ×3 IMPLANT
PEGSTD 4.0X32.5MM (Orthopedic Implant) ×2 IMPLANT
PEGSTD 4.0X35MM (Orthopedic Implant) ×1 IMPLANT
PEGSTD 4.0X37.5MM (Orthopedic Implant) ×1 IMPLANT
PEGSTD 4.0X40MM (Orthopedic Implant) ×1 IMPLANT
PEGSTD 4.0X42.5MM (Orthopedic Implant) ×1 IMPLANT
PIN GUIDE SHOULDER 2.0MM (PIN) ×3 IMPLANT
PLATE SHOULDER S3 3HOLE LT (Plate) ×3 IMPLANT
SCREW LOCK 90D ANGLED 3.8X26 (Screw) ×3 IMPLANT
SCREW MULTIDIR SS 3.8X28 HUMRL (Screw) ×3 IMPLANT
SLING ARM FOAM STRAP LRG (SOFTGOODS) ×3 IMPLANT
SPONGE GAUZE 4X4 12PLY (GAUZE/BANDAGES/DRESSINGS) ×6 IMPLANT
SPONGE LAP 18X18 X RAY DECT (DISPOSABLE) IMPLANT
STAPLER VISISTAT 35W (STAPLE) ×3 IMPLANT
STOCKINETTE IMPERVIOUS LG (DRAPES) ×3 IMPLANT
SUCTION FRAZIER TIP 10 FR DISP (SUCTIONS) ×3 IMPLANT
SUT BONE WAX W31G (SUTURE) ×3 IMPLANT
SUT ETHIBOND 5 LR DA (SUTURE) ×3 IMPLANT
SUT FIBERWIRE #2 38 T-5 BLUE (SUTURE)
SUT PDS AB 2-0 CT1 27 (SUTURE) IMPLANT
SUT VIC AB 0 CT1 27 (SUTURE) ×4
SUT VIC AB 0 CT1 27XBRD ANBCTR (SUTURE) ×2 IMPLANT
SUT VIC AB 2-0 CT1 27 (SUTURE) ×4
SUT VIC AB 2-0 CT1 TAPERPNT 27 (SUTURE) ×2 IMPLANT
SUT VIC AB 2-0 CT3 27 (SUTURE) IMPLANT
SUTURE FIBERWR #2 38 T-5 BLUE (SUTURE) IMPLANT
SYR 5ML LL (SYRINGE) IMPLANT
TOWEL OR 17X24 6PK STRL BLUE (TOWEL DISPOSABLE) ×3 IMPLANT
TOWEL OR 17X26 10 PK STRL BLUE (TOWEL DISPOSABLE) ×6 IMPLANT
TRAY FOLEY CATH 16FRSI W/METER (SET/KITS/TRAYS/PACK) IMPLANT
WATER STERILE IRR 1000ML POUR (IV SOLUTION) ×3 IMPLANT
YANKAUER SUCT BULB TIP NO VENT (SUCTIONS) IMPLANT

## 2013-06-09 NOTE — Brief Op Note (Signed)
06/09/2013  11:40 AM  PATIENT:  Amy Fry  68 y.o. female  PRE-OPERATIVE DIAGNOSIS:  LEFT PROXIMAL HUMERUS FRACTURE  POST-OPERATIVE DIAGNOSIS:  LEFT PROXIMAL HUMERUS FRACTURE  PROCEDURE:  Procedure(s): OPEN REDUCTION INTERNAL FIXATION (ORIF) PROXIMAL HUMERUS FRACTURE (Left)  SURGEON:  Surgeon(s) and Role:    * Rozanna Box, MD - Primary  PHYSICIAN ASSISTANT: Ainsley Spinner, PA-C  ANESTHESIA:   general  I/O:  Total I/O In: 2000 [I.V.:2000] Out: 575 [Urine:175; Blood:400]  SPECIMEN:  No Specimen  TOURNIQUET:  * No tourniquets in log *  DICTATION: .Other Dictation: Dictation Number [id number]

## 2013-06-09 NOTE — Anesthesia Preprocedure Evaluation (Addendum)
Anesthesia Evaluation  Patient identified by MRN, date of birth, ID band Patient awake    Reviewed: Allergy & Precautions, H&P , NPO status , Patient's Chart, lab work & pertinent test results, reviewed documented beta blocker date and time   History of Anesthesia Complications Negative for: history of anesthetic complications  Airway Mallampati: II TM Distance: >3 FB Neck ROM: Full    Dental  (+) Teeth Intact, Dental Advisory Given   Pulmonary neg pulmonary ROS,  breath sounds clear to auscultation  Pulmonary exam normal       Cardiovascular hypertension, Pt. on medications - anginaRhythm:Regular Rate:Normal     Neuro/Psych negative neurological ROS     GI/Hepatic negative GI ROS, Neg liver ROS,   Endo/Other  diabetes (glu 146), Type 2, Oral Hypoglycemic AgentsMorbid obesity  Renal/GU negative Renal ROS     Musculoskeletal  (+) Arthritis -, Osteoarthritis,    Abdominal (+) + obese,   Peds  Hematology   Anesthesia Other Findings   Reproductive/Obstetrics                         Anesthesia Physical Anesthesia Plan  ASA: II  Anesthesia Plan: General   Post-op Pain Management:    Induction: Intravenous  Airway Management Planned: Oral ETT  Additional Equipment:   Intra-op Plan:   Post-operative Plan: Extubation in OR  Informed Consent: I have reviewed the patients History and Physical, chart, labs and discussed the procedure including the risks, benefits and alternatives for the proposed anesthesia with the patient or authorized representative who has indicated his/her understanding and acceptance.   Dental advisory given  Plan Discussed with: CRNA, Anesthesiologist and Surgeon  Anesthesia Plan Comments: (Plan routine monitors, GETA with interscalene block for post op analgesia)       Anesthesia Quick Evaluation

## 2013-06-09 NOTE — Anesthesia Procedure Notes (Addendum)
Anesthesia Regional Block:  Interscalene brachial plexus block  Pre-Anesthetic Checklist: ,, timeout performed, Correct Patient, Correct Site, Correct Laterality, Correct Procedure, Correct Position, site marked, Risks and benefits discussed,  Surgical consent,  Pre-op evaluation,  At surgeon's request and post-op pain management  Laterality: Left and Upper  Prep: chloraprep       Needles:  Injection technique: Single-shot  Needle Type: Echogenic Stimulator Needle      Needle Gauge: 22 and 22 G    Additional Needles:  Procedures: nerve stimulator Interscalene brachial plexus block  Nerve Stimulator or Paresthesia:  Response: forearm twitch, 0.44 mA, 0.1 ms,   Additional Responses:   Narrative:  Start time: 06/09/2013 8:08 AM End time: 06/09/2013 8:14 AM Injection made incrementally with aspirations every 5 mL.  Performed by: Personally  Anesthesiologist: Jenita Seashore, MD  Additional Notes: Pt identified in Holding room.  Monitors applied. Working IV access confirmed. Sterile prep L neck.  #22ga PNS to forearm twitch at 0.32mA threshold.  30cc 0.5% Bupivacaine with 1:200k epi injected incrementally after negative test dose.  Patient asymptomatic, VSS, no heme aspirated, tolerated well.  Jenita Seashore, MD   Procedure Name: Intubation Date/Time: 06/09/2013 8:27 AM Performed by: Carney Living Pre-anesthesia Checklist: Patient identified, Emergency Drugs available, Suction available, Patient being monitored and Timeout performed Patient Re-evaluated:Patient Re-evaluated prior to inductionOxygen Delivery Method: Circle system utilized Preoxygenation: Pre-oxygenation with 100% oxygen Intubation Type: IV induction Ventilation: Mask ventilation without difficulty Laryngoscope Size: Mac and 4 Grade View: Grade II Tube type: Oral Tube size: 7.5 mm Number of attempts: 1 Airway Equipment and Method: Stylet Placement Confirmation: ETT inserted through vocal cords under direct  vision,  positive ETCO2 and breath sounds checked- equal and bilateral Secured at: 22 cm Tube secured with: Tape Dental Injury: Teeth and Oropharynx as per pre-operative assessment

## 2013-06-09 NOTE — Transfer of Care (Signed)
Immediate Anesthesia Transfer of Care Note  Patient: Amy Fry  Procedure(s) Performed: Procedure(s): OPEN REDUCTION INTERNAL FIXATION (ORIF) PROXIMAL HUMERUS FRACTURE (Left)  Patient Location: PACU  Anesthesia Type:GA combined with regional for post-op pain  Level of Consciousness: awake, alert , oriented and patient cooperative  Airway & Oxygen Therapy: Patient Spontanous Breathing and Patient connected to nasal cannula oxygen  Post-op Assessment: Report given to PACU RN, Post -op Vital signs reviewed and stable and Patient moving all extremities  Post vital signs: Reviewed and stable  Complications: No apparent anesthesia complications

## 2013-06-09 NOTE — Progress Notes (Signed)
Report given to Rachael Darby as caregiver

## 2013-06-09 NOTE — H&P (Signed)
I saw and examined the patient with Mr. Eddie Dibbles, communicating the findings and plan noted above.  I discussed with the patient the risks and benefits of surgery, including the possibility of infection, nerve injury, vessel injury, wound breakdown, arthritis, symptomatic hardware, DVT/ PE, loss of motion, and need for further surgery among others.  She understood these risks and wished to proceed.  [place], MD [facility] [phone] (p)

## 2013-06-09 NOTE — Anesthesia Postprocedure Evaluation (Signed)
  Anesthesia Post-op Note  Patient: Amy Fry  Procedure(s) Performed: Procedure(s): OPEN REDUCTION INTERNAL FIXATION (ORIF) PROXIMAL HUMERUS FRACTURE (Left)  Patient Location: PACU  Anesthesia Type:GA combined with regional for post-op pain  Level of Consciousness: awake, alert , oriented and patient cooperative  Airway and Oxygen Therapy: Patient Spontanous Breathing and Patient connected to nasal cannula oxygen  Post-op Pain: none  Post-op Assessment: Post-op Vital signs reviewed, Patient's Cardiovascular Status Stable, Respiratory Function Stable, Patent Airway, No signs of Nausea or vomiting and Pain level controlled  Post-op Vital Signs: Reviewed and stable  Last Vitals:  Filed Vitals:   06/09/13 1316  BP:   Pulse:   Temp: 36.7 C  Resp:     Complications: No apparent anesthesia complications

## 2013-06-10 LAB — CBC
HCT: 32.7 % — ABNORMAL LOW (ref 36.0–46.0)
Hemoglobin: 11.3 g/dL — ABNORMAL LOW (ref 12.0–15.0)
MCH: 31.3 pg (ref 26.0–34.0)
MCHC: 34.6 g/dL (ref 30.0–36.0)
MCV: 90.6 fL (ref 78.0–100.0)
PLATELETS: 330 10*3/uL (ref 150–400)
RBC: 3.61 MIL/uL — ABNORMAL LOW (ref 3.87–5.11)
RDW: 13.1 % (ref 11.5–15.5)
WBC: 7.5 10*3/uL (ref 4.0–10.5)

## 2013-06-10 LAB — GLUCOSE, CAPILLARY
GLUCOSE-CAPILLARY: 141 mg/dL — AB (ref 70–99)
GLUCOSE-CAPILLARY: 138 mg/dL — AB (ref 70–99)
Glucose-Capillary: 127 mg/dL — ABNORMAL HIGH (ref 70–99)
Glucose-Capillary: 137 mg/dL — ABNORMAL HIGH (ref 70–99)

## 2013-06-10 LAB — BASIC METABOLIC PANEL
BUN: 13 mg/dL (ref 6–23)
CALCIUM: 9 mg/dL (ref 8.4–10.5)
CO2: 27 mEq/L (ref 19–32)
Chloride: 96 mEq/L (ref 96–112)
Creatinine, Ser: 0.71 mg/dL (ref 0.50–1.10)
GFR, EST NON AFRICAN AMERICAN: 88 mL/min — AB (ref >90)
Glucose, Bld: 135 mg/dL — ABNORMAL HIGH (ref 70–99)
Potassium: 2.9 mEq/L — CL (ref 3.7–5.3)
Sodium: 138 mEq/L (ref 137–147)

## 2013-06-10 LAB — MAGNESIUM: Magnesium: 1.7 mg/dL (ref 1.5–2.5)

## 2013-06-10 LAB — POTASSIUM: POTASSIUM: 2.9 meq/L — AB (ref 3.7–5.3)

## 2013-06-10 MED ORDER — POTASSIUM CHLORIDE CRYS ER 20 MEQ PO TBCR
40.0000 meq | EXTENDED_RELEASE_TABLET | Freq: Two times a day (BID) | ORAL | Status: DC
  Administered 2013-06-10 – 2013-06-11 (×3): 40 meq via ORAL
  Filled 2013-06-10 (×4): qty 2

## 2013-06-10 NOTE — Op Note (Signed)
[name], [name]              ACCOUNT NO.:  [id number]  MEDICAL RECORD NO.:  [id number]  LOCATION:  [id number]                        FACILITY:  [facility]  PHYSICIAN:  Astrid Divine. Marcelino Scot, M.D. DATE OF BIRTH:  11-29-1945  DATE OF PROCEDURE:  06/09/2013 DATE OF DISCHARGE:                              OPERATIVE REPORT   PREOPERATIVE DIAGNOSIS:  Displaced left 3-part proximal humerus fracture.  POSTOPERATIVE DIAGNOSIS:  Displaced left 3-part proximal humerus fracture.  PROCEDURE:  Open reduction and internal fixation of left proximal humerus.  SURGEON:  Astrid Divine. Marcelino Scot, M.D.  ASSISTANT:  [name], PA-C.  ANESTHESIA:  General.  COMPLICATIONS:  None.  I/O:  2000 mL of crystalloid, UOP 175 mL, EBL 200.  DISPOSITION:  To PACU.  CONDITION:  Stable.  BRIEF SUMMARY AND INDICATION FOR PROCEDURE:  Amy Fry is a 68 year old right-hand dominant female who sustained a left proximal humerus fracture in a fall.  She now presents for definitive repair.  I discussed with her the risks and benefits of surgery including the possibility of infection, nerve injury, vessel injury, failure of the repair, need for conversion to hemiarthroplasty, anesthetic complications including heart attack, stroke, and many others, in addition to loss of motion, avascular necrosis, and the possibility of screw penetration into the humeral head and joint.  The patient understood these risks as did her husband, with whom I also spoke and they wished to proceed with operative repair.  BRIEF SUMMARY OF PROCEDURE:  Amy Fry was given preoperative antibiotics, taken to the operating room where general anesthesia was induced.  Her left shoulder was prepped and draped in usual sterile fashion.  Standard deltopectoral approach was made, identifying the cephalic vein, retracting laterally with the deltoid.  The clavipectoral fascia was incised to reveal the biceps tendon, which was tracked up to the head  to establish rotation.  The shaft segment and greater tuberosity segment were developed.  The greater tuberosity secured with multiple #2 FiberWire sutures using Mason-Allen technique.  The anatomic head was then manipulated into position with a bone tamp and graft was placed underneath this.  With control of the tuberosity and the shaft and head, we were able to restore appropriate head-shaft angle.  This was pinned provisionally with K-wires.  The plate was affixed, secured to the shaft, and then multiple pegs into the head, 2 screws into the shaft, and then a FiberWire repair of the tuberosities.  The final construct moved as 1 unit and multiple x-rays showed appropriate reduction, hardware trajectory and length.  Wound was irrigated.  The deltopectoral reapproximated with 0 Vicryl, 2-0 for the subcu, and 3-0 nylon for the skin.  Sterile gently compressive dressing was applied. The patient was awakened from anesthesia and transported to the PACU in stable condition.  Ainsley Spinner, PA-C, assisted me throughout and was necessary to hold and maintain reduction during provisional fixation and also assisted with removal and placement of some definitive fixation and also with wound closure.  PROGNOSIS:  Amy Fry will begin immediate pendulum and transition to gentle passive motion.  We anticipate transition to __________ at 4 to 6 weeks and __________ depending upon examination and x-rays.     Legrand Como  Shari Heritage, M.D.     MHH/MEDQ  D:  06/09/2013  T:  06/10/2013  Job:  [id number]

## 2013-06-10 NOTE — Evaluation (Signed)
Occupational Therapy Evaluation Patient Details Name: Amy Fry MRN: [id number] DOB: Dec 26, 1945 Today's Date: 06/10/2013    History of Present Illness s/p open reduction and internal fixation of left proximal   Clinical Impression   Pt admitted with the above diagnoses and presents with below problem list. Pt will benefit from continued acute OT to address the below listed deficits and maximize independence with basic ADLs prior to d/c home with spouse. Pt limited this session by lethargy and c/o pain. Pt had eyes closed most of session, stated she was awake. Recommend PT eval as pt demonstrated poor standing balance, relying on support of recliner arm with right hand. Stood with fair toleration for 1 minute with encouragement provided by therapist. Handout and demonstration of exercises provided. Will attempt to see pt again to participate in exercises as appropriate.       Follow Up Recommendations  Supervision/Assistance - 24 hour    Equipment Recommendations  3 in 1 bedside comode    Recommendations for Other Services PT consult     Precautions / Restrictions Precautions Precautions: Shoulder Type of Shoulder Precautions: NWB, No AROM of L shoulder Shoulder Interventions: Shoulder sling/immobilizer;Off for dressing/bathing/exercises Precaution Booklet Issued: Yes (comment) Precaution Comments: provided shoulder protocol and reviewed Required Braces or Orthoses: Sling Restrictions Weight Bearing Restrictions: Yes Other Position/Activity Restrictions: NWB LUE      Mobility Bed Mobility               General bed mobility comments: not assessed - pt in recliner at start and end of session  Transfers Overall transfer level: Needs assistance   Transfers: Sit to/from Stand Sit to Stand: Min guard         General transfer comment: pt reluctant to stand and needing extra time    Balance Overall balance assessment: Needs assistance;History of  Falls Sitting-balance support: Single extremity supported;Feet supported Sitting balance-Leahy Scale: Fair     Standing balance support: Bilateral upper extremity supported;During functional activity Standing balance-Leahy Scale: Poor Standing balance comment: Pt held onto recliner arm with right hand for support in standing                            ADL Overall ADL's : Needs assistance/impaired Eating/Feeding: Set up;Sitting   Grooming: Set up;Sitting   Upper Body Bathing: Moderate assistance;Sitting   Lower Body Bathing: Sit to/from stand;With adaptive equipment;Minimal assistance   Upper Body Dressing : Minimal assistance;Sitting;Cueing for compensatory techniques   Lower Body Dressing: Minimal assistance;Sit to/from stand   Toilet Transfer: Min guard;Ambulation;Grab bars;Comfort height toilet   Toileting- Clothing Manipulation and Hygiene: Minimal assistance;Sit to/from stand   Tub/ Shower Transfer: Min guard;Ambulation;3 in 1   Functional mobility during ADLs: Min guard General ADL Comments: Pt limited this session by lethargy and c/o pain. Pt demonstrated fair toleration of standing 1 minute with encouragement with right hand on recliner for support. Education on techniques for safe completion of ADLs given.       Vision                     Perception     Praxis      Pertinent Vitals/Pain Stated 5/10 pain, lethargic, "not feeling good at all". Nursing aware     Hand Dominance Right   Extremity/Trunk Assessment Upper Extremity Assessment Upper Extremity Assessment: LUE deficits/detail LUE Deficits / Details: NWB, no AROM   Lower Extremity Assessment Lower Extremity Assessment: Generalized weakness  Communication Communication Communication: No difficulties   Cognition Arousal/Alertness: Lethargic Behavior During Therapy: Flat affect Overall Cognitive Status: Within Functional Limits for tasks assessed                      General Comments       Exercises Exercises: Shoulder     Shoulder Instructions Shoulder Instructions Donning/doffing shirt without moving shoulder: Minimal assistance Method for sponge bathing under operated UE: Supervision/safety Donning/doffing sling/immobilizer: Moderate assistance Correct positioning of sling/immobilizer: Supervision/safety Pendulum exercises (written home exercise program): Min-guard ROM for elbow, wrist and digits of operated UE: Supervision/safety Sling wearing schedule (on at all times/off for ADL's): Supervision/safety Proper positioning of operated UE when showering: Supervision/safety Positioning of UE while sleeping: [place] expects to be discharged to:: Private residence Living Arrangements: Spouse/significant other Available Help at Discharge: Available PRN/intermittently;Other (Comment) (spouse works part-time) Type of Home: [employer] Access: Stairs to enter Technical brewer of Steps: 2   Staples: One level     Bathroom Shower/Tub: Teacher, early years/pre: Handicapped height     Alger: Clinical cytogeneticist - 2 wheels          Prior Functioning/Environment Level of Independence: Independent             OT Diagnosis: Acute pain;Generalized weakness   OT Problem List: Decreased strength;Decreased range of motion;Decreased activity tolerance;Impaired balance (sitting and/or standing);Decreased safety awareness;Decreased knowledge of use of DME or AE;Decreased knowledge of precautions;Impaired UE functional use;Pain   OT Treatment/Interventions: Self-care/ADL training;Therapeutic exercise;DME and/or AE instruction;Therapeutic activities;Patient/family education;Balance training    OT Goals(Current goals can be found in the care plan section) Acute Rehab OT Goals Patient Stated Goal: not stated OT Goal Formulation: With patient/family Time For Goal Achievement:  06/17/13 Potential to Achieve Goals: Good ADL Goals Pt Will Perform Upper Body Bathing: with supervision;sitting Pt Will Perform Lower Body Bathing: with supervision;with adaptive equipment;sit to/from stand Pt Will Perform Upper Body Dressing: with supervision;sitting Pt Will Perform Lower Body Dressing: with supervision;with adaptive equipment;sit to/from stand Pt Will Transfer to Toilet: with supervision;ambulating (3n1 over toilet) Pt Will Perform Toileting - Clothing Manipulation and hygiene: with supervision;sit to/from stand Pt Will Perform Tub/Shower Transfer: with supervision;ambulating;3 in 1 Pt/caregiver will Perform Home Exercise Program: Left upper extremity;Independently;With written HEP provided  OT Frequency: Min 2X/week   Barriers to D/C: Decreased caregiver support  spouse works part-time; spouse reports they are still working on a plan for 24/7 care post d/c. Encourage spouse to try to solidify plans ASAP.       Co-evaluation              End of Session Equipment Utilized During Treatment: Gait belt;Other (comment) (sling) Nurse Communication: Other (comment) (need of sling with waist strap; pt lethargic)  Activity Tolerance: Patient limited by lethargy;Patient limited by pain Patient left: in chair;with family/visitor present;with call bell/phone within reach   Time: 1151-1220 OT Time Calculation (min): 29 min Charges:  OT General Charges $OT Visit: 1 Procedure OT Evaluation $Initial OT Evaluation Tier I: 1 Procedure OT Treatments $Self Care/Home Management : 8-22 mins G-Codes: OT G-codes **NOT FOR INPATIENT CLASS** Functional Limitation: Self care Self Care Current Status (E7035): At least 20 percent but less than 40 percent impaired, limited or restricted Self Care Goal Status (K0938): At least 1 percent but less than 20 percent impaired, limited or restricted  Hortencia Pilar 06/10/2013, 1:20 PM

## 2013-06-10 NOTE — Progress Notes (Signed)
[facility] Progress Note  Subjective  Not feeling well this am Did not get much rest yesterday  Pain mod to L shoulder Tolerated some solids this am Has not voided yet Has not been out of bed yet   Review of Systems  Constitutional: Negative for fever and chills.  Eyes: Negative for blurred vision.  Respiratory: Negative for shortness of breath and wheezing.   Cardiovascular: Negative for chest pain and palpitations.  Gastrointestinal: Negative for nausea, vomiting and abdominal pain.  Genitourinary: Negative for dysuria.  Musculoskeletal:       L shoulder pain   Neurological: Negative for headaches.       Tingling L arm same as pre-op      Objective   BP 124/60  Pulse 74  Temp(Src) 98.2 F (36.8 C) (Oral)  Resp 20  Ht 5\' 3"  (1.6 m)  Wt 83.008 kg (183 lb)  BMI 32.43 kg/m2  SpO2 96%  Intake/Output     05/26 0701 - 05/27 0700 05/27 0701 - 05/28 0700   P.O. 240    I.V. (mL/kg) 2500 (30.1)    Total Intake(mL/kg) 2740 (33)    Urine (mL/kg/hr) 240 (0.1)    Blood 400 (0.2)    Total Output 640     Net +2100            Labs Results for Amy Fry, Amy Fry (MRN [id number]) as of 06/10/2013 08:41  Ref. Range 06/10/2013 05:55  Sodium Latest Range: 137-147 mEq/L 138  Potassium Latest Range: 3.7-5.3 mEq/L 2.9 (LL)  Chloride Latest Range: 96-112 mEq/L 96  CO2 Latest Range: 19-32 mEq/L 27  BUN Latest Range: 6-23 mg/dL 13  Creatinine Latest Range: 0.50-1.10 mg/dL 0.71  Calcium Latest Range: 8.4-10.5 mg/dL 9.0  GFR calc non Af Amer Latest Range: >90 mL/min 88 (L)  GFR calc Af Amer Latest Range: >90 mL/min >90  Glucose Latest Range: 70-99 mg/dL 135 (H)   CBC pending   CBG (last 3)   Recent Labs  06/09/13 1640 06/09/13 2154 06/10/13 0637  GLUCAP 141* 131* 127*     Exam  Gen: awake and alert, NAD, lying comfortably in bed Lungs: clear anterior fields Cardiac: RRR, s1 and s2 Abd: soft, NTND, + Bs Ext:       Left Upper Extremity   Dressing  c/d/i  Ice pack in place  Sling fitting well  R/U/M motor and sensory functions intact  Ax sensation intact  Swelling well controlled  Ext warm  + radial pulse  Good perfusion distally     Assessment and Plan   POD/HD#: 1  68 y/o female s/p ORIF L proximal humeurs fracture   1. L proximal humerus fracture s/p ORIF   NWB L upper extremity   Shoulder pendulums only  Ok for AROM L elbow, forearm, wrist and hand  No lifting with L arm  Ice prn  Sling  OT eval  Dressing changes PRN  2. Pain management:  Optimize control  Pt has not received percocet since yesterday evening, did have some oxy IR 2x this am  Need to alternate oxy IR and percocet  IV morphine available for breakthrough pian   3. ABL anemia/Hemodynamics  CBC pending   4. Medical issues     Hypokalemia   Believe this to be multifactorial    Pt has had hypokalemia in the past    Pt received novolog around the time of lab draw, unclear if this was immediately before    Pt also on  HCTZ which can cause hypokalemia    Dc novolog, dc HCTZ    Repeat serum K, obtain serum Mg level    Check 12-lead ekg    Pt without symptoms at this point    HTN   Hold BP meds for now   5. DVT/PE prophylaxis:  SCDs  lovenox  6. ID:   Completed perioop abx  7. Metabolic Bone Disease:  Pt with hx of osteopenia  Check vitamin D levels  8. Activity:  PT/OT eval  9. FEN/Foley/Lines:  Advance diet as tolerated  Dc foley  NSL IVF   10. Impediments to fracture healing:  Osteopenia  DM  11. Dispo:  eval hypokalemia  Do not think IM consult warranted at this time  PT/OT evals  Possible dc home tomorrow    [name], PA-C [facility] [phone] (P) 06/10/2013 8:39 AM  **Disclaimer: This note may have been dictated with voice recognition software. Similar sounding words can inadvertently be transcribed and this note may contain transcription errors which may not have been corrected upon  publication of note.**

## 2013-06-10 NOTE — Progress Notes (Signed)
Occupational Therapy Treatment Patient Details Name: Amy Fry MRN: [id number] DOB: 01/10/1946 Today's Date: 06/10/2013    History of present illness s/p open reduction and internal fixation of left proximal   OT comments  Pt seen for OT treatment session to engage in exercise program. Pt more alert and engaging during session. Pt standing balance improved and completed in-room ambulation min guard. Pt completed exercises as detailed below.  Follow Up Recommendations  Supervision/Assistance - 24 hour    Equipment Recommendations  3 in 1 bedside comode    Recommendations for Other Services      Precautions / Restrictions Precautions Precautions: Shoulder Type of Shoulder Precautions: NWB, No AROM of L shoulder Shoulder Interventions: Shoulder sling/immobilizer;Off for dressing/bathing/exercises Precaution Booklet Issued: Yes (comment) Required Braces or Orthoses: Sling Restrictions Weight Bearing Restrictions: Yes Other Position/Activity Restrictions: NWB LUE       Mobility Bed Mobility Overal bed mobility: Needs Assistance Bed Mobility: Sidelying to Sit   Sidelying to sit: Min guard;HOB elevated       General bed mobility comments: with extra time and effort pt supine>EOB with HOB elevated.  Transfers Overall transfer level: Needs assistance   Transfers: Sit to/from Stand Sit to Stand: Min guard         General transfer comment: pt sit>from EOB, toilet, low chair all min guard    Balance Overall balance assessment: Needs assistance Sitting-balance support: Single extremity supported;Feet supported Sitting balance-Leahy Scale: Fair     Standing balance support: During functional activity Standing balance-Leahy Scale: Fair                     ADL                           Toilet Transfer: Min guard;Ambulation;Grab bars;Comfort height toilet   Toileting- Clothing Manipulation and Hygiene: Min guard;Sit to/from stand        Functional mobility during ADLs: Min guard General ADL Comments: Pt more alert and ambulatory this session. In-room ambulation to toilet and toilet transfer at min guard.       Vision                     Perception     Praxis      Cognition   Behavior During Therapy: [facility] for tasks assessed/performed Overall Cognitive Status: Within Functional Limits for tasks assessed                       Extremity/Trunk Assessment               Exercises Shoulder Exercises Pendulum Exercise: PROM;10 reps;Left;Standing Shoulder Flexion: PROM;10 reps;Standing Shoulder Extension: PROM;Left;10 reps;Standing Shoulder ABduction: PROM;Left;10 reps;Standing Elbow Flexion: Left;10 reps;Standing;AROM Elbow Extension: Left;10 reps;Standing;AROM Wrist Flexion: 10 reps;Left;Seated;AROM Wrist Extension: AROM;Left;10 reps;Seated Digit Composite Flexion: AROM;Left;10 reps;Seated Composite Extension: AROM;Left;10 reps;Seated   Shoulder Instructions       General Comments      Pertinent Vitals/ Pain       Mild pain LUE increased activity. respositioned at end of session.  Home Living                                          Prior Functioning/Environment              Frequency Min 2X/week  Progress Toward Goals  OT Goals(current goals can now be found in the care plan section)  Progress towards OT goals: Progressing toward goals  Acute Rehab OT Goals Patient Stated Goal: not stated OT Goal Formulation: With patient/family Time For Goal Achievement: 06/17/13 Potential to Achieve Goals: Good ADL Goals Pt Will Perform Upper Body Bathing: with supervision;sitting Pt Will Perform Lower Body Bathing: with supervision;with adaptive equipment;sit to/from stand Pt Will Perform Upper Body Dressing: with supervision;sitting Pt Will Perform Lower Body Dressing: with supervision;with adaptive equipment;sit to/from stand Pt Will Transfer to Toilet:  with supervision;ambulating Pt Will Perform Toileting - Clothing Manipulation and hygiene: with supervision;sit to/from stand Pt Will Perform Tub/Shower Transfer: with supervision;ambulating;3 in 1 Pt/caregiver will Perform Home Exercise Program: Left upper extremity;Independently;With written HEP provided  Plan Discharge plan remains appropriate    Co-evaluation                 End of Session Equipment Utilized During Treatment: Gait belt;Other (comment)   Activity Tolerance Patient tolerated treatment well;Patient limited by pain   Patient Left in chair;with family/visitor present;with call bell/phone within reach   Nurse Communication      Functional Assessment Tool Used: clinical judgement Functional Limitation: Self care Self Care Current Status (L8921): At least 20 percent but less than 40 percent impaired, limited or restricted Self Care Goal Status (J9417): At least 1 percent but less than 20 percent impaired, limited or restricted   Time: 1513-1540 OT Time Calculation (min): 27 min  Charges: OT G-codes **NOT FOR INPATIENT CLASS** Functional Assessment Tool Used: clinical judgement Functional Limitation: Self care Self Care Current Status (E0814): At least 20 percent but less than 40 percent impaired, limited or restricted Self Care Goal Status (G8185): At least 1 percent but less than 20 percent impaired, limited or restricted OT General Charges $OT Visit: 1 Procedure OT Treatments $Therapeutic Exercise: 23-37 mins  Hortencia Pilar 06/10/2013, 5:02 PM

## 2013-06-11 ENCOUNTER — Encounter (HOSPITAL_COMMUNITY): Payer: Self-pay | Admitting: Orthopedic Surgery

## 2013-06-11 LAB — CBC
HEMATOCRIT: 31.2 % — AB (ref 36.0–46.0)
HEMOGLOBIN: 10.5 g/dL — AB (ref 12.0–15.0)
MCH: 30.9 pg (ref 26.0–34.0)
MCHC: 33.7 g/dL (ref 30.0–36.0)
MCV: 91.8 fL (ref 78.0–100.0)
Platelets: 308 10*3/uL (ref 150–400)
RBC: 3.4 MIL/uL — AB (ref 3.87–5.11)
RDW: 13.2 % (ref 11.5–15.5)
WBC: 8.1 10*3/uL (ref 4.0–10.5)

## 2013-06-11 LAB — GLUCOSE, CAPILLARY: GLUCOSE-CAPILLARY: 136 mg/dL — AB (ref 70–99)

## 2013-06-11 LAB — COMPREHENSIVE METABOLIC PANEL
ALT: 9 U/L (ref 0–35)
AST: 16 U/L (ref 0–37)
Albumin: 3 g/dL — ABNORMAL LOW (ref 3.5–5.2)
Alkaline Phosphatase: 71 U/L (ref 39–117)
BUN: 12 mg/dL (ref 6–23)
CALCIUM: 9.4 mg/dL (ref 8.4–10.5)
CO2: 28 meq/L (ref 19–32)
Chloride: 96 mEq/L (ref 96–112)
Creatinine, Ser: 0.59 mg/dL (ref 0.50–1.10)
GFR calc Af Amer: >90 mL/min (ref >90)
GFR calc non Af Amer: >90 mL/min (ref >90)
Glucose, Bld: 139 mg/dL — ABNORMAL HIGH (ref 70–99)
POTASSIUM: 3.6 meq/L — AB (ref 3.7–5.3)
SODIUM: 136 meq/L — AB (ref 137–147)
Total Bilirubin: 0.6 mg/dL (ref 0.3–1.2)
Total Protein: 6.2 g/dL (ref 6.0–8.3)

## 2013-06-11 LAB — PREALBUMIN: Prealbumin: 13.4 mg/dL — ABNORMAL LOW (ref 17.0–34.0)

## 2013-06-11 LAB — HEMOGLOBIN A1C
Hgb A1c MFr Bld: 6.4 % — ABNORMAL HIGH (ref <5.7)
Mean Plasma Glucose: 137 mg/dL — ABNORMAL HIGH (ref <117)

## 2013-06-11 MED ORDER — ACETAMINOPHEN 500 MG PO TABS: 1000.0000 mg | ORAL_TABLET | Freq: Three times a day (TID) | ORAL | Status: DC

## 2013-06-11 MED ORDER — TRAMADOL HCL 50 MG PO TABS: 50.0000 mg | ORAL_TABLET | Freq: Four times a day (QID) | ORAL | Status: DC | PRN

## 2013-06-11 MED ORDER — DSS 100 MG PO CAPS: 100.0000 mg | ORAL_CAPSULE | Freq: Two times a day (BID) | ORAL | Status: DC

## 2013-06-11 NOTE — Progress Notes (Signed)
[facility] Progress Note  Subjective  Doing better this am Trying to have BM   Tolerating diet No new issues   Worked with OT yesterday, recommended PT eval due to deficit in standing balance  nsg reports that oxy IR may be a little too sedating for pt   Pt states that she is ready to go home    Objective   BP 126/72  Pulse 75  Temp(Src) 98.5 F (36.9 C) (Oral)  Resp 18  Ht 5\' 3"  (1.6 m)  Wt 83.008 kg (183 lb)  BMI 32.43 kg/m2  SpO2 94%  Intake/Output     05/27 0701 - 05/28 0700 05/28 0701 - 05/29 0700   P.O. 1080    I.V. (mL/kg) 100 (1.2)    Total Intake(mL/kg) 1180 (14.2)    Urine (mL/kg/hr)     Blood     Total Output       Net +1180          Urine Occurrence 5 x      Labs Results for ENVI, EAGLESON (MRN [id number]) as of 06/11/2013 08:49  Ref. Range 06/11/2013 06:15  Sodium Latest Range: 137-147 mEq/L 136 (L)  Potassium Latest Range: 3.7-5.3 mEq/L 3.6 (L)  Chloride Latest Range: 96-112 mEq/L 96  CO2 Latest Range: 19-32 mEq/L 28  BUN Latest Range: 6-23 mg/dL 12  Creatinine Latest Range: 0.50-1.10 mg/dL 0.59  Calcium Latest Range: 8.4-10.5 mg/dL 9.4  GFR calc non Af Amer Latest Range: >90 mL/min >90  GFR calc Af Amer Latest Range: >90 mL/min >90  Glucose Latest Range: 70-99 mg/dL 139 (H)  Alkaline Phosphatase Latest Range: 39-117 U/L 71  Albumin Latest Range: 3.5-5.2 g/dL 3.0 (L)  AST Latest Range: 0-37 U/L 16  ALT Latest Range: 0-35 U/L 9  Total Protein Latest Range: 6.0-8.3 g/dL 6.2  Total Bilirubin Latest Range: 0.3-1.2 mg/dL 0.6  WBC Latest Range: 4.0-10.5 K/uL 8.1  RBC Latest Range: 3.87-5.11 MIL/uL 3.40 (L)  Hemoglobin Latest Range: 12.0-15.0 g/dL 10.5 (L)  HCT Latest Range: 36.0-46.0 % 31.2 (L)  MCV Latest Range: 78.0-100.0 fL 91.8  MCH Latest Range: 26.0-34.0 pg 30.9  MCHC Latest Range: 30.0-36.0 g/dL 33.7  RDW Latest Range: 11.5-15.5 % 13.2  Platelets Latest Range: 150-400 K/uL 308   CBG (last 3)   Recent Labs  06/10/13 1652 06/10/13 2144 06/11/13 0634  GLUCAP 137* 138* 136*       Exam  Gen: awake and alert, NAD  Lungs: clear anterior fields Cardiac: RRR, s1 and s2 Abd: + BS, NT Ext:       Left Upper Extremity               Dressing c/d/i             Ice pack in place             Sling fitting well             R/U/M motor and sensory functions intact             Ax sensation intact             Swelling well controlled             Ext warm             + radial pulse             Good perfusion distally      Assessment and Plan   POD/HD#: 2  POD/HD#: 1  68 y/o female s/p ORIF L proximal humeurs fracture   1. L proximal humerus fracture s/p ORIF               NWB L upper extremity               Shoulder pendulums only             Ok for AROM L elbow, forearm, wrist and hand             No lifting with L arm             Ice prn             Sling             OT eval             Dressing changes PRN  Ok to remove ace wrap  HHPT/OT consults   2. Pain management:          Dc oxy IR/Percocet  Tylenol 1000 mg q8h scheduled  Tramadol 50-100mg  po q6h prn severe pain   3. ABL anemia/Hemodynamics             stable  bp stable off bp meds    4. Medical issues                            Hypokalemia- improved                          Believe this to be multifactorial                                     Pt has had hypokalemia in the past                                     Pt received novolog around the time of lab draw, unclear if this was immediately before                                     Pt also on HCTZ which can cause hypokalemia                                     Dc novolog, dc HCTZ                                         This am potassium level much improved    Continue to hold HCTZ at dc    Recommend f/u with PCP to review meds    Per pt/family report she has had several instances of hypokalemia in recent years              HTN                          restart lisinopril at dc    DM   Home meds   5. DVT/PE prophylaxis:  SCDs             lovenox  No pharmacologics at dc   6. ID:               Completed perioop abx  7. Metabolic Bone Disease:             Pt with hx of osteopenia             Check vitamin D levels- labs pending   8. Activity:             PT/OT   As per #1  9. FEN/Foley/Lines:             diet as tolerated              Dc foley             dc IV              10. Impediments to fracture healing:             Osteopenia             DM  Fall risk   11. Dispo:            stable for dc home today  [facility] consults  Follow up with ortho in 7-10 days  Follow up with PCP in 7 days     [name], PA-C [facility] [phone] (P) 06/11/2013 8:46 AM  **Disclaimer: This note may have been dictated with voice recognition software. Similar sounding words can inadvertently be transcribed and this note may contain transcription errors which may not have been corrected upon publication of note.**

## 2013-06-11 NOTE — Progress Notes (Signed)
CARE MANAGEMENT NOTE 06/11/2013  Patient:  Amy Fry, Amy Fry   Account Number:  [id number]  Date Initiated:  06/11/2013  Documentation initiated by:  [facility]  Subjective/Objective Assessment:   admitted with left humeryus fracture, s/p left humerus ORIF     Action/Plan:   PT/OT homecare   Anticipated DC Date:  06/11/2013   Anticipated DC Plan:  [place]  CM consult      Choice offered to / List presented to:  C-1 Patient        [place] arranged  [place].   Status of service:  Completed, signed off Medicare Important Message given?  NA - LOS <3 / Initial given by admissions (If response is "NO", the following Medicare IM given date fields will be blank) Date Medicare IM given:   Date Additional Medicare IM given:    Discharge Disposition:  [place]  Per UR Regulation:    If discussed at Long Length of Stay Meetings, dates discussed:    Comments:  06/11/13 Spoke with patient and her husband about HHC, they selected [facility]. Contacted Donna at [place] and set up [place] and [place]. Fuller Plan RN, BSN, CCM

## 2013-06-11 NOTE — Progress Notes (Signed)
Agree with note. 06/11/2013 Mavis Gravelle, OTR/L Pager: [phone] 

## 2013-06-11 NOTE — Discharge Summary (Signed)
[facility] (OTS)  Patient ID: Amy Fry MRN: [id number] DOB/AGE: [birth year] 68 y.o.  Admit date: 06/09/2013 Discharge date: 06/11/2013  Admission Diagnoses: Closed fracture L proximal humerus DM Osteopenia HTN  Discharge Diagnoses:  Principal Problem:   Fracture of humerus, proximal, left, closed Active Problems:   DIABETES MELLITUS, TYPE II   HYPERTENSION   OSTEOPENIA   Hypokalemia   Procedures Performed:  06/09/2013- Dr. Carola Frost  1. Open reduction and internal fixation of left proximal humerus.   Discharged Condition: good  Hospital Course:   68 year old female admitted on 06/09/2013 for ORIF of comminuted left proximal humerus fracture. Patient has been seen in the office several times for followup. Patient tolerated surgery very well and underwent the procedure described above. After surgery she was transferred to the PACU and admitted to the orthopedic floor for observation, pain control and to begin therapies. On postoperative day #1 the patient was feeling poorly. She was having moderate pain. No vomiting or abdominal pain or noted. She was also noted to have a potassium level of 2.9. This was confirmed on repeat. Magnesium levels were normal, repeat EKG did not demonstrate any acute changes from her preoperative EKG. As such her hydrochlorothiazide was discontinued as was the sliding scale insulin. And patient was given oral potassium for supplementation. There is some question as to whether or not there is some insulin given around the time of the blood drawn if this may also contributed to her low potassium levels. The patient began to work with occupational therapy on postoperative day #1. She did fairly well however there were some balance deficits noted and a physical therapy consult was requested. This was obtained. On postoperative day #2 patient was doing much better. She was a little oversedated with the oxycodone as such her pain medicine  was changed to schedule Tylenol as well as Ultram as needed for breakthrough pain. On postoperative day #2 patient was deemed to be stable for discharge. Her potassium levels were back to preoperative levels. And she was deemed stable for discharge to home. Discharge instructions and plans were reviewed with the patient and husband and they were in agreement with plans. At the time of discharge we did continue to hold her hydrochlorothiazide but did restart her lisinopril. Home health consults were obtained to the patient prior to discharge as well.  Consults: None  Significant Diagnostic Studies: labs:    Results for Amy Fry, Amy Fry (MRN [id number]) as of 06/11/2013 08:49  Ref. Range 06/11/2013 06:15  Sodium Latest Range: 137-147 mEq/L 136 (L)  Potassium Latest Range: 3.7-5.3 mEq/L 3.6 (L)  Chloride Latest Range: 96-112 mEq/L 96  CO2 Latest Range: 19-32 mEq/L 28  BUN Latest Range: 6-23 mg/dL 12  Creatinine Latest Range: 0.50-1.10 mg/dL 9.98  Calcium Latest Range: 8.4-10.5 mg/dL 9.4  GFR calc non Af Amer Latest Range: >90 mL/min >90  GFR calc Af Amer Latest Range: >90 mL/min >90  Glucose Latest Range: 70-99 mg/dL 338 (H)  Alkaline Phosphatase Latest Range: 39-117 U/L 71  Albumin Latest Range: 3.5-5.2 g/dL 3.0 (L)  AST Latest Range: 0-37 U/L 16  ALT Latest Range: 0-35 U/L 9  Total Protein Latest Range: 6.0-8.3 g/dL 6.2  Total Bilirubin Latest Range: 0.3-1.2 mg/dL 0.6  WBC Latest Range: 4.0-10.5 K/uL 8.1  RBC Latest Range: 3.87-5.11 MIL/uL 3.40 (L)  Hemoglobin Latest Range: 12.0-15.0 g/dL 25.0 (L)  HCT Latest Range: 36.0-46.0 % 31.2 (L)  MCV Latest Range: 78.0-100.0 fL 91.8  MCH Latest  Range: 26.0-34.0 pg 30.9  MCHC Latest Range: 30.0-36.0 g/dL 33.7  RDW Latest Range: 11.5-15.5 % 13.2  Platelets Latest Range: 150-400 K/uL 308   EKG- 06/10/2013  NSR, no appreciable change from pre-op study    Treatments: IV hydration, antibiotics: Ancef, analgesia: acetaminophen and percocet, oxy IR,  anticoagulation: LMW heparin, insulin: novolog SSI, therapies: PT, OT and RN and surgery: as above   Discharge Exam:  [facility] Progress Note  Subjective  Doing better this am Trying to have BM   Tolerating diet No new issues   Worked with OT yesterday, recommended PT eval due to deficit in standing balance  nsg reports that oxy IR may be a little too sedating for pt   Pt states that she is ready to go home    Objective   BP 126/72  Pulse 75  Temp(Src) 98.5 F (36.9 C) (Oral)  Resp 18  Ht 5\' 3"  (1.6 m)  Wt 83.008 kg (183 lb)  BMI 32.43 kg/m2  SpO2 94%  Intake/Output     05/27 0701 - 05/28 0700 05/28 0701 - 05/29 0700    P.O. 1080     I.V. (mL/kg) 100 (1.2)     Total Intake(mL/kg) 1180 (14.2)     Urine (mL/kg/hr)      Blood      Total Output        Net +1180            Urine Occurrence 5 x       Labs Results for Amy Fry, Amy Fry (MRN [id number]) as of 06/11/2013 08:49   Ref. Range  06/11/2013 06:15   Sodium  Latest Range: 137-147 mEq/L  136 (L)   Potassium  Latest Range: 3.7-5.3 mEq/L  3.6 (L)   Chloride  Latest Range: 96-112 mEq/L  96   CO2  Latest Range: 19-32 mEq/L  28   BUN  Latest Range: 6-23 mg/dL  12   Creatinine  Latest Range: 0.50-1.10 mg/dL  0.59   Calcium  Latest Range: 8.4-10.5 mg/dL  9.4   GFR calc non Af Amer  Latest Range: >90 mL/min  >90   GFR calc Af Amer  Latest Range: >90 mL/min  >90   Glucose  Latest Range: 70-99 mg/dL  139 (H)   Alkaline Phosphatase  Latest Range: 39-117 U/L  71   Albumin  Latest Range: 3.5-5.2 g/dL  3.0 (L)   AST  Latest Range: 0-37 U/L  16   ALT  Latest Range: 0-35 U/L  9   Total Protein  Latest Range: 6.0-8.3 g/dL  6.2   Total Bilirubin  Latest Range: 0.3-1.2 mg/dL  0.6   WBC  Latest Range: 4.0-10.5 K/uL  8.1   RBC  Latest Range: 3.87-5.11 MIL/uL  3.40 (L)   Hemoglobin  Latest Range: 12.0-15.0 g/dL  10.5 (L)   HCT  Latest Range: 36.0-46.0 %  31.2 (L)   MCV  Latest Range: 78.0-100.0 fL  91.8    MCH  Latest Range: 26.0-34.0 pg  30.9   MCHC  Latest Range: 30.0-36.0 g/dL  33.7   RDW  Latest Range: 11.5-15.5 %  13.2   Platelets  Latest Range: 150-400 K/uL  308    CBG (last 3)   Recent Labs   06/10/13 1652  06/10/13 2144  06/11/13 0634   GLUCAP  137*  138*  136*        Exam  Gen: awake and alert, NAD   Lungs: clear anterior fields Cardiac: RRR, s1  and s2 Abd: + BS, NT Ext:        Left Upper Extremity               Dressing c/d/i             Ice pack in place             Sling fitting well             R/U/M motor and sensory functions intact             Ax sensation intact             Swelling well controlled             Ext warm             + radial pulse             Good perfusion distally      Assessment and Plan   POD/HD#: 2    68 y/o female s/p ORIF L proximal humeurs fracture   1. L proximal humerus fracture s/p ORIF               NWB L upper extremity               Shoulder pendulums only             Ok for AROM L elbow, forearm, wrist and hand             No lifting with L arm             Ice prn             Sling             OT eval             Dressing changes PRN             Ok to remove ace wrap             HHPT/OT consults   2. Pain management:             Dc oxy IR/Percocet             Tylenol 1000 mg q8h scheduled             Tramadol 50-100mg  po q6h prn severe pain   3. ABL anemia/Hemodynamics             stable             bp stable off bp meds    4. Medical issues                            Hypokalemia- improved                           Believe this to be multifactorial                                     Pt has had hypokalemia in the past                                     Pt received novolog around the time of lab draw, unclear if this was immediately before  Pt also on HCTZ which can cause hypokalemia                                     Dc novolog, dc HCTZ                                                                           This am potassium level much improved                                     Continue to hold HCTZ at dc                                     Recommend f/u with PCP to review meds                                     Per pt/family report she has had several instances of hypokalemia in recent years               HTN                         restart lisinopril at dc               DM                         Home meds   5. DVT/PE prophylaxis:             SCDs             lovenox             No pharmacologics at dc   6. ID:               Completed perioop abx  7. Metabolic Bone Disease:             Pt with hx of osteopenia             Check vitamin D levels- labs pending   8. Activity:             PT/OT               As per #1  9. FEN/Foley/Lines:             diet as tolerated               Dc foley             dc IV              10. Impediments to fracture healing:             Osteopenia             DM             Fall risk   11. Dispo:  stable for dc home today             [facility] consults             Follow up with ortho in 7-10 days             Follow up with PCP in 7 days      [name], PA-C [facility] [phone] (P) 06/11/2013 8:46 AM   Disposition: 01-Home or Self Care  Discharge Instructions   Call MD / Call 911    Complete by:  As directed   If you experience chest pain or shortness of breath, CALL 911 and be transported to the hospital emergency room.  If you develope a fever above 101 F, pus (white drainage) or increased drainage or redness at the wound, or calf pain, call your surgeon's office.     Constipation Prevention    Complete by:  As directed   Drink plenty of fluids.  Prune juice may be helpful.  You may use a stool softener, such as Colace (over the counter) 100 mg twice a day.  Use MiraLax (over the counter) for constipation as needed.     Diet Carb Modified     Complete by:  As directed      Discharge instructions    Complete by:  As directed   [facility] Discharge Instructions   General Discharge Instructions  WEIGHT BEARING STATUS: Nonweightbearing Left arm  RANGE OF MOTION/ACTIVITY: L shoulder pendulums only, ok to move elbow, forearm, wrist and hand. Sling at all times for now except when doing range of motion exercises. No pulling or pushing with Left arm   Wound Care: ok to remove dressing in 4 days. Can wash with soap and water. No ointments or solutions need to be put on wound. Ok to leave open to air once drainage has stopped   Diet: as you were eating previously.  Can use over the counter stool softeners and bowel preparations, such as Miralax, to help with bowel movements.  Narcotics can be constipating.  Be sure to drink plenty of fluids  STOP SMOKING OR USING NICOTINE PRODUCTS!!!!  As discussed nicotine severely impairs your body's ability to heal surgical and traumatic wounds but also impairs bone healing.  Wounds and bone heal by forming microscopic blood vessels (angiogenesis) and nicotine is a vasoconstrictor (essentially, shrinks blood vessels).  Therefore, if vasoconstriction occurs to these microscopic blood vessels they essentially disappear and are unable to deliver necessary nutrients to the healing tissue.  This is one modifiable factor that you can do to dramatically increase your chances of healing your injury.    (This means no smoking, no nicotine gum, patches, etc)  DO NOT USE NONSTEROIDAL ANTI-INFLAMMATORY DRUGS (NSAID'S)  Using products such as Advil (ibuprofen), Aleve (naproxen), Motrin (ibuprofen) for additional pain control during fracture healing can delay and/or prevent the healing response.  If you would like to take over the counter (OTC) medication, Tylenol (acetaminophen) is ok.  However, some narcotic medications that are given for pain control contain acetaminophen as well. Therefore, you should  not exceed more than 4000 mg of tylenol in a day if you do not have liver disease.  Also note that there are may OTC medicines, such as cold medicines and allergy medicines that my contain tylenol as well.  If you have any questions about medications and/or interactions please ask your doctor/PA or your pharmacist.   PAIN MEDICATION USE AND EXPECTATIONS  You  have likely been given narcotic medications to help control your pain.  After a traumatic event that results in an fracture (broken bone) with or without surgery, it is ok to use narcotic pain medications to help control one's pain.  We understand that everyone responds to pain differently and each individual patient will be evaluated on a regular basis for the continued need for narcotic medications. Ideally, narcotic medication use should last no more than 6-8 weeks (coinciding with fracture healing).   As a patient it is your responsibility as well to monitor narcotic medication use and report the amount and frequency you use these medications when you come to your office visit.   We would also advise that if you are using narcotic medications, you should take a dose prior to therapy to maximize you participation.  IF YOU ARE ON NARCOTIC MEDICATIONS IT IS NOT PERMISSIBLE TO OPERATE A MOTOR VEHICLE (MOTORCYCLE/CAR/TRUCK/MOPED) OR HEAVY MACHINERY DO NOT MIX NARCOTICS WITH OTHER CNS (CENTRAL NERVOUS SYSTEM) DEPRESSANTS SUCH AS ALCOHOL       ICE AND ELEVATE INJURED/OPERATIVE EXTREMITY  Using ice and elevating the injured extremity above your heart can help with swelling and pain control.  Icing in a pulsatile fashion, such as 20 minutes on and 20 minutes off, can be followed.    Do not place ice directly on skin. Make sure there is a barrier between to skin and the ice pack.    Using frozen items such as frozen peas works well as the conform nicely to the are that needs to be iced.  USE AN ACE WRAP OR TED HOSE FOR SWELLING CONTROL  In addition  to icing and elevation, Ace wraps or TED hose are used to help limit and resolve swelling.  It is recommended to use Ace wraps or TED hose until you are informed to stop.    When using Ace Wraps start the wrapping distally (farthest away from the body) and wrap proximally (closer to the body)   Example: If you had surgery on your leg or thing and you do not have a splint on, start the ace wrap at the toes and work your way up to the thigh        If you had surgery on your upper extremity and do not have a splint on, start the ace wrap at your fingers and work your way up to the upper arm  IF YOU ARE IN A SPLINT OR CAST DO NOT [place]   If your splint gets wet for any reason please contact the office immediately. You may shower in your splint or cast as long as you keep it dry.  This can be done by wrapping in a cast cover or garbage back (or similar)  Do Not stick any thing down your splint or cast such as pencils, money, or hangers to try and scratch yourself with.  If you feel itchy take benadryl as prescribed on the bottle for itching  IF YOU ARE IN A CAM BOOT (BLACK BOOT)  You may remove boot periodically. Perform daily dressing changes as noted below.  Wash the liner of the boot regularly and wear a sock when wearing the boot. It is recommended that you sleep in the boot until told otherwise  CALL THE OFFICE WITH ANY QUESTIONS OR CONCERTS: [phone]     Discharge Wound Care Instructions  Do NOT apply any ointments, solutions or lotions to pin sites or surgical wounds.  These prevent needed  drainage and even though solutions like hydrogen peroxide kill bacteria, they also damage cells lining the pin sites that help fight infection.  Applying lotions or ointments can keep the wounds moist and can cause them to breakdown and open up as well. This can increase the risk for infection. When in doubt call the office.  Surgical incisions should be dressed daily.  If any  drainage is noted, use one layer of adaptic, then gauze, Kerlix, and an ace wrap.  Once the incision is completely dry and without drainage, it may be left open to air out.  Showering may begin 36-48 hours later.  Cleaning gently with soap and water.  Traumatic wounds should be dressed daily as well.    One layer of adaptic, gauze, Kerlix, then ace wrap.  The adaptic can be discontinued once the draining has ceased    If you have a wet to dry dressing: wet the gauze with saline the squeeze as much saline out so the gauze is moist (not soaking wet), place moistened gauze over wound, then place a dry gauze over the moist one, followed by Kerlix wrap, then ace wrap.  CALL OFFICE WITH QUESTIONS OR CONCERNS [phone]     Driving restrictions    Complete by:  As directed   No driving     Increase activity slowly as tolerated    Complete by:  As directed      Lifting restrictions    Complete by:  As directed   No lifting     Non weight bearing    Complete by:  As directed   Laterality:  left  Extremity:  Upper            Medication List    STOP taking these medications       hydrochlorothiazide 25 MG tablet  Commonly known as:  HYDRODIURIL     oxyCODONE-acetaminophen 5-325 MG per tablet  Commonly known as:  PERCOCET/ROXICET      TAKE these medications       acetaminophen 500 MG tablet  Commonly known as:  TYLENOL  Take 2 tablets (1,000 mg total) by mouth every 8 (eight) hours.     aspirin 81 MG EC tablet  Take 81 mg by mouth daily.     Biotin 5000 MCG Tabs  Take 5,000 mcg by mouth daily.     CALCIUM 1200 PO  Take 1 tablet by mouth 2 (two) times daily.     CRANBERRY PO  Take 4,200 Units by mouth daily.     DSS 100 MG Caps  Take 100 mg by mouth 2 (two) times daily.     gemfibrozil 600 MG tablet  Commonly known as:  LOPID  Take 1 tablet (600 mg total) by mouth 2 (two) times daily.     lisinopril 5 MG tablet  Commonly known as:  PRINIVIL,ZESTRIL  Take 1 tablet  (5 mg total) by mouth daily.     lovastatin 20 MG tablet  Commonly known as:  MEVACOR  Take 1 tablet (20 mg total) by mouth daily.     metFORMIN 500 MG tablet  Commonly known as:  GLUCOPHAGE  Take 1 tablet (500 mg total) by mouth 2 (two) times daily with a meal.     Omega-3 300 MG Caps  Take 300 mg by mouth 2 (two) times daily.     traMADol 50 MG tablet  Commonly known as:  ULTRAM  Take 1-2 tablets (50-100 mg total) by mouth every 6 (six)  hours as needed for moderate pain or severe pain.     vitamin B-12 1000 MCG tablet  Commonly known as:  CYANOCOBALAMIN  Take 1,000 mcg by mouth daily.     vitamin C 1000 MG tablet  Take 500 mg by mouth daily.     Vitamin D 2000 UNITS Caps  Take 2,000 Units by mouth daily.           Follow-up Information   Follow up with HANDY,MICHAEL H, MD. Schedule an appointment as soon as possible for a visit in 10 days. (For wound re-check, For suture removal)    Specialty:  Orthopedic Surgery   Contact information:   [address] [place] [address] [phone]       Follow up with Loura Pardon, MD. Schedule an appointment as soon as possible for a visit in 1 week. (medication review )    Specialties:  Family Medicine, Radiology   Contact information:   [address] [place] [address] [phone]       Discharge Instructions and Plan:  Pt has sustained a severe injury to her Left upper extremity.  We were able to achieve a technically successful ORIF of the Left proximal humerus.   The pt does have poor bone quality which will hinder the healing process but it was felt that ORIF would give her the best chance at full recovery and maximize ROM recovery  Pt will be NWB on L arm for 8 weeks Gentle pendulums and PROM of L shoulder only. NO AGGRESSIVE IR or ER of L shoulder. NO ACTIVE motion to L shoulder AROM L elbow, forearm, wrist and hand Dressing changes as needed, see wound care  instructions for details Ice prn  Sling for comfort and when up out of bed Ace wrap to L arm, remove daily to allow skin to breathe  Follow up in 10-14 days, call for appointment Xrays and removal of sutures at that time Will initiate AAROM of the L shoulder around the 4-6 week mark, AROM around the 6-8 week mark and resistance activities for the L shoulder around the 8 week mark Please call the office with questions.   Signed:  [name], PA-C [facility] [phone] (P) 06/11/2013, 9:04 AM  **Disclaimer: This note may have been dictated with voice recognition software. Similar sounding words can inadvertently be transcribed and this note may contain transcription errors which may not have been corrected upon publication of note.**

## 2013-06-11 NOTE — Progress Notes (Signed)
I have reviewed and discussed in detail with Amy Fry the patient's presentation, examination findings, and I formulated the plan outlined above.  [name], MD [facility] [phone] (p)   

## 2013-06-11 NOTE — Discharge Instructions (Signed)
Orthopaedic Trauma Service Discharge Instructions   General Discharge Instructions  WEIGHT BEARING STATUS: Nonweightbearing Left arm  RANGE OF MOTION/ACTIVITY: L shoulder pendulums only, ok to move elbow, forearm, wrist and hand. Sling at all times for now except when doing range of motion exercises. No pulling or pushing with Left arm   Wound Care: ok to remove dressing in 4 days. Can wash with soap and water. No ointments or solutions need to be put on wound. Ok to leave open to air once drainage has stopped   Diet: as you were eating previously.  Can use over the counter stool softeners and bowel preparations, such as Miralax, to help with bowel movements.  Narcotics can be constipating.  Be sure to drink plenty of fluids  STOP SMOKING OR USING NICOTINE PRODUCTS!!!!  As discussed nicotine severely impairs your body's ability to heal surgical and traumatic wounds but also impairs bone healing.  Wounds and bone heal by forming microscopic blood vessels (angiogenesis) and nicotine is a vasoconstrictor (essentially, shrinks blood vessels).  Therefore, if vasoconstriction occurs to these microscopic blood vessels they essentially disappear and are unable to deliver necessary nutrients to the healing tissue.  This is one modifiable factor that you can do to dramatically increase your chances of healing your injury.    (This means no smoking, no nicotine gum, patches, etc)  DO NOT USE NONSTEROIDAL ANTI-INFLAMMATORY DRUGS (NSAID'S)  Using products such as Advil (ibuprofen), Aleve (naproxen), Motrin (ibuprofen) for additional pain control during fracture healing can delay and/or prevent the healing response.  If you would like to take over the counter (OTC) medication, Tylenol (acetaminophen) is ok.  However, some narcotic medications that are given for pain control contain acetaminophen as well. Therefore, you should not exceed more than 4000 mg of tylenol in a day if you do not have liver disease.   Also note that there are may OTC medicines, such as cold medicines and allergy medicines that my contain tylenol as well.  If you have any questions about medications and/or interactions please ask your doctor/PA or your pharmacist.   PAIN MEDICATION USE AND EXPECTATIONS  You have likely been given narcotic medications to help control your pain.  After a traumatic event that results in an fracture (broken bone) with or without surgery, it is ok to use narcotic pain medications to help control one's pain.  We understand that everyone responds to pain differently and each individual patient will be evaluated on a regular basis for the continued need for narcotic medications. Ideally, narcotic medication use should last no more than 6-8 weeks (coinciding with fracture healing).   As a patient it is your responsibility as well to monitor narcotic medication use and report the amount and frequency you use these medications when you come to your office visit.   We would also advise that if you are using narcotic medications, you should take a dose prior to therapy to maximize you participation.  IF YOU ARE ON NARCOTIC MEDICATIONS IT IS NOT PERMISSIBLE TO OPERATE A MOTOR VEHICLE (MOTORCYCLE/CAR/TRUCK/MOPED) OR HEAVY MACHINERY DO NOT MIX NARCOTICS WITH OTHER CNS (CENTRAL NERVOUS SYSTEM) DEPRESSANTS SUCH AS ALCOHOL       ICE AND ELEVATE INJURED/OPERATIVE EXTREMITY  Using ice and elevating the injured extremity above your heart can help with swelling and pain control.  Icing in a pulsatile fashion, such as 20 minutes on and 20 minutes off, can be followed.    Do not place ice directly on skin. Make sure there  is a barrier between to skin and the ice pack.    Using frozen items such as frozen peas works well as the conform nicely to the are that needs to be iced.  USE AN ACE WRAP OR TED HOSE FOR SWELLING CONTROL  In addition to icing and elevation, Ace wraps or TED hose are used to help limit and resolve  swelling.  It is recommended to use Ace wraps or TED hose until you are informed to stop.    When using Ace Wraps start the wrapping distally (farthest away from the body) and wrap proximally (closer to the body)   Example: If you had surgery on your leg or thing and you do not have a splint on, start the ace wrap at the toes and work your way up to the thigh        If you had surgery on your upper extremity and do not have a splint on, start the ace wrap at your fingers and work your way up to the upper arm  IF YOU ARE IN A SPLINT OR CAST DO NOT [place]   If your splint gets wet for any reason please contact the office immediately. You may shower in your splint or cast as long as you keep it dry.  This can be done by wrapping in a cast cover or garbage back (or similar)  Do Not stick any thing down your splint or cast such as pencils, money, or hangers to try and scratch yourself with.  If you feel itchy take benadryl as prescribed on the bottle for itching  IF YOU ARE IN A CAM BOOT (BLACK BOOT)  You may remove boot periodically. Perform daily dressing changes as noted below.  Wash the liner of the boot regularly and wear a sock when wearing the boot. It is recommended that you sleep in the boot until told otherwise  CALL THE OFFICE WITH ANY QUESTIONS OR CONCERTS: [phone]     Discharge Wound Care Instructions  Do NOT apply any ointments, solutions or lotions to pin sites or surgical wounds.  These prevent needed drainage and even though solutions like hydrogen peroxide kill bacteria, they also damage cells lining the pin sites that help fight infection.  Applying lotions or ointments can keep the wounds moist and can cause them to breakdown and open up as well. This can increase the risk for infection. When in doubt call the office.  Surgical incisions should be dressed daily.  If any drainage is noted, use one layer of adaptic, then gauze, Kerlix, and an ace  wrap.  Once the incision is completely dry and without drainage, it may be left open to air out.  Showering may begin 36-48 hours later.  Cleaning gently with soap and water.  Traumatic wounds should be dressed daily as well.    One layer of adaptic, gauze, Kerlix, then ace wrap.  The adaptic can be discontinued once the draining has ceased    If you have a wet to dry dressing: wet the gauze with saline the squeeze as much saline out so the gauze is moist (not soaking wet), place moistened gauze over wound, then place a dry gauze over the moist one, followed by Kerlix wrap, then ace wrap.  CALL OFFICE WITH QUESTIONS OR CONCERNS [phone]

## 2013-06-11 NOTE — Progress Notes (Signed)
Occupational Therapy Treatment Patient Details Name: Amy Fry MRN: [id number] DOB: 05/29/1945 Today's Date: 06/11/2013   History of present illness s/p open reduction and internal fixation of left proximal humerus   OT comments  Pt is progressing towards goals and was educated in and demonstrated proper bathing and dressing techniques and went through her pendulum, elbow, wrist and hand AROM exercises and the benefits of activity during the session. D/c plan and goals remain appropriate.   Follow Up Recommendations  Supervision/Assistance - 24 hour    Equipment Recommendations  3 in 1 bedside comode    Recommendations for Other Services      Precautions / Restrictions Precautions Precautions: Shoulder Type of Shoulder Precautions: NWB, No AROM of L shoulder Shoulder Interventions: Shoulder sling/immobilizer;Off for dressing/bathing/exercises Precaution Comments: provided shoulder protocol and reviewed Required Braces or Orthoses: Sling Restrictions Weight Bearing Restrictions: Yes LUE Weight Bearing: Non weight bearing       Mobility  Transfers Overall transfer level: Needs assistance   Transfers: Sit to/from Stand Sit to Stand: Min guard              Balance Overall balance assessment: Needs assistance   Sitting balance-Leahy Scale: Fair     Standing balance support: During functional activity Standing balance-Leahy Scale: Fair                     ADL Overall ADL's : Needs assistance/impaired     Grooming: Sitting;Applying deodorant;With caregiver independent assisting Grooming Details (indicate cue type and reason): unable to put on deodorant on RUE due to angle, no deodorant allowed on LUE Upper Body Bathing: Sitting;Minimal assitance Upper Body Bathing Details (indicate cue type and reason): able to do front with S, but min A for back Lower Body Bathing: Sit to/from stand;With adaptive equipment;Minimal assistance   Upper Body Dressing  : Minimal assistance;Sitting;Cueing for compensatory techniques   Lower Body Dressing: Minimal assistance;Sit to/from stand               Functional mobility during ADLs: Min guard General ADL Comments: Pt alert and ambulatory this session at min guard. In-room ambulation bathroom for bathing and dressing. Pt needs assistance for ADLs that RUE cannot do alone. Instructed pt in one handed techniques.                Cognition   Behavior During Therapy: WFL for tasks assessed/performed Overall Cognitive Status: Within Functional Limits for tasks assessed                         Exercises Shoulder Exercises Pendulum Exercise: PROM;10 reps;Left;Standing (circular, flexion/ext, side to side) Elbow Flexion: Left;10 reps;Standing;AROM Elbow Extension: Left;10 reps;Standing;AROM Wrist Flexion: 10 reps;Left;Seated;AROM Wrist Extension: AROM;Left;10 reps;Seated Digit Composite Flexion: AROM;Left;10 reps;Seated Composite Extension: AROM;Left;10 reps;Seated   Shoulder Instructions Shoulder Instructions Donning/doffing shirt without moving shoulder: Minimal assistance Method for sponge bathing under operated UE: Supervision/safety Donning/doffing sling/immobilizer: Minimal assistance Correct positioning of sling/immobilizer: Supervision/safety Pendulum exercises (written home exercise program): Min-guard ROM for elbow, wrist and digits of operated UE: Supervision/safety Sling wearing schedule (on at all times/off for ADL's): Supervision/safety Proper positioning of operated UE when showering: Supervision/safety Positioning of UE while sleeping: Supervision/safety          Pertinent Vitals/ Pain       No c/o pain.         Frequency Min 2X/week     Progress Toward Goals  OT Goals(current goals can now be  found in the care plan section)  Progress towards OT goals: Progressing toward goals  Acute Rehab OT Goals Patient Stated Goal: to go home OT Goal  Formulation: With patient/family Time For Goal Achievement: 06/17/13 Potential to Achieve Goals: Good  Plan Discharge plan remains appropriate          Activity Tolerance Patient tolerated treatment well;Patient limited by fatigue   Patient Left in chair;with family/visitor present;with call bell/phone within reach   Nurse Communication  (bathed, dressed and ready for d/c)        Time:  -     Charges:    Lyda Perone 06/11/2013, 10:14 AM

## 2013-06-12 ENCOUNTER — Telehealth: Payer: Self-pay

## 2013-06-12 LAB — VITAMIN D 25 HYDROXY (VIT D DEFICIENCY, FRACTURES): Vit D, 25-Hydroxy: 65 ng/mL (ref 30–89)

## 2013-06-12 LAB — PTH, INTACT AND CALCIUM
Calcium, Total (PTH): 8.9 mg/dL (ref 8.4–10.5)
PTH: 36.9 pg/mL (ref 14.0–72.0)

## 2013-06-12 NOTE — Telephone Encounter (Signed)
Glenetta Hew PT with [facility] left v/m; this is not an emergency; Dr Daun Peacock referred pt to [facility] for proximal humerus fx. Upon entering pts medication in to [place] profile it was noted that level 1 contraindicated med interaction and due to [facility] policy has to notify PCP;  The level 1 interaction is between Lovastatin with Gemfibrozil and pt has been taking these meds for 5 years or longer.Please advise.

## 2013-06-14 LAB — VITAMIN D 1,25 DIHYDROXY
Vitamin D 1, 25 (OH)2 Total: 29 pg/mL (ref 18–72)
Vitamin D3 1, 25 (OH)2: 29 pg/mL

## 2013-06-14 NOTE — Telephone Encounter (Signed)
Aware- and I want to continue those

## 2013-06-15 ENCOUNTER — Ambulatory Visit (INDEPENDENT_AMBULATORY_CARE_PROVIDER_SITE_OTHER): Payer: Commercial Managed Care - HMO | Admitting: Family Medicine

## 2013-06-15 ENCOUNTER — Encounter: Payer: Self-pay | Admitting: Family Medicine

## 2013-06-15 VITALS — BP 116/72 | HR 86 | Temp 98.0°F | Ht 63.75 in | Wt 184.5 lb

## 2013-06-15 DIAGNOSIS — R600 Localized edema: Secondary | ICD-10-CM

## 2013-06-15 DIAGNOSIS — I10 Essential (primary) hypertension: Secondary | ICD-10-CM

## 2013-06-15 DIAGNOSIS — E559 Vitamin D deficiency, unspecified: Secondary | ICD-10-CM

## 2013-06-15 DIAGNOSIS — D62 Acute posthemorrhagic anemia: Secondary | ICD-10-CM

## 2013-06-15 DIAGNOSIS — K5900 Constipation, unspecified: Secondary | ICD-10-CM | POA: Insufficient documentation

## 2013-06-15 DIAGNOSIS — E119 Type 2 diabetes mellitus without complications: Secondary | ICD-10-CM

## 2013-06-15 DIAGNOSIS — S42202A Unspecified fracture of upper end of left humerus, initial encounter for closed fracture: Secondary | ICD-10-CM

## 2013-06-15 DIAGNOSIS — E876 Hypokalemia: Secondary | ICD-10-CM

## 2013-06-15 MED ORDER — METFORMIN HCL 500 MG PO TABS: 500.0000 mg | ORAL_TABLET | Freq: Two times a day (BID) | ORAL | Status: DC

## 2013-06-15 MED ORDER — LISINOPRIL 5 MG PO TABS: 5.0000 mg | ORAL_TABLET | Freq: Every day | ORAL | Status: DC

## 2013-06-15 MED ORDER — SPIRONOLACTONE 25 MG PO TABS: 12.5000 mg | ORAL_TABLET | Freq: Every day | ORAL | Status: DC

## 2013-06-15 MED ORDER — LOVASTATIN 20 MG PO TABS: 20.0000 mg | ORAL_TABLET | Freq: Every day | ORAL | Status: DC

## 2013-06-15 MED ORDER — GEMFIBROZIL 600 MG PO TABS: 600.0000 mg | ORAL_TABLET | Freq: Two times a day (BID) | ORAL | Status: DC

## 2013-06-15 NOTE — Assessment & Plan Note (Signed)
S/p surgery Pt declines iron due to constipation Disc iron rich foods  Rev hosp notes in detail  Will re check cbc at upcoming f/u

## 2013-06-15 NOTE — Assessment & Plan Note (Signed)
D level in 60s in the hospital

## 2013-06-15 NOTE — Patient Instructions (Addendum)
Start the 1/2 tab of spironolactone for your ankle swelling Avoid salt and elevate your feet when you can  For constipation-continue the stool softener with lots of water and add miralax (over the counter)-once daily  Follow up with me in about 2 weeks (we will do visit and also labs that day - eat light but don't fast)- we will check cholesterol

## 2013-06-15 NOTE — Assessment & Plan Note (Signed)
bp in fair control at this time  BP Readings from Last 1 Encounters:  06/15/13 116/72   No changes needed Disc lifstyle change with low sodium diet and exercise  Labs reviewed  Adding spironolactone for edema and will watch bp and lytes

## 2013-06-15 NOTE — Progress Notes (Signed)
Subjective:    Patient ID: Amy Fry, female    DOB: Jun 05, 1945, 68 y.o.   MRN: [id number]  HPI Here for transitional care visit after hosp 5/26-5/28 for prox humerous fracture that required ORIF  Rev hosp notes/ studies and labs with pt today in detail   She did it when she tripped going up a walkway (tripped over her own feet) - and she fell on her shoulder (she denies balance problem-states she was carrying too many things) They put a splint on her at [facility] - and she was allergic to it- broke out in blisters and had to wait quite a while to have the surgery   Generally weak all over   Is doing PT at home  Working on that  She sees ortho next Monday   Constipation - taking tramadol (tries to take only when she needs it)  She takes stool softener every day -not helping  Took laxative-not too helpful    K was 2.9 in hosp  Supplemented   Chemistry      Component Value Date/Time   NA 136* 06/11/2013 0615   K 3.6* 06/11/2013 0615   CL 96 06/11/2013 0615   CO2 28 06/11/2013 0615   BUN 12 06/11/2013 0615   CREATININE 0.59 06/11/2013 0615      Component Value Date/Time   CALCIUM 8.9 06/11/2013 0615   CALCIUM 9.4 06/11/2013 0615   ALKPHOS 71 06/11/2013 0615   AST 16 06/11/2013 0615   ALT 9 06/11/2013 0615   BILITOT 0.6 06/11/2013 0615      Holding hctz due to low K  Continued ace Her pedal edema is back and intolerable She wants to try something for swelling  No sob or cp or cardiac symptoms     Cbc post op- blood loss anemia  Lab Results  Component Value Date   WBC 8.1 06/11/2013   HGB 10.5* 06/11/2013   HCT 31.2* 06/11/2013   MCV 91.8 06/11/2013   PLT 308 06/11/2013   she feels quite tired    Lab Results  Component Value Date   HGBA1C 6.4* 06/11/2013   sugar control is very good   Lab Results  Component Value Date   CHOL 158 06/03/2012   HDL 57.10 06/03/2012   LDLCALC 83 06/03/2012   TRIG 91.0 06/03/2012   CHOLHDL 3 06/03/2012   due for her chol labs     Patient Active Problem List   Diagnosis Date Noted  . Fracture of humerus, proximal, left, closed 06/09/2013  . Parotid gland fullness 08/19/2012  . Encounter for Medicare annual wellness exam 06/17/2012  . Hypokalemia 06/17/2012  . Routine general medical examination at a health care facility 02/18/2011  . UNSPECIFIED VITAMIN D DEFICIENCY 07/30/2008  . DERMATOPHYTOSIS, NAIL 07/26/2006  . DIABETES MELLITUS, TYPE II 07/25/2006  . HYPERCHOLESTEROLEMIA 07/25/2006  . HYPERTENSION 07/25/2006  . OSTEOPENIA 07/25/2006   Past Medical History  Diagnosis Date  . Diabetes mellitus     type II  . Hypertension   . Osteopenia   . Early cataracts, bilateral     no sx yet   . Hyperlipidemia   . Arthritis    Past Surgical History  Procedure Laterality Date  . Laparoscopic cholecystectomy    . Oophorectomy    . Cystoscopy  10-1999  . Fracture surgery  2010    rib fx- from fall   . Orif humerus fracture Left 06/09/2013    Procedure: OPEN REDUCTION INTERNAL FIXATION (ORIF) PROXIMAL HUMERUS  FRACTURE;  Surgeon: Rozanna Box, MD;  Location: [facility];  Service: Orthopedics;  Laterality: Left;   History  Substance Use Topics  . Smoking status: Never Smoker   . Smokeless tobacco: Never Used  . Alcohol Use: No   Family History  Problem Relation Age of Onset  . Heart attack Mother   . Depression Sister   . Heart attack Brother   . Stroke Brother   . Colon cancer Neg Hx   . Stomach cancer Neg Hx   . Esophageal cancer Neg Hx   . Rectal cancer Neg Hx    Allergies  Allergen Reactions  . Alendronate Sodium     REACTION: reaction not known  . Atorvastatin     REACTION: headaches  . Pravastatin Sodium     REACTION: headaches and arm pain   Current Outpatient Prescriptions on File Prior to Visit  Medication Sig Dispense Refill  . acetaminophen (TYLENOL) 500 MG tablet Take 2 tablets (1,000 mg total) by mouth every 8 (eight) hours.  90 tablet  0  . Ascorbic Acid (VITAMIN C) 1000 MG  tablet Take 500 mg by mouth daily.       Marland Kitchen aspirin 81 MG EC tablet Take 81 mg by mouth daily.        . Biotin 5000 MCG TABS Take 5,000 mcg by mouth daily.       . Calcium Carbonate-Vit D-Min (CALCIUM 1200 PO) Take 1 tablet by mouth 2 (two) times daily.      . Cholecalciferol (VITAMIN D) 2000 UNITS CAPS Take 2,000 Units by mouth daily.       Marland Kitchen CRANBERRY PO Take 4,200 Units by mouth daily.      Marland Kitchen docusate sodium 100 MG CAPS Take 100 mg by mouth 2 (two) times daily.  20 capsule  1  . gemfibrozil (LOPID) 600 MG tablet Take 1 tablet (600 mg total) by mouth 2 (two) times daily.  60 tablet  11  . lisinopril (PRINIVIL,ZESTRIL) 5 MG tablet Take 1 tablet (5 mg total) by mouth daily.  30 tablet  11  . lovastatin (MEVACOR) 20 MG tablet Take 1 tablet (20 mg total) by mouth daily.  30 tablet  11  . metFORMIN (GLUCOPHAGE) 500 MG tablet Take 1 tablet (500 mg total) by mouth 2 (two) times daily with a meal.  60 tablet  11  . Omega-3 300 MG CAPS Take 300 mg by mouth 2 (two) times daily.       . traMADol (ULTRAM) 50 MG tablet Take 1-2 tablets (50-100 mg total) by mouth every 6 (six) hours as needed for moderate pain or severe pain.  40 tablet  1  . vitamin B-12 (CYANOCOBALAMIN) 1000 MCG tablet Take 1,000 mcg by mouth daily.       No current facility-administered medications on file prior to visit.     Review of Systems Review of Systems  Constitutional: Negative for fever, appetite change, and unexpected weight change pos for fatigue.  Eyes: Negative for pain and visual disturbance.  Respiratory: Negative for cough and shortness of breath.   Cardiovascular: Negative for cp or palpitations neg for PND or orthopnea    Gastrointestinal: Negative for nausea, diarrhea and pos for constipation  Genitourinary: Negative for urgency and frequency.  Skin: Negative for pallor or rash   MSK pos for L arm pain s/p surgery Neurological: Negative for weakness, light-headedness, numbness and headaches.  Hematological:  Negative for adenopathy. Does not bruise/bleed easily.  Psychiatric/Behavioral: Negative for dysphoric  mood. The patient is not nervous/anxious.         Objective:   Physical Exam  Constitutional: She appears well-developed and well-nourished. No distress.  obese and well appearing   HENT:  Head: Normocephalic and atraumatic.  Mouth/Throat: Oropharynx is clear and moist.  Eyes: Conjunctivae and EOM are normal. Pupils are equal, round, and reactive to light. Right eye exhibits no discharge. Left eye exhibits no discharge. No scleral icterus.  Neck: Normal range of motion. Neck supple. No JVD present.  Cardiovascular: Normal rate, regular rhythm and intact distal pulses.  Exam reveals no gallop.   Pulmonary/Chest: Effort normal and breath sounds normal. No respiratory distress. She has no wheezes. She has no rales.  No crackles   Abdominal: Soft. Bowel sounds are normal. She exhibits no distension and no mass. There is no tenderness.  Musculoskeletal: She exhibits edema.  L arm in a sling  Did not examine arm  Trace pedal edema   Lymphadenopathy:    She has no cervical adenopathy.  Neurological: She is alert. She has normal reflexes. No cranial nerve deficit. She exhibits normal muscle tone. Coordination normal.  Skin: Skin is warm and dry. No rash noted. No erythema.  Psychiatric: She has a normal mood and affect.          Assessment & Plan:

## 2013-06-15 NOTE — Assessment & Plan Note (Signed)
Mild on exam but distressing to pt No cardiac s/s Will replace hctz (low K) with aldactone 12.5 qd -watching chemistries and bp closely F/u planned

## 2013-06-15 NOTE — Progress Notes (Signed)
Pre visit review using our clinic review tool, if applicable. No additional management support is needed unless otherwise documented below in the visit note. 

## 2013-06-15 NOTE — Telephone Encounter (Signed)
Amy Fry, advised that Dr. Glori Bickers wants to continue those two medications

## 2013-06-15 NOTE — Assessment & Plan Note (Signed)
Due to pain medication most likely  Will continue stool softener Add mirilax daily Disc fluids and fiber Update if not improving

## 2013-06-15 NOTE — Assessment & Plan Note (Signed)
Pt is doing fairly s/p surgery- worries her PT is too intense Has upcoming ortho f/u  Anemia worse after surg-pt would rather avoid iron due to constipation -will re check next time

## 2013-06-15 NOTE — Assessment & Plan Note (Signed)
This was corrected inpt and hctz stopped For edema will try spironolactone 12.5 and watch closely -as this can make K too high /esp with ace

## 2013-06-15 NOTE — Assessment & Plan Note (Signed)
Lab Results  Component Value Date   HGBA1C 6.4* 06/11/2013   from the hospital Continues good control

## 2013-06-16 ENCOUNTER — Telehealth: Payer: Self-pay | Admitting: Family Medicine

## 2013-06-16 NOTE — Telephone Encounter (Signed)
Relevant patient education mailed to patient.  

## 2013-06-19 ENCOUNTER — Encounter (HOSPITAL_COMMUNITY): Payer: Self-pay | Admitting: Orthopedic Surgery

## 2013-06-19 NOTE — OR Nursing (Signed)
Late entry on 06-19-2013 by D. Doshie Maggi, RN to add the procedure end time.  

## 2013-06-29 ENCOUNTER — Ambulatory Visit (INDEPENDENT_AMBULATORY_CARE_PROVIDER_SITE_OTHER): Payer: Commercial Managed Care - HMO | Admitting: Family Medicine

## 2013-06-29 ENCOUNTER — Encounter: Payer: Self-pay | Admitting: Family Medicine

## 2013-06-29 VITALS — BP 122/82 | HR 66 | Temp 98.0°F | Ht 63.75 in | Wt 176.2 lb

## 2013-06-29 DIAGNOSIS — D62 Acute posthemorrhagic anemia: Secondary | ICD-10-CM

## 2013-06-29 DIAGNOSIS — R600 Localized edema: Secondary | ICD-10-CM

## 2013-06-29 DIAGNOSIS — R609 Edema, unspecified: Secondary | ICD-10-CM

## 2013-06-29 DIAGNOSIS — I10 Essential (primary) hypertension: Secondary | ICD-10-CM

## 2013-06-29 DIAGNOSIS — E876 Hypokalemia: Secondary | ICD-10-CM

## 2013-06-29 LAB — COMPREHENSIVE METABOLIC PANEL
ALT: 12 U/L (ref 0–35)
AST: 16 U/L (ref 0–37)
Albumin: 4.1 g/dL (ref 3.5–5.2)
Alkaline Phosphatase: 109 U/L (ref 39–117)
BUN: 17 mg/dL (ref 6–23)
CHLORIDE: 103 meq/L (ref 96–112)
CO2: 26 meq/L (ref 19–32)
CREATININE: 0.9 mg/dL (ref 0.4–1.2)
Calcium: 9.8 mg/dL (ref 8.4–10.5)
GFR: 70.02 mL/min (ref >60.00)
GLUCOSE: 112 mg/dL — AB (ref 70–99)
Potassium: 4.2 mEq/L (ref 3.5–5.1)
Sodium: 138 mEq/L (ref 135–145)
Total Bilirubin: 0.6 mg/dL (ref 0.2–1.2)
Total Protein: 7.2 g/dL (ref 6.0–8.3)

## 2013-06-29 LAB — CBC WITH DIFFERENTIAL/PLATELET
Basophils Absolute: 0 10*3/uL (ref 0.0–0.1)
Basophils Relative: 0.8 % (ref 0.0–3.0)
EOS PCT: 2.9 % (ref 0.0–5.0)
Eosinophils Absolute: 0.1 10*3/uL (ref 0.0–0.7)
HEMATOCRIT: 35.2 % — AB (ref 36.0–46.0)
HEMOGLOBIN: 11.7 g/dL — AB (ref 12.0–15.0)
LYMPHS ABS: 1.6 10*3/uL (ref 0.7–4.0)
LYMPHS PCT: 43.9 % (ref 12.0–46.0)
MCHC: 33.3 g/dL (ref 30.0–36.0)
MCV: 94.5 fl (ref 78.0–100.0)
Monocytes Absolute: 0.4 10*3/uL (ref 0.1–1.0)
Monocytes Relative: 9.9 % (ref 3.0–12.0)
Neutro Abs: 1.5 10*3/uL (ref 1.4–7.7)
Neutrophils Relative %: 42.5 % — ABNORMAL LOW (ref 43.0–77.0)
Platelets: 338 10*3/uL (ref 150.0–400.0)
RBC: 3.72 Mil/uL — AB (ref 3.87–5.11)
RDW: 14.8 % (ref 11.5–15.5)
WBC: 3.6 10*3/uL — AB (ref 4.0–10.5)

## 2013-06-29 NOTE — Assessment & Plan Note (Signed)
cmet today after change from hctz to aldactone

## 2013-06-29 NOTE — Progress Notes (Signed)
Pre visit review using our clinic review tool, if applicable. No additional management support is needed unless otherwise documented below in the visit note. 

## 2013-06-29 NOTE — Assessment & Plan Note (Signed)
Resolved with low dose aldactone Lab today No cardiac symptom Overall much better  Wt is dwn 8 lb also

## 2013-06-29 NOTE — Progress Notes (Signed)
Subjective:    Patient ID: Amy Fry, female    DOB: 1945-03-26, 68 y.o.   MRN: [id number]  HPI Here for f/u of edema and HTN   Last visit changed her med from hctz to spironolactone  Swelling in legs is much better ---? "fine"   Low K in the past with hctz Now no cramping or other symptoms  Has a varied diet with K rich foods   bp is good  BP Readings from Last 3 Encounters:  06/29/13 122/82  06/15/13 116/72  06/11/13 126/72    Need labs today  For bmet and anemia  She does not tolerate oral iron but became anemic after recent surgery Some fatigue  No light headedness   Patient Active Problem List   Diagnosis Date Noted  . Pedal edema 06/15/2013  . Acute blood loss anemia 06/15/2013  . Constipation 06/15/2013  . Fracture of humerus, proximal, left, closed 06/09/2013  . Parotid gland fullness 08/19/2012  . Encounter for Medicare annual wellness exam 06/17/2012  . Hypokalemia 06/17/2012  . Routine general medical examination at a health care facility 02/18/2011  . UNSPECIFIED VITAMIN D DEFICIENCY 07/30/2008  . DERMATOPHYTOSIS, NAIL 07/26/2006  . DIABETES MELLITUS, TYPE II 07/25/2006  . HYPERCHOLESTEROLEMIA 07/25/2006  . HYPERTENSION 07/25/2006  . OSTEOPENIA 07/25/2006   Past Medical History  Diagnosis Date  . Diabetes mellitus     type II  . Hypertension   . Osteopenia   . Early cataracts, bilateral     no sx yet   . Hyperlipidemia   . Arthritis    Past Surgical History  Procedure Laterality Date  . Laparoscopic cholecystectomy    . Oophorectomy    . Cystoscopy  10-1999  . Fracture surgery  2010    rib fx- from fall   . Orif humerus fracture Left 06/09/2013    Procedure: OPEN REDUCTION INTERNAL FIXATION (ORIF) PROXIMAL HUMERUS FRACTURE;  Surgeon: Rozanna Box, MD;  Location: [facility];  Service: Orthopedics;  Laterality: Left;   History  Substance Use Topics  . Smoking status: Never Smoker   . Smokeless tobacco: Never Used  . Alcohol Use: No     Family History  Problem Relation Age of Onset  . Heart attack Mother   . Depression Sister   . Heart attack Brother   . Stroke Brother   . Colon cancer Neg Hx   . Stomach cancer Neg Hx   . Esophageal cancer Neg Hx   . Rectal cancer Neg Hx    Allergies  Allergen Reactions  . Alendronate Sodium     REACTION: reaction not known  . Atorvastatin     REACTION: headaches  . Pravastatin Sodium     REACTION: headaches and arm pain   Current Outpatient Prescriptions on File Prior to Visit  Medication Sig Dispense Refill  . Ascorbic Acid (VITAMIN C) 1000 MG tablet Take 500 mg by mouth daily.       Marland Kitchen aspirin 81 MG EC tablet Take 81 mg by mouth daily.        . Biotin 5000 MCG TABS Take 5,000 mcg by mouth daily.       . Calcium Carbonate-Vit D-Min (CALCIUM 1200 PO) Take 1 tablet by mouth 2 (two) times daily.      . Cholecalciferol (VITAMIN D) 2000 UNITS CAPS Take 2,000 Units by mouth daily.       Marland Kitchen CRANBERRY PO Take 4,200 Units by mouth daily.      Marland Kitchen docusate  sodium 100 MG CAPS Take 100 mg by mouth 2 (two) times daily.  20 capsule  1  . gemfibrozil (LOPID) 600 MG tablet Take 1 tablet (600 mg total) by mouth 2 (two) times daily.  60 tablet  11  . lisinopril (PRINIVIL,ZESTRIL) 5 MG tablet Take 1 tablet (5 mg total) by mouth daily.  30 tablet  11  . lovastatin (MEVACOR) 20 MG tablet Take 1 tablet (20 mg total) by mouth daily.  30 tablet  11  . metFORMIN (GLUCOPHAGE) 500 MG tablet Take 1 tablet (500 mg total) by mouth 2 (two) times daily with a meal.  60 tablet  11  . Omega-3 300 MG CAPS Take 300 mg by mouth 2 (two) times daily.       Marland Kitchen spironolactone (ALDACTONE) 25 MG tablet Take 0.5 tablets (12.5 mg total) by mouth daily.  15 tablet  3  . vitamin B-12 (CYANOCOBALAMIN) 1000 MCG tablet Take 1,000 mcg by mouth daily.      . traMADol (ULTRAM) 50 MG tablet Take 1-2 tablets (50-100 mg total) by mouth every 6 (six) hours as needed for moderate pain or severe pain.  40 tablet  1   No current  facility-administered medications on file prior to visit.     Review of Systems Review of Systems  Constitutional: Negative for fever, appetite change, fatigue and unexpected weight change.  Eyes: Negative for pain and visual disturbance.  Respiratory: Negative for cough and shortness of breath.   Cardiovascular: Negative for cp or palpitations    Gastrointestinal: Negative for nausea, diarrhea and constipation.  Genitourinary: Negative for urgency and frequency.  Skin: Negative for pallor or rash   Neurological: Negative for weakness, light-headedness, numbness and headaches.  Hematological: Negative for adenopathy. Does not bruise/bleed easily.  Psychiatric/Behavioral: Negative for dysphoric mood. The patient is not nervous/anxious.         Objective:   Physical Exam  Constitutional: She appears well-developed and well-nourished. No distress.  obese and well appearing   HENT:  Head: Normocephalic and atraumatic.  Mouth/Throat: Oropharynx is clear and moist.  Eyes: Conjunctivae and EOM are normal. Pupils are equal, round, and reactive to light.  Neck: Normal range of motion. Neck supple. No JVD present.  Cardiovascular: Normal rate and regular rhythm.   Pulmonary/Chest: Effort normal and breath sounds normal. No respiratory distress. She has no wheezes. She has no rales.  No crackles   Abdominal: Soft. Bowel sounds are normal.  Musculoskeletal: She exhibits no edema.  Edema is resolved   Lymphadenopathy:    She has no cervical adenopathy.  Neurological: She is alert. She has normal reflexes.  Skin: Skin is warm and dry. No rash noted. No pallor.  Psychiatric: She has a normal mood and affect.          Assessment & Plan:   Problem List Items Addressed This Visit     Cardiovascular and Mediastinum   HYPERTENSION - Primary     bp is well controlled with change from hctz to aldactone  cmet today  Pedal edema is resolved     Relevant Orders      Comprehensive  metabolic panel (Completed)     Other   Hypokalemia     cmet today after change from hctz to aldactone     Relevant Orders      Comprehensive metabolic panel (Completed)   Pedal edema     Resolved with low dose aldactone Lab today No cardiac symptom Overall much better  Wt  is dwn 8 lb also     Acute blood loss anemia     Cbc today post op Pt does not tol oral iron     Relevant Orders      CBC with Differential (Completed)

## 2013-06-29 NOTE — Assessment & Plan Note (Signed)
Cbc today post op Pt does not tol oral iron

## 2013-06-29 NOTE — Patient Instructions (Signed)
Labs today  Continue current medicines Blood pressure looks good  Follow up for annual exam in mid to late fall with labs prior

## 2013-06-29 NOTE — Assessment & Plan Note (Signed)
bp is well controlled with change from hctz to aldactone  cmet today  Pedal edema is resolved

## 2013-06-30 ENCOUNTER — Encounter: Payer: Self-pay | Admitting: Family Medicine

## 2013-07-10 DIAGNOSIS — E876 Hypokalemia: Secondary | ICD-10-CM

## 2013-07-10 DIAGNOSIS — R609 Edema, unspecified: Secondary | ICD-10-CM

## 2013-07-10 DIAGNOSIS — S42209A Unspecified fracture of upper end of unspecified humerus, initial encounter for closed fracture: Secondary | ICD-10-CM

## 2013-07-10 DIAGNOSIS — I10 Essential (primary) hypertension: Secondary | ICD-10-CM

## 2013-07-10 DIAGNOSIS — E559 Vitamin D deficiency, unspecified: Secondary | ICD-10-CM

## 2013-07-10 DIAGNOSIS — D62 Acute posthemorrhagic anemia: Secondary | ICD-10-CM

## 2013-07-10 DIAGNOSIS — E119 Type 2 diabetes mellitus without complications: Secondary | ICD-10-CM

## 2013-07-10 DIAGNOSIS — K5900 Constipation, unspecified: Secondary | ICD-10-CM

## 2013-07-28 ENCOUNTER — Telehealth: Payer: Self-pay | Admitting: Family Medicine

## 2013-07-28 NOTE — Telephone Encounter (Signed)
Patient called back and was upset that more lab work had to be done.  She said she'd think about it and call back to schedule the lab appointment.

## 2013-07-28 NOTE — Telephone Encounter (Signed)
Diabetic Bundle.  Pt needs lab appt for LDL.  Left vm for pt to return call. 

## 2013-07-30 ENCOUNTER — Telehealth: Payer: Self-pay | Admitting: Family Medicine

## 2013-07-30 DIAGNOSIS — E7800 Pure hypercholesterolemia, unspecified: Secondary | ICD-10-CM

## 2013-07-30 NOTE — Telephone Encounter (Signed)
Message copied by Abner Greenspan on Thu Jul 30, 2013  9:12 PM ------      Message from: Ellamae Sia      Created: Wed Jul 29, 2013  3:25 PM      Regarding: Lab orders for Fridat,7.17.15       Diabetic bundle, LDL order please, along with anything else you need, Thanks T ------

## 2013-07-31 ENCOUNTER — Other Ambulatory Visit (INDEPENDENT_AMBULATORY_CARE_PROVIDER_SITE_OTHER): Payer: Commercial Managed Care - HMO

## 2013-07-31 DIAGNOSIS — E7800 Pure hypercholesterolemia, unspecified: Secondary | ICD-10-CM

## 2013-07-31 LAB — LIPID PANEL
CHOLESTEROL: 157 mg/dL (ref 0–200)
HDL: 64.1 mg/dL (ref >39.00)
LDL Cholesterol: 75 mg/dL (ref 0–99)
NonHDL: 92.9
Total CHOL/HDL Ratio: 2
Triglycerides: 91 mg/dL (ref 0.0–149.0)
VLDL: 18.2 mg/dL (ref 0.0–40.0)

## 2013-08-03 ENCOUNTER — Encounter: Payer: Self-pay | Admitting: *Deleted

## 2013-08-24 ENCOUNTER — Ambulatory Visit (INDEPENDENT_AMBULATORY_CARE_PROVIDER_SITE_OTHER): Payer: Commercial Managed Care - HMO | Admitting: Family Medicine

## 2013-08-24 ENCOUNTER — Encounter: Payer: Self-pay | Admitting: Family Medicine

## 2013-08-24 VITALS — BP 120/70 | HR 70 | Temp 98.3°F | Ht 63.75 in | Wt 176.5 lb

## 2013-08-24 DIAGNOSIS — B029 Zoster without complications: Secondary | ICD-10-CM

## 2013-08-24 DIAGNOSIS — I10 Essential (primary) hypertension: Secondary | ICD-10-CM

## 2013-08-24 MED ORDER — GABAPENTIN 300 MG PO CAPS: 300.0000 mg | ORAL_CAPSULE | Freq: Three times a day (TID) | ORAL | Status: DC

## 2013-08-24 MED ORDER — SPIRONOLACTONE 25 MG PO TABS: 12.5000 mg | ORAL_TABLET | Freq: Every day | ORAL | Status: DC

## 2013-08-24 NOTE — Progress Notes (Signed)
Pre visit review using our clinic review tool, if applicable. No additional management support is needed unless otherwise documented below in the visit note. 

## 2013-08-24 NOTE — Patient Instructions (Signed)

## 2013-08-24 NOTE — Progress Notes (Signed)
[place] [address] Phone: [phone] Fax: [phone]  Patient ID: Amy Fry MRN: [id number], DOB: 1945/08/01, 68 y.o. Date of Encounter: 08/24/2013  Primary Physician:  Loura Pardon, MD   Chief Complaint: Herpes Zoster   Subjective:   History of Present Illness:  Amy Fry is a 68 y.o. very pleasant female patient who presents with the following:  L axilla: ? Axilla shingles  Presents with a left-sided axillary rash and rash on her LEFT posterior thorax. This is been intermittently bothering her for at least 2 or 3 weeks, it has progressively gotten worse, and she still has some severe pain. This is making her quite distraught. She is not tried anything that is really made any sort of difference whatsoever.  Past Medical History, Surgical History, Social History, Family History, Problem List, Medications, and Allergies have been reviewed and updated if relevant.  Review of Systems: ROS: GEN: Acute illness details above GI: Tolerating PO intake GU: maintaining adequate hydration and urination Pulm: No SOB Interactive and getting along well at home.  Otherwise, ROS is as per the HPI.   Objective:   Physical Examination: BP 120/70  Pulse 70  Temp(Src) 98.3 F (36.8 C) (Oral)  Ht 5' 3.75" (1.619 m)  Wt 176 lb 8 oz (80.06 kg)  BMI 30.54 kg/m2   GEN: WDWN, NAD, Non-toxic, A & O x 3 HEENT: Atraumatic, Normocephalic. Neck supple. No masses, No LAD. Ears and Nose: No external deformity. CV: RRR, No M/G/R. No JVD. No thrill. No extra heart sounds. PULM: CTA B, no wheezes, crackles, rhonchi. No retractions. No resp. distress. No accessory muscle use. EXTR: No c/c/e NEURO Normal gait.  PSYCH: Normally interactive. Conversant. Not depressed or anxious appearing.  Calm demeanor.    In the axilla and posterior thorax, the patient has intermittent scattered lesions with some healing vesicles. This portion of the physical examination was  chaperoned by Hedy Camara, CMA.    Laboratory and Imaging Data:  Assessment & Plan:   Herpes zoster  Beyond point where valtrex would help. Titrate up neuropathic pain meds.  Refill HTN meds  New Prescriptions   GABAPENTIN (NEURONTIN) 300 MG CAPSULE    Take 1 capsule (300 mg total) by mouth 3 (three) times daily.   Discontinued Medications   No medications on file   Modified Medications   Modified Medication Previous Medication   SPIRONOLACTONE (ALDACTONE) 25 MG TABLET spironolactone (ALDACTONE) 25 MG tablet      Take 0.5 tablets (12.5 mg total) by mouth daily.    Take 0.5 tablets (12.5 mg total) by mouth daily.   No orders of the defined types were placed in this encounter.   Follow-up: No Follow-up on file. Unless noted above, the patient is to follow-up if symptoms worsen. Red flags were reviewed with the patient.  Signed,  Maud Deed. Ellina Sivertsen, MD, [facility]  Current Medications at Discharge:   Medication List       This list is accurate as of: 08/24/13 11:59 PM.  Always use your most recent med list.               acetaminophen 325 MG tablet  Commonly known as:  TYLENOL  Take 325 mg by mouth as needed.     aspirin 81 MG EC tablet  Take 81 mg by mouth daily.     Biotin 5000 MCG Tabs  Take 5,000 mcg by mouth daily.     CALCIUM 1200 PO  Take 1 tablet by mouth 2 (two) times daily.     CRANBERRY PO  Take 4,200 Units by mouth daily.     DSS 100 MG Caps  Take 100 mg by mouth 2 (two) times daily.     gabapentin 300 MG capsule  Commonly known as:  NEURONTIN  Take 1 capsule (300 mg total) by mouth 3 (three) times daily.     gemfibrozil 600 MG tablet  Commonly known as:  LOPID  Take 1 tablet (600 mg total) by mouth 2 (two) times daily.     lisinopril 5 MG tablet  Commonly known as:  PRINIVIL,ZESTRIL  Take 1 tablet (5 mg total) by mouth daily.     lovastatin 20 MG tablet  Commonly known as:  MEVACOR  Take 1 tablet (20 mg total) by mouth  daily.     metFORMIN 500 MG tablet  Commonly known as:  GLUCOPHAGE  Take 1 tablet (500 mg total) by mouth 2 (two) times daily with a meal.     Omega-3 300 MG Caps  Take 300 mg by mouth 2 (two) times daily.     spironolactone 25 MG tablet  Commonly known as:  ALDACTONE  Take 0.5 tablets (12.5 mg total) by mouth daily.     traMADol 50 MG tablet  Commonly known as:  ULTRAM  Take 1-2 tablets (50-100 mg total) by mouth every 6 (six) hours as needed for moderate pain or severe pain.     vitamin B-12 1000 MCG tablet  Commonly known as:  CYANOCOBALAMIN  Take 1,000 mcg by mouth daily.     vitamin C 1000 MG tablet  Take 500 mg by mouth daily.     Vitamin D 2000 UNITS Caps  Take 2,000 Units by mouth daily.

## 2013-09-22 ENCOUNTER — Encounter: Payer: Self-pay | Admitting: Family Medicine

## 2013-09-22 ENCOUNTER — Ambulatory Visit (INDEPENDENT_AMBULATORY_CARE_PROVIDER_SITE_OTHER): Payer: Commercial Managed Care - HMO | Admitting: Family Medicine

## 2013-09-22 VITALS — BP 122/78 | HR 68 | Temp 98.2°F | Ht 63.75 in | Wt 177.8 lb

## 2013-09-22 DIAGNOSIS — R21 Rash and other nonspecific skin eruption: Secondary | ICD-10-CM

## 2013-09-22 MED ORDER — VALACYCLOVIR HCL 1 G PO TABS: 1000.0000 mg | ORAL_TABLET | Freq: Three times a day (TID) | ORAL | Status: DC

## 2013-09-22 NOTE — Progress Notes (Signed)
Pre visit review using our clinic review tool, if applicable. No additional management support is needed unless otherwise documented below in the visit note. 

## 2013-09-22 NOTE — Progress Notes (Signed)
Subjective:    Patient ID: Amy Fry, female    DOB: 10-07-1945, 68 y.o.   MRN: [id number]  HPI Here for f/u of shingles   She has had "this problem" on and off from surgery in May   Rash in her L axilla  Started after her shoulder surgery  It has come and gone   Does not itch  It hurts - hard to tell what kind of pain it is -not severe  Burning sensation   Since she saw Dr Lorelei Pont it got better and then came back   Patient Active Problem List   Diagnosis Date Noted  . Pedal edema 06/15/2013  . Acute blood loss anemia 06/15/2013  . Constipation 06/15/2013  . Fracture of humerus, proximal, left, closed 06/09/2013  . Parotid gland fullness 08/19/2012  . Encounter for Medicare annual wellness exam 06/17/2012  . Hypokalemia 06/17/2012  . Routine general medical examination at a health care facility 02/18/2011  . UNSPECIFIED VITAMIN D DEFICIENCY 07/30/2008  . DERMATOPHYTOSIS, NAIL 07/26/2006  . DIABETES MELLITUS, TYPE II 07/25/2006  . HYPERCHOLESTEROLEMIA 07/25/2006  . HYPERTENSION 07/25/2006  . OSTEOPENIA 07/25/2006   Past Medical History  Diagnosis Date  . Diabetes mellitus     type II  . Hypertension   . Osteopenia   . Early cataracts, bilateral     no sx yet   . Hyperlipidemia   . Arthritis    Past Surgical History  Procedure Laterality Date  . Laparoscopic cholecystectomy    . Oophorectomy    . Cystoscopy  10-1999  . Fracture surgery  2010    rib fx- from fall   . Orif humerus fracture Left 06/09/2013    Procedure: OPEN REDUCTION INTERNAL FIXATION (ORIF) PROXIMAL HUMERUS FRACTURE;  Surgeon: Rozanna Box, MD;  Location: [facility];  Service: Orthopedics;  Laterality: Left;   History  Substance Use Topics  . Smoking status: Never Smoker   . Smokeless tobacco: Never Used  . Alcohol Use: No   Family History  Problem Relation Age of Onset  . Heart attack Mother   . Depression Sister   . Heart attack Brother   . Stroke Brother   . Colon cancer Neg  Hx   . Stomach cancer Neg Hx   . Esophageal cancer Neg Hx   . Rectal cancer Neg Hx    Allergies  Allergen Reactions  . Alendronate Sodium     REACTION: reaction not known  . Atorvastatin     REACTION: headaches  . Pravastatin Sodium     REACTION: headaches and arm pain   Current Outpatient Prescriptions on File Prior to Visit  Medication Sig Dispense Refill  . Ascorbic Acid (VITAMIN C) 1000 MG tablet Take 500 mg by mouth daily.       Marland Kitchen aspirin 81 MG EC tablet Take 81 mg by mouth daily.        . Biotin 5000 MCG TABS Take 5,000 mcg by mouth daily.       . Calcium Carbonate-Vit D-Min (CALCIUM 1200 PO) Take 1 tablet by mouth 2 (two) times daily.      . Cholecalciferol (VITAMIN D) 2000 UNITS CAPS Take 2,000 Units by mouth daily.       Marland Kitchen CRANBERRY PO Take 4,200 Units by mouth daily.      Marland Kitchen docusate sodium 100 MG CAPS Take 100 mg by mouth 2 (two) times daily.  20 capsule  1  . gemfibrozil (LOPID) 600 MG tablet Take 1 tablet (  600 mg total) by mouth 2 (two) times daily.  60 tablet  11  . lisinopril (PRINIVIL,ZESTRIL) 5 MG tablet Take 1 tablet (5 mg total) by mouth daily.  30 tablet  11  . lovastatin (MEVACOR) 20 MG tablet Take 1 tablet (20 mg total) by mouth daily.  30 tablet  11  . metFORMIN (GLUCOPHAGE) 500 MG tablet Take 1 tablet (500 mg total) by mouth 2 (two) times daily with a meal.  60 tablet  11  . Omega-3 300 MG CAPS Take 300 mg by mouth 2 (two) times daily.       Marland Kitchen spironolactone (ALDACTONE) 25 MG tablet Take 0.5 tablets (12.5 mg total) by mouth daily.  15 tablet  3  . traMADol (ULTRAM) 50 MG tablet Take 1-2 tablets (50-100 mg total) by mouth every 6 (six) hours as needed for moderate pain or severe pain.  40 tablet  1  . vitamin B-12 (CYANOCOBALAMIN) 1000 MCG tablet Take 1,000 mcg by mouth daily.       No current facility-administered medications on file prior to visit.      Review of Systems Review of Systems  Constitutional: Negative for fever, appetite change, fatigue and  unexpected weight change.  Eyes: Negative for pain and visual disturbance.  Respiratory: Negative for cough and shortness of breath.   Cardiovascular: Negative for cp or palpitations    Gastrointestinal: Negative for nausea, diarrhea and constipation.  Genitourinary: Negative for urgency and frequency.  Skin: Negative for pallor and pos for rash in L axilla that is uncomfortable and re occurs  Neurological: Negative for weakness, light-headedness, numbness and headaches.  Hematological: Negative for adenopathy. Does not bruise/bleed easily.  Psychiatric/Behavioral: Negative for dysphoric mood. The patient is not nervous/anxious.         Objective:   Physical Exam  Constitutional: She appears well-developed and well-nourished. No distress.  obese and well appearing   HENT:  Head: Normocephalic and atraumatic.  Mouth/Throat: Oropharynx is clear and moist.  Eyes: Conjunctivae and EOM are normal. Pupils are equal, round, and reactive to light. Right eye exhibits no discharge. Left eye exhibits no discharge.  Neck: Normal range of motion. Neck supple.  Cardiovascular: Normal rate and regular rhythm.   Musculoskeletal: She exhibits tenderness. She exhibits no edema.  Lymphadenopathy:    She has no cervical adenopathy.  Neurological: She is alert. She has normal reflexes. No cranial nerve deficit. She exhibits normal muscle tone. Coordination normal.  Skin: Skin is warm and dry. Rash noted. There is erythema. No pallor.  Rash in L axilla- some areas of small vesicles Mildly sensitive/tender No adenopathy  Psychiatric: She has a normal mood and affect.          Assessment & Plan:   Problem List Items Addressed This Visit     Musculoskeletal and Integument   Rash and nonspecific skin eruption - Primary     Pt has a recurrent rash with small vesicles in L axilla (begain after her shoulder surgery) At first suspected zoster but now that it continues to re occur in the same area ? If  herpes simplex  tx with valtrex 1g tid and see if improvement  If not - consider derm consult  Of note-pt declines shingles vaccine because her friend had a side effect from it

## 2013-09-22 NOTE — Patient Instructions (Signed)
I want to treat the rash under your arm with valtrex (this is an antiviral medicine that we use for any type of herpes breakout including shingles and chicken pox) Take the valtrex as directed If this does not help- let me know please and I will refer you to dermatology

## 2013-09-22 NOTE — Assessment & Plan Note (Signed)
Pt has a recurrent rash with small vesicles in L axilla (begain after her shoulder surgery) At first suspected zoster but now that it continues to re occur in the same area ? If herpes simplex  tx with valtrex 1g tid and see if improvement  If not - consider derm consult  Of note-pt declines shingles vaccine because her friend had a side effect from it

## 2014-01-23 ENCOUNTER — Other Ambulatory Visit: Payer: Self-pay | Admitting: Family Medicine

## 2014-02-16 LAB — HM DEXA SCAN

## 2014-02-16 LAB — HM MAMMOGRAPHY: HM MAMMO: NORMAL

## 2014-03-16 ENCOUNTER — Encounter: Payer: Self-pay | Admitting: Family Medicine

## 2014-03-16 ENCOUNTER — Ambulatory Visit (INDEPENDENT_AMBULATORY_CARE_PROVIDER_SITE_OTHER): Payer: PPO | Admitting: Family Medicine

## 2014-03-16 VITALS — BP 118/74 | HR 71 | Temp 98.2°F | Ht 63.75 in | Wt 180.8 lb

## 2014-03-16 DIAGNOSIS — J069 Acute upper respiratory infection, unspecified: Secondary | ICD-10-CM

## 2014-03-16 DIAGNOSIS — B9789 Other viral agents as the cause of diseases classified elsewhere: Principal | ICD-10-CM

## 2014-03-16 DIAGNOSIS — B009 Herpesviral infection, unspecified: Secondary | ICD-10-CM

## 2014-03-16 MED ORDER — BENZONATATE 200 MG PO CAPS: 200.0000 mg | ORAL_CAPSULE | Freq: Three times a day (TID) | ORAL | Status: DC | PRN

## 2014-03-16 MED ORDER — ACYCLOVIR 5 % EX OINT: 1.0000 "application " | TOPICAL_OINTMENT | CUTANEOUS | Status: DC

## 2014-03-16 NOTE — Progress Notes (Signed)
Pre visit review using our clinic review tool, if applicable. No additional management support is needed unless otherwise documented below in the visit note. 

## 2014-03-16 NOTE — Progress Notes (Signed)
Subjective:    Patient ID: Amy Fry, female    DOB: November 19, 1945, 69 y.o.   MRN: [id number]  HPI Here for uri symptoms for over a week   Taking otc med - "cold tablet for cough and cold for HBP) Not getting better   Ears stopped up  Headache  Post nasal drip  Cough -esp at night - non prod No fever -no aches and chills   No GI symptoms   Rash under L breast- keeps coming back  No pus  Just occ blister pops up and then it heals Not itchy Little burning -not severe like shingles   Patient Active Problem List   Diagnosis Date Noted  . Viral URI with cough 03/16/2014  . Herpes simplex 03/16/2014  . Rash and nonspecific skin eruption 09/22/2013  . Pedal edema 06/15/2013  . Acute blood loss anemia 06/15/2013  . Constipation 06/15/2013  . Fracture of humerus, proximal, left, closed 06/09/2013  . Parotid gland fullness 08/19/2012  . Encounter for Medicare annual wellness exam 06/17/2012  . Hypokalemia 06/17/2012  . Routine general medical examination at a health care facility 02/18/2011  . UNSPECIFIED VITAMIN D DEFICIENCY 07/30/2008  . DERMATOPHYTOSIS, NAIL 07/26/2006  . DIABETES MELLITUS, TYPE II 07/25/2006  . HYPERCHOLESTEROLEMIA 07/25/2006  . HYPERTENSION 07/25/2006  . OSTEOPENIA 07/25/2006   Past Medical History  Diagnosis Date  . Diabetes mellitus     type II  . Hypertension   . Osteopenia   . Early cataracts, bilateral     no sx yet   . Hyperlipidemia   . Arthritis    Past Surgical History  Procedure Laterality Date  . Laparoscopic cholecystectomy    . Oophorectomy    . Cystoscopy  10-1999  . Fracture surgery  2010    rib fx- from fall   . Orif humerus fracture Left 06/09/2013    Procedure: OPEN REDUCTION INTERNAL FIXATION (ORIF) PROXIMAL HUMERUS FRACTURE;  Surgeon: Rozanna Box, MD;  Location: [place];  Service: Orthopedics;  Laterality: Left;   History  Substance Use Topics  . Smoking status: Never Smoker   . Smokeless tobacco: Never Used    . Alcohol Use: No   Family History  Problem Relation Age of Onset  . Heart attack Mother   . Depression Sister   . Heart attack Brother   . Stroke Brother   . Colon cancer Neg Hx   . Stomach cancer Neg Hx   . Esophageal cancer Neg Hx   . Rectal cancer Neg Hx    Allergies  Allergen Reactions  . Alendronate Sodium     REACTION: reaction not known  . Atorvastatin     REACTION: headaches  . Pravastatin Sodium     REACTION: headaches and arm pain   Current Outpatient Prescriptions on File Prior to Visit  Medication Sig Dispense Refill  . Ascorbic Acid (VITAMIN C) 1000 MG tablet Take 500 mg by mouth daily.     Marland Kitchen aspirin 81 MG EC tablet Take 81 mg by mouth daily.      . Biotin 5000 MCG TABS Take 5,000 mcg by mouth daily.     . Calcium Carbonate-Vit D-Min (CALCIUM 1200 PO) Take 1 tablet by mouth 2 (two) times daily.    . Cholecalciferol (VITAMIN D) 2000 UNITS CAPS Take 2,000 Units by mouth daily.     Marland Kitchen CRANBERRY PO Take 4,200 Units by mouth daily.    Marland Kitchen docusate sodium 100 MG CAPS Take 100 mg by mouth  2 (two) times daily. 20 capsule 1  . gemfibrozil (LOPID) 600 MG tablet Take 1 tablet (600 mg total) by mouth 2 (two) times daily. 60 tablet 11  . lisinopril (PRINIVIL,ZESTRIL) 5 MG tablet Take 1 tablet (5 mg total) by mouth daily. 30 tablet 11  . lovastatin (MEVACOR) 20 MG tablet Take 1 tablet (20 mg total) by mouth daily. 30 tablet 11  . metFORMIN (GLUCOPHAGE) 500 MG tablet Take 1 tablet (500 mg total) by mouth 2 (two) times daily with a meal. 60 tablet 11  . Omega-3 300 MG CAPS Take 300 mg by mouth 2 (two) times daily.     Marland Kitchen spironolactone (ALDACTONE) 25 MG tablet TAKE 1/2 A TABLET BY MOUTH DAILY 15 tablet 2  . traMADol (ULTRAM) 50 MG tablet Take 1-2 tablets (50-100 mg total) by mouth every 6 (six) hours as needed for moderate pain or severe pain. 40 tablet 1  . valACYclovir (VALTREX) 1000 MG tablet Take 1 tablet (1,000 mg total) by mouth 3 (three) times daily. 21 tablet 0  . vitamin  B-12 (CYANOCOBALAMIN) 1000 MCG tablet Take 1,000 mcg by mouth daily.     No current facility-administered medications on file prior to visit.      Review of Systems Review of Systems  Constitutional: Negative for fever, appetite change,  and unexpected weight change.  ENT pos for cong and rhinorrhea and neg for sinus pain  Eyes: Negative for pain and visual disturbance.  Respiratory: Negative for wheeze and shortness of breath.   Cardiovascular: Negative for cp or palpitations    Gastrointestinal: Negative for nausea, diarrhea and constipation.  Genitourinary: Negative for urgency and frequency.  Skin: Negative for pallor and pos for rash   Neurological: Negative for weakness, light-headedness, numbness and headaches.  Hematological: Negative for adenopathy. Does not bruise/bleed easily.  Psychiatric/Behavioral: Negative for dysphoric mood. The patient is not nervous/anxious.         Objective:   Physical Exam  Constitutional: She appears well-developed and well-nourished. No distress.  HENT:  Head: Normocephalic and atraumatic.  Right Ear: External ear normal.  Left Ear: External ear normal.  Mouth/Throat: Oropharynx is clear and moist. No oropharyngeal exudate.  Nares are injected and congested  No sinus tenderness Clear rhinorrhea   Eyes: Conjunctivae and EOM are normal. Pupils are equal, round, and reactive to light. No scleral icterus.  Neck: Normal range of motion. Neck supple.  Cardiovascular: Normal rate and regular rhythm.   Pulmonary/Chest: Effort normal and breath sounds normal. No respiratory distress. She has no wheezes. She has no rales.  Lymphadenopathy:    She has no cervical adenopathy.  Neurological: She is alert.  Skin: Skin is warm and dry. Rash noted. No erythema. No pallor.  Few excoriated vesicles under L breast -no signs of infection   Psychiatric: She has a normal mood and affect.          Assessment & Plan:   Problem List Items Addressed  This Visit      Respiratory   Viral URI with cough - Primary    Disc symptomatic care - see instructions on AVS  Tessalon for cough  Update if not starting to improve in a week or if worsening  Re assuring exam today      Relevant Medications   ZOVIRAX 5 % EX OINT     Other   Herpes simplex    Recurrent rash -since her last shoulder surgery- at times in axilla L or under breasts  Today-  excoriated vesicle under L breast  Will try tx with zovirax ointment and update      Relevant Medications   ZOVIRAX 5 % EX OINT

## 2014-03-16 NOTE — Patient Instructions (Signed)
I think you have a head and chest cold Zyrtec will help runny nose and drip  mucinex DM would help cough and congestion  Try tessalon for cough as well  Drink fluids and rest   I think the rash you have looks like herpes simplex (like cold sores)  Try the zovirax ointment  Update me if it gets worse

## 2014-03-16 NOTE — Assessment & Plan Note (Signed)
Recurrent rash -since her last shoulder surgery- at times in axilla L or under breasts  Today- excoriated vesicle under L breast  Will try tx with zovirax ointment and update

## 2014-03-16 NOTE — Assessment & Plan Note (Signed)
Disc symptomatic care - see instructions on AVS  Tessalon for cough  Update if not starting to improve in a week or if worsening  Re assuring exam today

## 2014-04-24 ENCOUNTER — Other Ambulatory Visit: Payer: Self-pay | Admitting: Family Medicine

## 2014-05-13 ENCOUNTER — Telehealth: Payer: Self-pay | Admitting: Family Medicine

## 2014-05-13 DIAGNOSIS — I10 Essential (primary) hypertension: Secondary | ICD-10-CM

## 2014-05-13 DIAGNOSIS — E7800 Pure hypercholesterolemia, unspecified: Secondary | ICD-10-CM

## 2014-05-13 DIAGNOSIS — D62 Acute posthemorrhagic anemia: Secondary | ICD-10-CM

## 2014-05-13 DIAGNOSIS — E119 Type 2 diabetes mellitus without complications: Secondary | ICD-10-CM

## 2014-05-13 NOTE — Telephone Encounter (Signed)
-----   Message from Ellamae Sia sent at 05/10/2014  4:20 PM EDT ----- Regarding: Lab orders for Friday, 4.29.16 Lab orders for a f/u appt

## 2014-05-14 ENCOUNTER — Other Ambulatory Visit (INDEPENDENT_AMBULATORY_CARE_PROVIDER_SITE_OTHER): Payer: PPO

## 2014-05-14 DIAGNOSIS — E7800 Pure hypercholesterolemia, unspecified: Secondary | ICD-10-CM

## 2014-05-14 DIAGNOSIS — E119 Type 2 diabetes mellitus without complications: Secondary | ICD-10-CM

## 2014-05-14 DIAGNOSIS — I10 Essential (primary) hypertension: Secondary | ICD-10-CM

## 2014-05-14 DIAGNOSIS — D62 Acute posthemorrhagic anemia: Secondary | ICD-10-CM | POA: Diagnosis not present

## 2014-05-14 LAB — COMPREHENSIVE METABOLIC PANEL
ALT: 15 U/L (ref 0–35)
AST: 17 U/L (ref 0–37)
Albumin: 4.1 g/dL (ref 3.5–5.2)
Alkaline Phosphatase: 86 U/L (ref 39–117)
BILIRUBIN TOTAL: 0.4 mg/dL (ref 0.2–1.2)
BUN: 20 mg/dL (ref 6–23)
CALCIUM: 10.1 mg/dL (ref 8.4–10.5)
CO2: 28 meq/L (ref 19–32)
CREATININE: 0.82 mg/dL (ref 0.40–1.20)
Chloride: 103 mEq/L (ref 96–112)
GFR: 73.78 mL/min (ref >60.00)
Glucose, Bld: 97 mg/dL (ref 70–99)
Potassium: 4.4 mEq/L (ref 3.5–5.1)
Sodium: 139 mEq/L (ref 135–145)
Total Protein: 7 g/dL (ref 6.0–8.3)

## 2014-05-14 LAB — LIPID PANEL
CHOL/HDL RATIO: 3
Cholesterol: 141 mg/dL (ref 0–200)
HDL: 53.4 mg/dL (ref >39.00)
LDL Cholesterol: 68 mg/dL (ref 0–99)
NonHDL: 87.6
Triglycerides: 96 mg/dL (ref 0.0–149.0)
VLDL: 19.2 mg/dL (ref 0.0–40.0)

## 2014-05-14 LAB — CBC WITH DIFFERENTIAL/PLATELET
Basophils Absolute: 0 10*3/uL (ref 0.0–0.1)
Basophils Relative: 0.5 % (ref 0.0–3.0)
Eosinophils Absolute: 0.1 10*3/uL (ref 0.0–0.7)
Eosinophils Relative: 2 % (ref 0.0–5.0)
HEMATOCRIT: 38.8 % (ref 36.0–46.0)
Hemoglobin: 13.5 g/dL (ref 12.0–15.0)
LYMPHS ABS: 2.2 10*3/uL (ref 0.7–4.0)
Lymphocytes Relative: 36.9 % (ref 12.0–46.0)
MCHC: 34.8 g/dL (ref 30.0–36.0)
MCV: 90.7 fl (ref 78.0–100.0)
Monocytes Absolute: 0.5 10*3/uL (ref 0.1–1.0)
Monocytes Relative: 9.3 % (ref 3.0–12.0)
NEUTROS ABS: 3 10*3/uL (ref 1.4–7.7)
Neutrophils Relative %: 51.3 % (ref 43.0–77.0)
PLATELETS: 293 10*3/uL (ref 150.0–400.0)
RBC: 4.28 Mil/uL (ref 3.87–5.11)
RDW: 13.4 % (ref 11.5–15.5)
WBC: 5.9 10*3/uL (ref 4.0–10.5)

## 2014-05-14 LAB — HEMOGLOBIN A1C: HEMOGLOBIN A1C: 6.2 % (ref 4.6–6.5)

## 2014-05-20 ENCOUNTER — Other Ambulatory Visit (HOSPITAL_COMMUNITY): Payer: Self-pay | Admitting: Obstetrics and Gynecology

## 2014-05-21 ENCOUNTER — Ambulatory Visit (INDEPENDENT_AMBULATORY_CARE_PROVIDER_SITE_OTHER): Payer: PPO | Admitting: Family Medicine

## 2014-05-21 ENCOUNTER — Encounter: Payer: Self-pay | Admitting: Family Medicine

## 2014-05-21 VITALS — BP 116/80 | HR 62 | Temp 98.2°F | Ht 63.75 in | Wt 185.2 lb

## 2014-05-21 DIAGNOSIS — E7800 Pure hypercholesterolemia, unspecified: Secondary | ICD-10-CM

## 2014-05-21 DIAGNOSIS — I10 Essential (primary) hypertension: Secondary | ICD-10-CM

## 2014-05-21 DIAGNOSIS — E119 Type 2 diabetes mellitus without complications: Secondary | ICD-10-CM

## 2014-05-21 LAB — CYTOLOGY - PAP

## 2014-05-21 MED ORDER — GEMFIBROZIL 600 MG PO TABS: 600.0000 mg | ORAL_TABLET | Freq: Two times a day (BID) | ORAL | Status: DC

## 2014-05-21 MED ORDER — SPIRONOLACTONE 25 MG PO TABS: ORAL_TABLET | ORAL | Status: DC

## 2014-05-21 MED ORDER — LOVASTATIN 20 MG PO TABS
20.0000 mg | ORAL_TABLET | Freq: Every day | ORAL | Status: DC
Start: 2014-05-21 — End: 2015-06-08

## 2014-05-21 MED ORDER — METFORMIN HCL 500 MG PO TABS: 500.0000 mg | ORAL_TABLET | Freq: Two times a day (BID) | ORAL | Status: DC

## 2014-05-21 MED ORDER — LISINOPRIL 5 MG PO TABS: 5.0000 mg | ORAL_TABLET | Freq: Every day | ORAL | Status: DC

## 2014-05-21 NOTE — Assessment & Plan Note (Signed)
Lab Results  Component Value Date   HGBA1C 6.2 05/14/2014   Overall stable Rev diet/low glycemic She declines flu and pna vaccines  opth utd On ace F/u 50mo

## 2014-05-21 NOTE — Assessment & Plan Note (Signed)
Disc goals for lipids and reasons to control them Rev labs with pt Rev low sat fat diet in detail Overall stable on lovastatin and lopid

## 2014-05-21 NOTE — Progress Notes (Signed)
Pre visit review using our clinic review tool, if applicable. No additional management support is needed unless otherwise documented below in the visit note. 

## 2014-05-21 NOTE — Assessment & Plan Note (Signed)
bp in fair control at this time  BP Readings from Last 1 Encounters:  05/21/14 116/80   No changes needed Disc lifstyle change with low sodium diet and exercise  Labs reviewed meds refilled

## 2014-05-21 NOTE — Patient Instructions (Signed)
Take care of yourself Keep working on diet and exercise and weight loss Labs are stable  No change in medicines  Follow up in 6 months for annual exam with labs prior

## 2014-05-21 NOTE — Progress Notes (Signed)
Subjective:    Patient ID: Amy Fry, female    DOB: 11-25-1945, 69 y.o.   MRN: [id number]  HPI Here for f/u of chronic health problems   Has been fine overall  Nothing new going on   Wt is up 5 lb with bmi of 52   bp is stable today  No cp or palpitations or headaches or edema  No side effects to medicines  BP Readings from Last 3 Encounters:  05/21/14 116/80  03/16/14 118/74  09/22/13 122/78     Hyperlipidemia  On mevacor and lopid Lab Results  Component Value Date   CHOL 141 05/14/2014   CHOL 157 07/31/2013   CHOL 158 06/03/2012   Lab Results  Component Value Date   HDL 53.40 05/14/2014   HDL 64.10 07/31/2013   HDL 57.10 06/03/2012   Lab Results  Component Value Date   LDLCALC 68 05/14/2014   LDLCALC 75 07/31/2013   LDLCALC 83 06/03/2012   Lab Results  Component Value Date   TRIG 96.0 05/14/2014   TRIG 91.0 07/31/2013   TRIG 91.0 06/03/2012   Lab Results  Component Value Date   CHOLHDL 3 05/14/2014   CHOLHDL 2 07/31/2013   CHOLHDL 3 06/03/2012   No results found for: LDLDIRECT meds and diet  Does no always eat healthy (likes sweets the most) - tries to eat sugar free       Chemistry      Component Value Date/Time   NA 139 05/14/2014 0900   K 4.4 05/14/2014 0900   CL 103 05/14/2014 0900   CO2 28 05/14/2014 0900   BUN 20 05/14/2014 0900   CREATININE 0.82 05/14/2014 0900      Component Value Date/Time   CALCIUM 10.1 05/14/2014 0900   CALCIUM 8.9 06/11/2013 0615   ALKPHOS 86 05/14/2014 0900   AST 17 05/14/2014 0900   ALT 15 05/14/2014 0900   BILITOT 0.4 05/14/2014 0900       Diabetes Home sugar results -no unexpected highs or lows  DM diet - fair/ but still eats sugar free sweets  Exercise -more outdoor work / mowing etc  Symptoms A1C last  Lab Results  Component Value Date   HGBA1C 6.2 05/14/2014  down from 6.4  No problems with medications  Renal protection ace  Last eye exam - was in the fall/ Oct   Had her  mammogram and pap yesterday - went well   Did not have a flu vaccine -declines them  Declines pna vaccines as well    Patient Active Problem List   Diagnosis Date Noted  . Viral URI with cough 03/16/2014  . Herpes simplex 03/16/2014  . Rash and nonspecific skin eruption 09/22/2013  . Pedal edema 06/15/2013  . Acute blood loss anemia 06/15/2013  . Constipation 06/15/2013  . Fracture of humerus, proximal, left, closed 06/09/2013  . Parotid gland fullness 08/19/2012  . Encounter for Medicare annual wellness exam 06/17/2012  . Hypokalemia 06/17/2012  . Routine general medical examination at a health care facility 02/18/2011  . UNSPECIFIED VITAMIN D DEFICIENCY 07/30/2008  . DERMATOPHYTOSIS, NAIL 07/26/2006  . Diabetes type 2, controlled 07/25/2006  . HYPERCHOLESTEROLEMIA 07/25/2006  . Essential hypertension 07/25/2006  . OSTEOPENIA 07/25/2006   Past Medical History  Diagnosis Date  . Diabetes mellitus     type II  . Hypertension   . Osteopenia   . Early cataracts, bilateral     no sx yet   . Hyperlipidemia   .  Arthritis    Past Surgical History  Procedure Laterality Date  . Laparoscopic cholecystectomy    . Oophorectomy    . Cystoscopy  10-1999  . Fracture surgery  2010    rib fx- from fall   . Orif humerus fracture Left 06/09/2013    Procedure: OPEN REDUCTION INTERNAL FIXATION (ORIF) PROXIMAL HUMERUS FRACTURE;  Surgeon: Rozanna Box, MD;  Location: [place];  Service: Orthopedics;  Laterality: Left;   History  Substance Use Topics  . Smoking status: Never Smoker   . Smokeless tobacco: Never Used  . Alcohol Use: No   Family History  Problem Relation Age of Onset  . Heart attack Mother   . Depression Sister   . Heart attack Brother   . Stroke Brother   . Colon cancer Neg Hx   . Stomach cancer Neg Hx   . Esophageal cancer Neg Hx   . Rectal cancer Neg Hx    Allergies  Allergen Reactions  . Alendronate Sodium     REACTION: reaction not known  . Atorvastatin       REACTION: headaches  . Pravastatin Sodium     REACTION: headaches and arm pain   Current Outpatient Prescriptions on File Prior to Visit  Medication Sig Dispense Refill  . acyclovir ointment (ZOVIRAX) 5 % Apply 1 application topically every 3 (three) hours. To affected areas 30 g 1  . Ascorbic Acid (VITAMIN C) 1000 MG tablet Take 500 mg by mouth daily.     Marland Kitchen aspirin 81 MG EC tablet Take 81 mg by mouth daily.      . Biotin 5000 MCG TABS Take 5,000 mcg by mouth daily.     . Calcium Carbonate-Vit D-Min (CALCIUM 1200 PO) Take 1 tablet by mouth 2 (two) times daily.    . Cholecalciferol (VITAMIN D) 2000 UNITS CAPS Take 2,000 Units by mouth daily.     Marland Kitchen CRANBERRY PO Take 4,200 Units by mouth daily.    Marland Kitchen docusate sodium 100 MG CAPS Take 100 mg by mouth 2 (two) times daily. 20 capsule 1  . Omega-3 300 MG CAPS Take 300 mg by mouth 2 (two) times daily.     . vitamin B-12 (CYANOCOBALAMIN) 1000 MCG tablet Take 1,000 mcg by mouth daily.     No current facility-administered medications on file prior to visit.     Review of Systems Review of Systems  Constitutional: Negative for fever, appetite change, fatigue and unexpected weight change.  Eyes: Negative for pain and visual disturbance.  Respiratory: Negative for cough and shortness of breath.   Cardiovascular: Negative for cp or palpitations    Gastrointestinal: Negative for nausea, diarrhea and constipation.  Genitourinary: Negative for urgency and frequency.  Skin: Negative for pallor or rash   Neurological: Negative for weakness, light-headedness, numbness and headaches.  Hematological: Negative for adenopathy. Does not bruise/bleed easily.  Psychiatric/Behavioral: Negative for dysphoric mood. The patient is not nervous/anxious.         Objective:   Physical Exam  Constitutional: She appears well-developed and well-nourished. No distress.  obese and well appearing   HENT:  Head: Normocephalic and atraumatic.  Mouth/Throat:  Oropharynx is clear and moist.  Eyes: Conjunctivae and EOM are normal. Pupils are equal, round, and reactive to light.  Neck: Normal range of motion. Neck supple. No JVD present. Carotid bruit is not present. No thyromegaly present.  Cardiovascular: Normal rate, regular rhythm, normal heart sounds and intact distal pulses.  Exam reveals no gallop.   Pulmonary/Chest:  Effort normal and breath sounds normal. No respiratory distress. She has no wheezes. She has no rales.  No crackles  Abdominal: Soft. Bowel sounds are normal. She exhibits no distension, no abdominal bruit and no mass. There is no tenderness.  Musculoskeletal: She exhibits no edema.  Lymphadenopathy:    She has no cervical adenopathy.  Neurological: She is alert. She has normal reflexes.  Skin: Skin is warm and dry. No rash noted.  Psychiatric: She has a normal mood and affect.          Assessment & Plan:   Problem List Items Addressed This Visit      Cardiovascular and Mediastinum   Essential hypertension - Primary    bp in fair control at this time  BP Readings from Last 1 Encounters:  05/21/14 116/80   No changes needed Disc lifstyle change with low sodium diet and exercise  Labs reviewed meds refilled       Relevant Medications   gemfibrozil (LOPID) 600 MG tablet   lisinopril (PRINIVIL,ZESTRIL) 5 MG tablet   lovastatin (MEVACOR) 20 MG tablet   spironolactone (ALDACTONE) 25 MG tablet     Endocrine   Diabetes type 2, controlled    Lab Results  Component Value Date   HGBA1C 6.2 05/14/2014   Overall stable Rev diet/low glycemic She declines flu and pna vaccines  opth utd On ace F/u 29mo      Relevant Medications   lisinopril (PRINIVIL,ZESTRIL) 5 MG tablet   lovastatin (MEVACOR) 20 MG tablet   metFORMIN (GLUCOPHAGE) 500 MG tablet     Other   HYPERCHOLESTEROLEMIA    Disc goals for lipids and reasons to control them Rev labs with pt Rev low sat fat diet in detail Overall stable on lovastatin  and lopid        Relevant Medications   gemfibrozil (LOPID) 600 MG tablet   lisinopril (PRINIVIL,ZESTRIL) 5 MG tablet   lovastatin (MEVACOR) 20 MG tablet   spironolactone (ALDACTONE) 25 MG tablet

## 2014-08-05 ENCOUNTER — Telehealth: Payer: Self-pay

## 2014-08-05 NOTE — Telephone Encounter (Signed)
Patient called and spoke with Amy Fry. Patient states that she has already had a mammogram this year.

## 2014-08-05 NOTE — Telephone Encounter (Signed)
Called and left a message for patient about being due for a mammogram. Will await call back.

## 2014-08-16 LAB — HM DIABETES EYE EXAM

## 2014-08-17 ENCOUNTER — Encounter: Payer: Self-pay | Admitting: Family Medicine

## 2014-12-01 ENCOUNTER — Telehealth: Payer: Self-pay | Admitting: Family Medicine

## 2014-12-01 DIAGNOSIS — I10 Essential (primary) hypertension: Secondary | ICD-10-CM

## 2014-12-01 DIAGNOSIS — E559 Vitamin D deficiency, unspecified: Secondary | ICD-10-CM

## 2014-12-01 DIAGNOSIS — E119 Type 2 diabetes mellitus without complications: Secondary | ICD-10-CM

## 2014-12-01 DIAGNOSIS — E7800 Pure hypercholesterolemia, unspecified: Secondary | ICD-10-CM

## 2014-12-01 NOTE — Telephone Encounter (Signed)
-----   Message from Marchia Bond sent at 11/24/2014 11:11 AM EST ----- Regarding: Cpx labs Thurs 11/17 need orders, thanks :-) Please order  future cpx labs for pt's upcoming lab appt. Thanks Aniceto Boss

## 2014-12-02 ENCOUNTER — Other Ambulatory Visit (INDEPENDENT_AMBULATORY_CARE_PROVIDER_SITE_OTHER): Payer: PPO

## 2014-12-02 DIAGNOSIS — E119 Type 2 diabetes mellitus without complications: Secondary | ICD-10-CM | POA: Diagnosis not present

## 2014-12-02 DIAGNOSIS — I10 Essential (primary) hypertension: Secondary | ICD-10-CM | POA: Diagnosis not present

## 2014-12-02 DIAGNOSIS — E559 Vitamin D deficiency, unspecified: Secondary | ICD-10-CM | POA: Diagnosis not present

## 2014-12-02 DIAGNOSIS — E7800 Pure hypercholesterolemia, unspecified: Secondary | ICD-10-CM | POA: Diagnosis not present

## 2014-12-02 LAB — CBC WITH DIFFERENTIAL/PLATELET
BASOS ABS: 0 10*3/uL (ref 0.0–0.1)
Basophils Relative: 0.7 % (ref 0.0–3.0)
EOS PCT: 1.9 % (ref 0.0–5.0)
Eosinophils Absolute: 0.1 10*3/uL (ref 0.0–0.7)
HEMATOCRIT: 41.5 % (ref 36.0–46.0)
Hemoglobin: 13.9 g/dL (ref 12.0–15.0)
LYMPHS ABS: 1.8 10*3/uL (ref 0.7–4.0)
Lymphocytes Relative: 34.8 % (ref 12.0–46.0)
MCHC: 33.4 g/dL (ref 30.0–36.0)
MCV: 93.8 fl (ref 78.0–100.0)
Monocytes Absolute: 0.4 10*3/uL (ref 0.1–1.0)
Monocytes Relative: 8.4 % (ref 3.0–12.0)
NEUTROS PCT: 54.2 % (ref 43.0–77.0)
Neutro Abs: 2.9 10*3/uL (ref 1.4–7.7)
Platelets: 296 10*3/uL (ref 150.0–400.0)
RBC: 4.42 Mil/uL (ref 3.87–5.11)
RDW: 13.8 % (ref 11.5–15.5)
WBC: 5.3 10*3/uL (ref 4.0–10.5)

## 2014-12-02 LAB — LIPID PANEL
CHOL/HDL RATIO: 3
Cholesterol: 153 mg/dL (ref 0–200)
HDL: 58.4 mg/dL (ref >39.00)
LDL Cholesterol: 78 mg/dL (ref 0–99)
NonHDL: 94.51
TRIGLYCERIDES: 82 mg/dL (ref 0.0–149.0)
VLDL: 16.4 mg/dL (ref 0.0–40.0)

## 2014-12-02 LAB — TSH: TSH: 1.01 u[IU]/mL (ref 0.35–4.50)

## 2014-12-02 LAB — COMPREHENSIVE METABOLIC PANEL
ALK PHOS: 81 U/L (ref 39–117)
ALT: 17 U/L (ref 0–35)
AST: 19 U/L (ref 0–37)
Albumin: 4.5 g/dL (ref 3.5–5.2)
BILIRUBIN TOTAL: 0.6 mg/dL (ref 0.2–1.2)
BUN: 21 mg/dL (ref 6–23)
CALCIUM: 10.4 mg/dL (ref 8.4–10.5)
CO2: 27 mEq/L (ref 19–32)
Chloride: 103 mEq/L (ref 96–112)
Creatinine, Ser: 0.97 mg/dL (ref 0.40–1.20)
GFR: 60.68 mL/min (ref >60.00)
GLUCOSE: 122 mg/dL — AB (ref 70–99)
Potassium: 4.4 mEq/L (ref 3.5–5.1)
Sodium: 140 mEq/L (ref 135–145)
TOTAL PROTEIN: 7.4 g/dL (ref 6.0–8.3)

## 2014-12-02 LAB — HEMOGLOBIN A1C: Hgb A1c MFr Bld: 6.4 % (ref 4.6–6.5)

## 2014-12-02 LAB — VITAMIN D 25 HYDROXY (VIT D DEFICIENCY, FRACTURES): VITD: 70.77 ng/mL (ref 30.00–100.00)

## 2014-12-08 ENCOUNTER — Ambulatory Visit (INDEPENDENT_AMBULATORY_CARE_PROVIDER_SITE_OTHER): Payer: PPO | Admitting: Family Medicine

## 2014-12-08 ENCOUNTER — Encounter: Payer: Self-pay | Admitting: Family Medicine

## 2014-12-08 VITALS — BP 116/70 | HR 68 | Temp 97.5°F | Ht 63.0 in | Wt 187.8 lb

## 2014-12-08 DIAGNOSIS — E559 Vitamin D deficiency, unspecified: Secondary | ICD-10-CM | POA: Diagnosis not present

## 2014-12-08 DIAGNOSIS — I10 Essential (primary) hypertension: Secondary | ICD-10-CM | POA: Diagnosis not present

## 2014-12-08 DIAGNOSIS — Z0000 Encounter for general adult medical examination without abnormal findings: Secondary | ICD-10-CM

## 2014-12-08 DIAGNOSIS — E669 Obesity, unspecified: Secondary | ICD-10-CM

## 2014-12-08 DIAGNOSIS — E7800 Pure hypercholesterolemia, unspecified: Secondary | ICD-10-CM

## 2014-12-08 DIAGNOSIS — E119 Type 2 diabetes mellitus without complications: Secondary | ICD-10-CM

## 2014-12-08 DIAGNOSIS — M8580 Other specified disorders of bone density and structure, unspecified site: Secondary | ICD-10-CM

## 2014-12-08 NOTE — Progress Notes (Signed)
Pre visit review using our clinic review tool, if applicable. No additional management support is needed unless otherwise documented below in the visit note. 

## 2014-12-08 NOTE — Patient Instructions (Addendum)
Please look at the blue packet and start an Advance Directive - power of attorney and living will Labs are stable Keep watching sugar in your diet  Think about some extra exercise for bone health and weight loss and blood sugar control  Continue your calcium and vitamin D   Follow up in 6 months for diabetes visit with labs prior

## 2014-12-08 NOTE — Progress Notes (Signed)
Subjective:    Patient ID: Amy Fry, female    DOB: 05-08-1945, 69 y.o.   MRN: [id number]  HPI Here for annual medicare wellness visit as well as chronic/acute medical problems   I have personally reviewed the Medicare Annual Wellness questionnaire and have noted 1. The patient's medical and social history 2. Their use of alcohol, tobacco or illicit drugs 3. Their current medications and supplements 4. The patient's functional ability including ADL's, fall risks, home safety risks and hearing or visual             impairment. 5. Diet and physical activities 6. Evidence for depression or mood disorders  The patients weight, height, BMI have been recorded in the chart and visual acuity is per eye clinic.  I have made referrals, counseling and provided education to the patient based review of the above and I have provided the pt with a written personalized care plan for preventive services. Reviewed and updated provider list, see scanned forms.  Feeling fine  Taking care of herself   See scanned forms.  Routine anticipatory guidance given to patient.  See health maintenance. Colon cancer screening 3/14 - 5 year recall  Breast cancer screening 2/16 with gyn (also a pap which was ok)  Self breast exam-no lumps  Flu vaccine- declines  Tetanus vaccine 5/13 Pneumovax- declines  Zoster vaccine- declines  dexa -2016 with her gyn she thinks , stable osteopenia (pt recalls?)no new treatment, taking her ca and D D level is very good at 70  She is active but does not exercise , has not broken a bone since 2015, no falls in the past year  Advance directive - does not have living will or POA -will work on that (given packet0 Cognitive function addressed- see scanned forms- and if abnormal then additional documentation follows.  No concerns / does occ misplace things   PMH and SH reviewed  Meds, vitals, and allergies reviewed.   ROS: See HPI.  Otherwise negative.    DM2  Lab  Results  Component Value Date   HGBA1C 6.4 12/02/2014   Up from 6.2  On metformin Diet is not always optimal lately  She avoids sweet (does some sugar free), some starches  Declines DM education     bp is stable today  No cp or palpitations or headaches or edema  No side effects to medicines  BP Readings from Last 3 Encounters:  12/08/14 116/70  05/21/14 116/80  03/16/14 118/74     Cholesterol Lab Results  Component Value Date   CHOL 153 12/02/2014   CHOL 141 05/14/2014   CHOL 157 07/31/2013   Lab Results  Component Value Date   HDL 58.40 12/02/2014   HDL 53.40 05/14/2014   HDL 64.10 07/31/2013   Lab Results  Component Value Date   LDLCALC 78 12/02/2014   LDLCALC 68 05/14/2014   LDLCALC 75 07/31/2013   Lab Results  Component Value Date   TRIG 82.0 12/02/2014   TRIG 96.0 05/14/2014   TRIG 91.0 07/31/2013   Lab Results  Component Value Date   CHOLHDL 3 12/02/2014   CHOLHDL 3 05/14/2014   CHOLHDL 2 07/31/2013   No results found for: LDLDIRECT  On lovastatin and diet     Chemistry      Component Value Date/Time   NA 140 12/02/2014 0903   K 4.4 12/02/2014 0903   CL 103 12/02/2014 0903   CO2 27 12/02/2014 0903   BUN 21 12/02/2014 0903  CREATININE 0.97 12/02/2014 0903      Component Value Date/Time   CALCIUM 10.4 12/02/2014 0903   CALCIUM 8.9 06/11/2013 0615   ALKPHOS 81 12/02/2014 0903   AST 19 12/02/2014 0903   ALT 17 12/02/2014 0903   BILITOT 0.6 12/02/2014 [id number]      Lab Results  Component Value Date   TSH 1.01 12/02/2014    Patient Active Problem List   Diagnosis Date Noted  . Viral URI with cough 03/16/2014  . Herpes simplex 03/16/2014  . Rash and nonspecific skin eruption 09/22/2013  . Pedal edema 06/15/2013  . Acute blood loss anemia 06/15/2013  . Constipation 06/15/2013  . Fracture of humerus, proximal, left, closed 06/09/2013  . Parotid gland fullness 08/19/2012  . Encounter for Medicare annual wellness exam 06/17/2012  .  Hypokalemia 06/17/2012  . Routine general medical examination at a health care facility 02/18/2011  . Vitamin D deficiency 07/30/2008  . DERMATOPHYTOSIS, NAIL 07/26/2006  . Diabetes type 2, controlled ([place]) 07/25/2006  . HYPERCHOLESTEROLEMIA 07/25/2006  . Essential hypertension 07/25/2006  . Osteopenia 07/25/2006   Past Medical History  Diagnosis Date  . Diabetes mellitus     type II  . Hypertension   . Osteopenia   . Early cataracts, bilateral     no sx yet   . Hyperlipidemia   . Arthritis    Past Surgical History  Procedure Laterality Date  . Laparoscopic cholecystectomy    . Oophorectomy    . Cystoscopy  10-1999  . Fracture surgery  2010    rib fx- from fall   . Orif humerus fracture Left 06/09/2013    Procedure: OPEN REDUCTION INTERNAL FIXATION (ORIF) PROXIMAL HUMERUS FRACTURE;  Surgeon: Rozanna Box, MD;  Location: [facility];  Service: Orthopedics;  Laterality: Left;   Social History  Substance Use Topics  . Smoking status: Never Smoker   . Smokeless tobacco: Never Used  . Alcohol Use: No   Family History  Problem Relation Age of Onset  . Heart attack Mother   . Depression Sister   . Heart attack Brother   . Stroke Brother   . Colon cancer Neg Hx   . Stomach cancer Neg Hx   . Esophageal cancer Neg Hx   . Rectal cancer Neg Hx    Allergies  Allergen Reactions  . Alendronate Sodium     REACTION: reaction not known  . Atorvastatin     REACTION: headaches  . Pravastatin Sodium     REACTION: headaches and arm pain   Current Outpatient Prescriptions on File Prior to Visit  Medication Sig Dispense Refill  . acyclovir ointment (ZOVIRAX) 5 % Apply 1 application topically every 3 (three) hours. To affected areas 30 g 1  . Ascorbic Acid (VITAMIN C) 1000 MG tablet Take 500 mg by mouth daily.     Marland Kitchen aspirin 81 MG EC tablet Take 81 mg by mouth daily.      . Calcium Carbonate-Vit D-Min (CALCIUM 1200 PO) Take 1 tablet by mouth 2 (two) times daily.    . Cholecalciferol  (VITAMIN D) 2000 UNITS CAPS Take 2,000 Units by mouth daily.     Marland Kitchen CRANBERRY PO Take 4,200 Units by mouth daily.    Marland Kitchen docusate sodium 100 MG CAPS Take 100 mg by mouth 2 (two) times daily. 20 capsule 1  . gemfibrozil (LOPID) 600 MG tablet Take 1 tablet (600 mg total) by mouth 2 (two) times daily. 60 tablet 11  . lisinopril (PRINIVIL,ZESTRIL) 5 MG tablet  Take 1 tablet (5 mg total) by mouth daily. 30 tablet 11  . lovastatin (MEVACOR) 20 MG tablet Take 1 tablet (20 mg total) by mouth daily. 30 tablet 11  . metFORMIN (GLUCOPHAGE) 500 MG tablet Take 1 tablet (500 mg total) by mouth 2 (two) times daily with a meal. 60 tablet 11  . Omega-3 300 MG CAPS Take 300 mg by mouth 2 (two) times daily.     Marland Kitchen spironolactone (ALDACTONE) 25 MG tablet TAKE 1/2 A TABLET BY MOUTH DAILY 15 tablet 11  . vitamin B-12 (CYANOCOBALAMIN) 1000 MCG tablet Take 1,000 mcg by mouth daily.     No current facility-administered medications on file prior to visit.    Review of Systems Review of Systems  Constitutional: Negative for fever, appetite change, fatigue and unexpected weight change.  Eyes: Negative for pain and visual disturbance.  Respiratory: Negative for cough and shortness of breath.   Cardiovascular: Negative for cp or palpitations    Gastrointestinal: Negative for nausea, diarrhea and constipation.  Genitourinary: Negative for urgency and frequency.  Skin: Negative for pallor or rash   Neurological: Negative for weakness, light-headedness, numbness and headaches.  Hematological: Negative for adenopathy. Does not bruise/bleed easily.  Psychiatric/Behavioral: Negative for dysphoric mood. The patient is not nervous/anxious.         Objective:   Physical Exam  Constitutional: She appears well-developed and well-nourished. No distress.  obese and well appearing   HENT:  Head: Normocephalic and atraumatic.  Right Ear: External ear normal.  Left Ear: External ear normal.  Nose: Nose normal.  Mouth/Throat:  Oropharynx is clear and moist.  Eyes: Conjunctivae and EOM are normal. Pupils are equal, round, and reactive to light. Right eye exhibits no discharge. Left eye exhibits no discharge. No scleral icterus.  Neck: Normal range of motion. Neck supple. No JVD present. Carotid bruit is not present. No thyromegaly present.  Cardiovascular: Normal rate, regular rhythm, normal heart sounds and intact distal pulses.  Exam reveals no gallop.   Pulmonary/Chest: Effort normal and breath sounds normal. No respiratory distress. She has no wheezes. She has no rales.  Abdominal: Soft. Bowel sounds are normal. She exhibits no distension and no mass. There is no tenderness.  Musculoskeletal: She exhibits no edema or tenderness.  No kyphosis   Lymphadenopathy:    She has no cervical adenopathy.  Neurological: She is alert. She has normal reflexes. No cranial nerve deficit. She exhibits normal muscle tone. Coordination normal.  Skin: Skin is warm and dry. No rash noted. No erythema. No pallor.  Solar lentigo and SKs noted   Psychiatric: She has a normal mood and affect.          Assessment & Plan:   Problem List Items Addressed This Visit      Cardiovascular and Mediastinum   Essential hypertension - Primary    bp in fair control at this time  BP Readings from Last 1 Encounters:  12/08/14 116/70   No changes needed Disc lifstyle change with low sodium diet and exercise  Labs reviewed         Endocrine   Diabetes type 2, controlled ([place])    Lab Results  Component Value Date   HGBA1C 6.4 12/02/2014   This is up slightly Disc imp of low glycemic diet/ wt loss and exercise for opt control  Continue metformin  F/iu 43mo        Musculoskeletal and Integument   Osteopenia    Pt states utd from this year-  managed by her gyn  Last fx was humerous fx  None in the past year Disc need for calcium/ vitamin D/ wt bearing exercise and bone density test every 2 y to monitor Disc safety/ fracture  risk in detail          Other   Encounter for Medicare annual wellness exam    Reviewed health habits including diet and exercise and skin cancer prevention Reviewed appropriate screening tests for age  Also reviewed health mt list, fam hx and immunization status , as well as social and family history   See HPI Labs reviewed  Pt declines some immunizations- counseled  Please look at the blue packet and start an Advance Directive - power of attorney and living will Labs are stable Keep watching sugar in your diet  Think about some extra exercise for bone health and weight loss and blood sugar control  Continue your calcium and vitamin D       HYPERCHOLESTEROLEMIA    Disc goals for lipids and reasons to control them Rev labs with pt Rev low sat fat diet in detail  Managed with lovastatin and diet       Vitamin D deficiency    Vitamin D level is therapeutic with current supplementation Disc importance of this to bone and overall health  Level of 70

## 2014-12-11 DIAGNOSIS — E669 Obesity, unspecified: Secondary | ICD-10-CM | POA: Insufficient documentation

## 2014-12-11 NOTE — Assessment & Plan Note (Signed)
bp in fair control at this time  BP Readings from Last 1 Encounters:  12/08/14 116/70   No changes needed Disc lifstyle change with low sodium diet and exercise  Labs reviewed

## 2014-12-11 NOTE — Assessment & Plan Note (Signed)
Vitamin D level is therapeutic with current supplementation Disc importance of this to bone and overall health  Level of 70 

## 2014-12-11 NOTE — Assessment & Plan Note (Addendum)
Reviewed health habits including diet and exercise and skin cancer prevention Reviewed appropriate screening tests for age  Also reviewed health mt list, fam hx and immunization status , as well as social and family history   See HPI Labs reviewed  Pt declines some immunizations- counseled  Please look at the blue packet and start an Advance Directive - power of attorney and living will Labs are stable Keep watching sugar in your diet  Think about some extra exercise for bone health and weight loss and blood sugar control  Continue your calcium and vitamin D

## 2014-12-11 NOTE — Assessment & Plan Note (Signed)
Discussed how this problem influences overall health and the risks it imposes  Reviewed plan for weight loss with lower calorie diet (via better food choices and also portion control or program like weight watchers) and exercise building up to or more than 30 minutes 5 days per week including some aerobic activity    

## 2014-12-11 NOTE — Assessment & Plan Note (Signed)
Lab Results  Component Value Date   HGBA1C 6.4 12/02/2014   This is up slightly Disc imp of low glycemic diet/ wt loss and exercise for opt control  Continue metformin  F/iu 35mo

## 2014-12-11 NOTE — Assessment & Plan Note (Signed)
Disc goals for lipids and reasons to control them Rev labs with pt Rev low sat fat diet in detail  Managed with lovastatin and diet

## 2014-12-11 NOTE — Assessment & Plan Note (Addendum)
Pt states utd from this year- managed by her gyn  Last fx was humerous fx  None in the past year Disc need for calcium/ vitamin D/ wt bearing exercise and bone density test every 2 y to monitor Disc safety/ fracture risk in detail

## 2015-02-14 DIAGNOSIS — L570 Actinic keratosis: Secondary | ICD-10-CM | POA: Diagnosis not present

## 2015-02-14 DIAGNOSIS — L905 Scar conditions and fibrosis of skin: Secondary | ICD-10-CM | POA: Diagnosis not present

## 2015-02-17 LAB — HM MAMMOGRAPHY: HM MAMMO: NORMAL (ref 0–4)

## 2015-05-28 ENCOUNTER — Telehealth: Payer: Self-pay | Admitting: Family Medicine

## 2015-05-28 DIAGNOSIS — E119 Type 2 diabetes mellitus without complications: Secondary | ICD-10-CM

## 2015-05-28 DIAGNOSIS — E7800 Pure hypercholesterolemia, unspecified: Secondary | ICD-10-CM

## 2015-05-28 DIAGNOSIS — I10 Essential (primary) hypertension: Secondary | ICD-10-CM

## 2015-05-28 NOTE — Telephone Encounter (Signed)
-----   Message from Ellamae Sia sent at 05/24/2015  4:37 PM EDT ----- Regarding: Lab orders for Wednesday, 5.17.17 Lab orders for a 6 month follow up appt

## 2015-05-30 DIAGNOSIS — Z1231 Encounter for screening mammogram for malignant neoplasm of breast: Secondary | ICD-10-CM | POA: Diagnosis not present

## 2015-05-30 DIAGNOSIS — M816 Localized osteoporosis [Lequesne]: Secondary | ICD-10-CM | POA: Diagnosis not present

## 2015-05-30 DIAGNOSIS — Z6832 Body mass index (BMI) 32.0-32.9, adult: Secondary | ICD-10-CM | POA: Diagnosis not present

## 2015-05-30 DIAGNOSIS — N958 Other specified menopausal and perimenopausal disorders: Secondary | ICD-10-CM | POA: Diagnosis not present

## 2015-05-30 DIAGNOSIS — Z01419 Encounter for gynecological examination (general) (routine) without abnormal findings: Secondary | ICD-10-CM | POA: Diagnosis not present

## 2015-06-01 ENCOUNTER — Other Ambulatory Visit (INDEPENDENT_AMBULATORY_CARE_PROVIDER_SITE_OTHER): Payer: PPO

## 2015-06-01 DIAGNOSIS — E7800 Pure hypercholesterolemia, unspecified: Secondary | ICD-10-CM | POA: Diagnosis not present

## 2015-06-01 DIAGNOSIS — E119 Type 2 diabetes mellitus without complications: Secondary | ICD-10-CM

## 2015-06-01 DIAGNOSIS — I10 Essential (primary) hypertension: Secondary | ICD-10-CM

## 2015-06-01 LAB — COMPREHENSIVE METABOLIC PANEL
ALK PHOS: 73 U/L (ref 39–117)
ALT: 14 U/L (ref 0–35)
AST: 17 U/L (ref 0–37)
Albumin: 4.5 g/dL (ref 3.5–5.2)
BUN: 23 mg/dL (ref 6–23)
CO2: 29 meq/L (ref 19–32)
Calcium: 10 mg/dL (ref 8.4–10.5)
Chloride: 106 mEq/L (ref 96–112)
Creatinine, Ser: 0.85 mg/dL (ref 0.40–1.20)
GFR: 70.56 mL/min (ref >60.00)
GLUCOSE: 112 mg/dL — AB (ref 70–99)
POTASSIUM: 4.7 meq/L (ref 3.5–5.1)
SODIUM: 139 meq/L (ref 135–145)
TOTAL PROTEIN: 7.1 g/dL (ref 6.0–8.3)
Total Bilirubin: 0.4 mg/dL (ref 0.2–1.2)

## 2015-06-01 LAB — LIPID PANEL
CHOL/HDL RATIO: 3
Cholesterol: 161 mg/dL (ref 0–200)
HDL: 50.9 mg/dL (ref >39.00)
LDL CALC: 90 mg/dL (ref 0–99)
NONHDL: 110.04
TRIGLYCERIDES: 101 mg/dL (ref 0.0–149.0)
VLDL: 20.2 mg/dL (ref 0.0–40.0)

## 2015-06-01 LAB — HEMOGLOBIN A1C: Hgb A1c MFr Bld: 6.4 % (ref 4.6–6.5)

## 2015-06-08 ENCOUNTER — Encounter: Payer: Self-pay | Admitting: Family Medicine

## 2015-06-08 ENCOUNTER — Ambulatory Visit (INDEPENDENT_AMBULATORY_CARE_PROVIDER_SITE_OTHER): Payer: PPO | Admitting: Family Medicine

## 2015-06-08 VITALS — BP 128/75 | HR 70 | Temp 97.8°F | Ht 63.0 in | Wt 189.8 lb

## 2015-06-08 DIAGNOSIS — E119 Type 2 diabetes mellitus without complications: Secondary | ICD-10-CM

## 2015-06-08 DIAGNOSIS — I10 Essential (primary) hypertension: Secondary | ICD-10-CM

## 2015-06-08 DIAGNOSIS — E669 Obesity, unspecified: Secondary | ICD-10-CM | POA: Diagnosis not present

## 2015-06-08 DIAGNOSIS — E7800 Pure hypercholesterolemia, unspecified: Secondary | ICD-10-CM

## 2015-06-08 MED ORDER — LISINOPRIL 5 MG PO TABS: 5.0000 mg | ORAL_TABLET | Freq: Every day | ORAL | Status: DC

## 2015-06-08 MED ORDER — METFORMIN HCL 500 MG PO TABS: 500.0000 mg | ORAL_TABLET | Freq: Two times a day (BID) | ORAL | Status: DC

## 2015-06-08 MED ORDER — SPIRONOLACTONE 25 MG PO TABS: ORAL_TABLET | ORAL | Status: DC

## 2015-06-08 MED ORDER — LOVASTATIN 20 MG PO TABS: 20.0000 mg | ORAL_TABLET | Freq: Every day | ORAL | Status: DC

## 2015-06-08 MED ORDER — GEMFIBROZIL 600 MG PO TABS: 600.0000 mg | ORAL_TABLET | Freq: Two times a day (BID) | ORAL | Status: DC

## 2015-06-08 NOTE — Patient Instructions (Signed)
For cholesterol---- Avoid red meat/ fried foods/ egg yolks/ fatty breakfast meats/ butter, cheese and high fat dairy/ and shellfish   Glucose control is stable  Work on [employer] loss/exercise / self care  Follow up in 6 months for annual exam

## 2015-06-08 NOTE — Assessment & Plan Note (Signed)
Lab Results  Component Value Date   HGBA1C 6.4 06/01/2015   This is stable Metformin and diet  Enc wt loss Rev low glycemic diet  F/u 6 mo

## 2015-06-08 NOTE — Assessment & Plan Note (Signed)
Discussed how this problem influences overall health and the risks it imposes  Reviewed plan for weight loss with lower calorie diet (via better food choices and also portion control or program like weight watchers) and exercise building up to or more than 30 minutes 5 days per week including some aerobic activity    

## 2015-06-08 NOTE — Progress Notes (Signed)
Pre visit review using our clinic review tool, if applicable. No additional management support is needed unless otherwise documented below in the visit note. 

## 2015-06-08 NOTE — Assessment & Plan Note (Signed)
bp in fair control at this time  BP Readings from Last 1 Encounters:  06/08/15 128/75   No changes needed Disc lifstyle change with low sodium diet and exercise  Labs reviewed Enc wt loss

## 2015-06-08 NOTE — Assessment & Plan Note (Signed)
Disc goals for lipids and reasons to control them Rev labs with pt Rev low sat fat diet in detail HDL is down and LDL is up slightly-ratio unchanged Will work harder on diet  F/u 6 mo

## 2015-06-08 NOTE — Progress Notes (Signed)
Subjective:    Patient ID: Amy Fry, female    DOB: 1945-09-18, 70 y.o.   MRN: [id number]  HPI Here for f/u of chronic health problems   Has been feeling ok overall  Is taking care of herself  Wt is up 2 lb with bmi of 33  Exercise - yard work  Eating fair - following diabetic diet   bp is stable today  No cp or palpitations or headaches or edema  No side effects to medicines  BP Readings from Last 3 Encounters:  06/08/15 130/78  12/08/14 116/70  05/21/14 116/80      Diabetes Home sugar results  DM diet - doing fairly well with that  Exercise - yard work / ? If she could fit in some more  Symptoms-none  A1C last  Lab Results  Component Value Date   HGBA1C 6.4 06/01/2015  this is stable from last check   No problems with medications -no problems with metformin  Renal protection- ace  Last eye exam 8/16  Hyperlipidemia Lab Results  Component Value Date   CHOL 161 06/01/2015   CHOL 153 12/02/2014   CHOL 141 05/14/2014   Lab Results  Component Value Date   HDL 50.90 06/01/2015   HDL 58.40 12/02/2014   HDL 53.40 05/14/2014   Lab Results  Component Value Date   LDLCALC 90 06/01/2015   LDLCALC 78 12/02/2014   LDLCALC 68 05/14/2014   Lab Results  Component Value Date   TRIG 101.0 06/01/2015   TRIG 82.0 12/02/2014   TRIG 96.0 05/14/2014   Lab Results  Component Value Date   CHOLHDL 3 06/01/2015   CHOLHDL 3 12/02/2014   CHOLHDL 3 05/14/2014   No results found for: LDLDIRECT  On lovastatin and diet  Eats some fried foods - too often     Patient Active Problem List   Diagnosis Date Noted  . Obesity 12/11/2014  . Herpes simplex 03/16/2014  . Rash and nonspecific skin eruption 09/22/2013  . Pedal edema 06/15/2013  . Acute blood loss anemia 06/15/2013  . Constipation 06/15/2013  . History of fracture of arm 06/09/2013  . Encounter for Medicare annual wellness exam 06/17/2012  . Hypokalemia 06/17/2012  . Routine general medical  examination at a health care facility 02/18/2011  . Vitamin D deficiency 07/30/2008  . DERMATOPHYTOSIS, NAIL 07/26/2006  . Diabetes type 2, controlled ([place]) 07/25/2006  . HYPERCHOLESTEROLEMIA 07/25/2006  . Essential hypertension 07/25/2006  . Osteopenia 07/25/2006   Past Medical History  Diagnosis Date  . Diabetes mellitus     type II  . Hypertension   . Osteopenia   . Early cataracts, bilateral     no sx yet   . Hyperlipidemia   . Arthritis    Past Surgical History  Procedure Laterality Date  . Laparoscopic cholecystectomy    . Oophorectomy    . Cystoscopy  10-1999  . Fracture surgery  2010    rib fx- from fall   . Orif humerus fracture Left 06/09/2013    Procedure: OPEN REDUCTION INTERNAL FIXATION (ORIF) PROXIMAL HUMERUS FRACTURE;  Surgeon: Rozanna Box, MD;  Location: [facility];  Service: Orthopedics;  Laterality: Left;   Social History  Substance Use Topics  . Smoking status: Never Smoker   . Smokeless tobacco: Never Used  . Alcohol Use: No   Family History  Problem Relation Age of Onset  . Heart attack Mother   . Depression Sister   . Heart attack Brother   .  Stroke Brother   . Colon cancer Neg Hx   . Stomach cancer Neg Hx   . Esophageal cancer Neg Hx   . Rectal cancer Neg Hx    Allergies  Allergen Reactions  . Alendronate Sodium     REACTION: reaction not known  . Atorvastatin     REACTION: headaches  . Pravastatin Sodium     REACTION: headaches and arm pain   Current Outpatient Prescriptions on File Prior to Visit  Medication Sig Dispense Refill  . acyclovir ointment (ZOVIRAX) 5 % Apply 1 application topically every 3 (three) hours. To affected areas 30 g 1  . Ascorbic Acid (VITAMIN C) 1000 MG tablet Take 500 mg by mouth daily.     Marland Kitchen aspirin 81 MG EC tablet Take 81 mg by mouth daily.      . Calcium Carbonate-Vit D-Min (CALCIUM 1200 PO) Take 1 tablet by mouth 2 (two) times daily.    . Cholecalciferol (VITAMIN D) 2000 UNITS CAPS Take 2,000 Units by  mouth daily.     Marland Kitchen CRANBERRY PO Take 4,200 Units by mouth daily.    Marland Kitchen docusate sodium 100 MG CAPS Take 100 mg by mouth 2 (two) times daily. 20 capsule 1  . Omega-3 300 MG CAPS Take 300 mg by mouth 2 (two) times daily.     . vitamin B-12 (CYANOCOBALAMIN) 1000 MCG tablet Take 1,000 mcg by mouth daily.     No current facility-administered medications on file prior to visit.    Review of Systems Review of Systems  Constitutional: Negative for fever, appetite change,  and unexpected weight change. pos for occ fatigue Eyes: Negative for pain and visual disturbance.  Respiratory: Negative for cough and shortness of breath.   Cardiovascular: Negative for cp or palpitations    Gastrointestinal: Negative for nausea, diarrhea and constipation.  Genitourinary: Negative for urgency and frequency.  Skin: Negative for pallor or rash   Neurological: Negative for weakness, light-headedness, numbness and headaches.  Hematological: Negative for adenopathy. Does not bruise/bleed easily.  Psychiatric/Behavioral: Negative for dysphoric mood. The patient is not nervous/anxious.         Objective:   Physical Exam  Constitutional: She appears well-developed and well-nourished. No distress.  obese and well appearing   HENT:  Head: Normocephalic and atraumatic.  Mouth/Throat: Oropharynx is clear and moist.  Eyes: Conjunctivae and EOM are normal. Pupils are equal, round, and reactive to light.  Neck: Normal range of motion. Neck supple. No JVD present. Carotid bruit is not present. No thyromegaly present.  Cardiovascular: Normal rate, regular rhythm, normal heart sounds and intact distal pulses.  Exam reveals no gallop.   Pulmonary/Chest: Effort normal and breath sounds normal. No respiratory distress. She has no wheezes. She has no rales.  No crackles  Abdominal: Soft. Bowel sounds are normal. She exhibits no distension, no abdominal bruit and no mass. There is no tenderness.  Musculoskeletal: She exhibits  no edema.  Lymphadenopathy:    She has no cervical adenopathy.  Neurological: She is alert. She has normal reflexes.  Skin: Skin is warm and dry. No rash noted.  Psychiatric: She has a normal mood and affect.          Assessment & Plan:   Problem List Items Addressed This Visit      Cardiovascular and Mediastinum   Essential hypertension - Primary    bp in fair control at this time  BP Readings from Last 1 Encounters:  06/08/15 128/75   No changes needed  Disc lifstyle change with low sodium diet and exercise  Labs reviewed Enc wt loss      Relevant Medications   spironolactone (ALDACTONE) 25 MG tablet   lovastatin (MEVACOR) 20 MG tablet   gemfibrozil (LOPID) 600 MG tablet   lisinopril (PRINIVIL,ZESTRIL) 5 MG tablet     Endocrine   Diabetes type 2, controlled ([place])    Lab Results  Component Value Date   HGBA1C 6.4 06/01/2015   This is stable Metformin and diet  Enc wt loss Rev low glycemic diet  F/u 6 mo       Relevant Medications   metFORMIN (GLUCOPHAGE) 500 MG tablet   lovastatin (MEVACOR) 20 MG tablet   lisinopril (PRINIVIL,ZESTRIL) 5 MG tablet     Other   Obesity    Discussed how this problem influences overall health and the risks it imposes  Reviewed plan for weight loss with lower calorie diet (via better food choices and also portion control or program like weight watchers) and exercise building up to or more than 30 minutes 5 days per week including some aerobic activity          Relevant Medications   metFORMIN (GLUCOPHAGE) 500 MG tablet   HYPERCHOLESTEROLEMIA    Disc goals for lipids and reasons to control them Rev labs with pt Rev low sat fat diet in detail HDL is down and LDL is up slightly-ratio unchanged Will work harder on diet  F/u 6 mo      Relevant Medications   spironolactone (ALDACTONE) 25 MG tablet   lovastatin (MEVACOR) 20 MG tablet   gemfibrozil (LOPID) 600 MG tablet   lisinopril (PRINIVIL,ZESTRIL) 5 MG tablet

## 2015-06-09 DIAGNOSIS — C4491 Basal cell carcinoma of skin, unspecified: Secondary | ICD-10-CM

## 2015-06-09 DIAGNOSIS — D485 Neoplasm of uncertain behavior of skin: Secondary | ICD-10-CM | POA: Diagnosis not present

## 2015-06-09 DIAGNOSIS — L578 Other skin changes due to chronic exposure to nonionizing radiation: Secondary | ICD-10-CM | POA: Diagnosis not present

## 2015-06-09 DIAGNOSIS — C44311 Basal cell carcinoma of skin of nose: Secondary | ICD-10-CM | POA: Diagnosis not present

## 2015-06-09 HISTORY — DX: Basal cell carcinoma of skin, unspecified: C44.91

## 2015-08-22 DIAGNOSIS — E119 Type 2 diabetes mellitus without complications: Secondary | ICD-10-CM | POA: Diagnosis not present

## 2015-08-22 DIAGNOSIS — H2513 Age-related nuclear cataract, bilateral: Secondary | ICD-10-CM | POA: Diagnosis not present

## 2015-08-22 DIAGNOSIS — Z0100 Encounter for examination of eyes and vision without abnormal findings: Secondary | ICD-10-CM | POA: Diagnosis not present

## 2015-08-22 LAB — HM DIABETES EYE EXAM

## 2015-09-12 DIAGNOSIS — L578 Other skin changes due to chronic exposure to nonionizing radiation: Secondary | ICD-10-CM | POA: Diagnosis not present

## 2015-09-12 DIAGNOSIS — Z85828 Personal history of other malignant neoplasm of skin: Secondary | ICD-10-CM | POA: Diagnosis not present

## 2015-09-12 DIAGNOSIS — L821 Other seborrheic keratosis: Secondary | ICD-10-CM | POA: Diagnosis not present

## 2015-09-12 DIAGNOSIS — L820 Inflamed seborrheic keratosis: Secondary | ICD-10-CM | POA: Diagnosis not present

## 2015-12-12 ENCOUNTER — Ambulatory Visit: Payer: PPO

## 2015-12-12 ENCOUNTER — Other Ambulatory Visit: Payer: PPO

## 2015-12-13 ENCOUNTER — Telehealth: Payer: Self-pay | Admitting: Family Medicine

## 2015-12-13 DIAGNOSIS — E119 Type 2 diabetes mellitus without complications: Secondary | ICD-10-CM

## 2015-12-13 DIAGNOSIS — Z1159 Encounter for screening for other viral diseases: Secondary | ICD-10-CM | POA: Insufficient documentation

## 2015-12-13 DIAGNOSIS — E559 Vitamin D deficiency, unspecified: Secondary | ICD-10-CM

## 2015-12-13 DIAGNOSIS — Z0000 Encounter for general adult medical examination without abnormal findings: Secondary | ICD-10-CM

## 2015-12-13 NOTE — Telephone Encounter (Signed)
-----   Message from Eustace Pen, LPN sent at [id number]  4:14 PM EST ----- Regarding: Lab Orders 12/14/15 Please add Hep C to lab orders, if any. Thank you.

## 2015-12-13 NOTE — Telephone Encounter (Signed)
Future labs 

## 2015-12-14 ENCOUNTER — Ambulatory Visit (INDEPENDENT_AMBULATORY_CARE_PROVIDER_SITE_OTHER): Payer: PPO

## 2015-12-14 VITALS — BP 120/72 | HR 65 | Temp 98.1°F | Ht 63.0 in | Wt 184.5 lb

## 2015-12-14 DIAGNOSIS — Z1159 Encounter for screening for other viral diseases: Secondary | ICD-10-CM

## 2015-12-14 DIAGNOSIS — Z0000 Encounter for general adult medical examination without abnormal findings: Secondary | ICD-10-CM

## 2015-12-14 DIAGNOSIS — E119 Type 2 diabetes mellitus without complications: Secondary | ICD-10-CM | POA: Diagnosis not present

## 2015-12-14 DIAGNOSIS — E559 Vitamin D deficiency, unspecified: Secondary | ICD-10-CM | POA: Diagnosis not present

## 2015-12-14 LAB — CBC WITH DIFFERENTIAL/PLATELET
BASOS ABS: 0 10*3/uL (ref 0.0–0.1)
Basophils Relative: 0.5 % (ref 0.0–3.0)
Eosinophils Absolute: 0.1 10*3/uL (ref 0.0–0.7)
Eosinophils Relative: 2.3 % (ref 0.0–5.0)
HEMATOCRIT: 41.9 % (ref 36.0–46.0)
HEMOGLOBIN: 14.4 g/dL (ref 12.0–15.0)
LYMPHS PCT: 41.3 % (ref 12.0–46.0)
Lymphs Abs: 2 10*3/uL (ref 0.7–4.0)
MCHC: 34.3 g/dL (ref 30.0–36.0)
MCV: 91.8 fl (ref 78.0–100.0)
MONOS PCT: 8.7 % (ref 3.0–12.0)
Monocytes Absolute: 0.4 10*3/uL (ref 0.1–1.0)
Neutro Abs: 2.3 10*3/uL (ref 1.4–7.7)
Neutrophils Relative %: 47.2 % (ref 43.0–77.0)
Platelets: 310 10*3/uL (ref 150.0–400.0)
RBC: 4.57 Mil/uL (ref 3.87–5.11)
RDW: 14.1 % (ref 11.5–15.5)
WBC: 4.9 10*3/uL (ref 4.0–10.5)

## 2015-12-14 LAB — COMPREHENSIVE METABOLIC PANEL
ALT: 15 U/L (ref 0–35)
AST: 18 U/L (ref 0–37)
Albumin: 4.7 g/dL (ref 3.5–5.2)
Alkaline Phosphatase: 80 U/L (ref 39–117)
BILIRUBIN TOTAL: 0.5 mg/dL (ref 0.2–1.2)
BUN: 22 mg/dL (ref 6–23)
CO2: 29 meq/L (ref 19–32)
CREATININE: 0.98 mg/dL (ref 0.40–1.20)
Calcium: 10.2 mg/dL (ref 8.4–10.5)
Chloride: 102 mEq/L (ref 96–112)
GFR: 59.78 mL/min — ABNORMAL LOW (ref >60.00)
GLUCOSE: 108 mg/dL — AB (ref 70–99)
Potassium: 4.6 mEq/L (ref 3.5–5.1)
SODIUM: 139 meq/L (ref 135–145)
Total Protein: 7.6 g/dL (ref 6.0–8.3)

## 2015-12-14 LAB — HEMOGLOBIN A1C: Hgb A1c MFr Bld: 6.6 % — ABNORMAL HIGH (ref 4.6–6.5)

## 2015-12-14 LAB — LIPID PANEL
CHOL/HDL RATIO: 3
Cholesterol: 163 mg/dL (ref 0–200)
HDL: 62.4 mg/dL (ref >39.00)
LDL CALC: 78 mg/dL (ref 0–99)
NonHDL: 100.48
Triglycerides: 113 mg/dL (ref 0.0–149.0)
VLDL: 22.6 mg/dL (ref 0.0–40.0)

## 2015-12-14 LAB — TSH: TSH: 0.91 u[IU]/mL (ref 0.35–4.50)

## 2015-12-14 LAB — VITAMIN D 25 HYDROXY (VIT D DEFICIENCY, FRACTURES): VITD: 60.99 ng/mL (ref 30.00–100.00)

## 2015-12-14 NOTE — Progress Notes (Signed)
Subjective:   Amy Fry is a 70 y.o. female who presents for Medicare Annual (Subsequent) preventive examination.  Review of Systems:  N/A Cardiac Risk Factors include: advanced age (>69men, >31 women);dyslipidemia;diabetes mellitus;hypertension;obesity (BMI >30kg/m2)     Objective:     Vitals: BP 120/72 (BP Location: Left Arm, Patient Position: Sitting, Cuff Size: Normal)   Pulse 65   Temp 98.1 F (36.7 C) (Oral)   Ht 5\' 3"  (1.6 m)   Wt 184 lb 8 oz (83.7 kg)   SpO2 98%   BMI 32.68 kg/m   Body mass index is 32.68 kg/m.   Tobacco History  Smoking Status  . Never Smoker  Smokeless Tobacco  . Never Used     Counseling given: No   Past Medical History:  Diagnosis Date  . Arthritis   . Diabetes mellitus    type II  . Early cataracts, bilateral    no sx yet   . Hyperlipidemia   . Hypertension   . Osteopenia    Past Surgical History:  Procedure Laterality Date  . CYSTOSCOPY  10-1999  . FRACTURE SURGERY  2010   rib fx- from fall   . LAPAROSCOPIC CHOLECYSTECTOMY    . OOPHORECTOMY    . ORIF HUMERUS FRACTURE Left 06/09/2013   Procedure: OPEN REDUCTION INTERNAL FIXATION (ORIF) PROXIMAL HUMERUS FRACTURE;  Surgeon: Rozanna Box, MD;  Location: [place];  Service: Orthopedics;  Laterality: Left;   Family History  Problem Relation Age of Onset  . Heart attack Mother   . Heart attack Brother   . Stroke Brother   . Depression Sister   . Colon cancer Neg Hx   . Stomach cancer Neg Hx   . Esophageal cancer Neg Hx   . Rectal cancer Neg Hx    History  Sexual Activity  . Sexual activity: No    Outpatient Encounter Prescriptions as of 12/14/2015  Medication Sig  . acyclovir ointment (ZOVIRAX) 5 % Apply 1 application topically every 3 (three) hours. To affected areas  . Ascorbic Acid (VITAMIN C) 1000 MG tablet Take 500 mg by mouth daily.   Marland Kitchen aspirin 81 MG EC tablet Take 81 mg by mouth daily.    . Calcium Carbonate-Vit D-Min (CALCIUM 1200 PO) Take 1 tablet by  mouth 2 (two) times daily.  . Cholecalciferol (VITAMIN D) 2000 UNITS CAPS Take 2,000 Units by mouth daily.   Marland Kitchen CRANBERRY PO Take 4,200 Units by mouth daily.  Marland Kitchen docusate sodium 100 MG CAPS Take 100 mg by mouth 2 (two) times daily.  Marland Kitchen gemfibrozil (LOPID) 600 MG tablet Take 1 tablet (600 mg total) by mouth 2 (two) times daily.  Marland Kitchen lisinopril (PRINIVIL,ZESTRIL) 5 MG tablet Take 1 tablet (5 mg total) by mouth daily.  Marland Kitchen lovastatin (MEVACOR) 20 MG tablet Take 1 tablet (20 mg total) by mouth daily.  . metFORMIN (GLUCOPHAGE) 500 MG tablet Take 1 tablet (500 mg total) by mouth 2 (two) times daily with a meal.  . Omega-3 300 MG CAPS Take 300 mg by mouth 2 (two) times daily.   Marland Kitchen spironolactone (ALDACTONE) 25 MG tablet TAKE 1/2 A TABLET BY MOUTH DAILY  . vitamin B-12 (CYANOCOBALAMIN) 1000 MCG tablet Take 1,000 mcg by mouth daily.   No facility-administered encounter medications on file as of 12/14/2015.     Activities of Daily Living In your present state of health, do you have any difficulty performing the following activities: 12/14/2015  Hearing? N  Vision? N  Difficulty concentrating or  making decisions? N  Walking or climbing stairs? N  Dressing or bathing? N  Doing errands, shopping? N  Preparing Food and eating ? N  Using the Toilet? N  In the past six months, have you accidently leaked urine? N  Do you have problems with loss of bowel control? N  Managing your Medications? N  Managing your Finances? N  Housekeeping or managing your Housekeeping? N  Some recent data might be hidden    Patient Care Team: Abner Greenspan, MD as PCP - General Jola Schmidt, MD as Consulting Physician (Ophthalmology) Ralene Bathe, MD as Consulting Physician (Dermatology) Arvella Nigh, MD as Consulting Physician (Obstetrics and Gynecology)    Assessment:     Hearing Screening   125Hz  250Hz  500Hz  1000Hz  2000Hz  3000Hz  4000Hz  6000Hz  8000Hz   Right ear:   40 40 40  40    Left ear:   40 40 40  40      Vision Screening Comments: Last vision exam in June 2017 with Dr. Valetta Close   Exercise Activities and Dietary recommendations Current Exercise Habits: The patient does not participate in regular exercise at present, Exercise limited by: None identified  Goals    . Increase water intake          Starting 12/14/2015, I will attempt to drink at least 8 oz water with each meal daily.       Fall Risk Fall Risk  12/14/2015 12/08/2014 06/17/2012  Falls in the past year? No No No   Depression Screen PHQ 2/9 Scores 12/14/2015 12/08/2014 06/17/2012  PHQ - 2 Score 0 0 0     Cognitive Function MMSE - Mini Mental State Exam 12/14/2015  Orientation to time 5  Orientation to Place 5  Registration 3  Attention/ Calculation 0  Recall 3  Language- name 2 objects 0  Language- repeat 1  Language- follow 3 step command 3  Language- read & follow direction 0  Write a sentence 0  Copy design 0  Total score 20       PLEASE NOTE: A Mini-Cog screen was completed. Maximum score is 20. A value of 0 denotes this part of Folstein MMSE was not completed or the patient failed this part of the Mini-Cog screening.   Mini-Cog Screening Orientation to Time - Max 5 pts Orientation to Place - Max 5 pts Registration - Max 3 pts Recall - Max 3 pts Language Repeat - Max 1 pts Language Follow 3 Step Command - Max 3 pts   Immunization History  Administered Date(s) Administered  . Tdap 06/05/2011   Screening Tests Health Maintenance  Topic Date Due  . PNA vac Low Risk Adult (1 of 2 - PCV13) 02/17/2020 (Originally 10/18/2011)  . INFLUENZA VACCINE  12/13/2025 (Originally 08/16/2015)  . ZOSTAVAX  12/13/2025 (Originally 10/18/2006)  . MAMMOGRAM  02/17/2016  . FOOT EXAM  06/07/2016  . HEMOGLOBIN A1C  06/12/2016  . OPHTHALMOLOGY EXAM  08/21/2016  . COLONOSCOPY  03/31/2017  . TETANUS/TDAP  06/04/2021  . DEXA SCAN  Completed  . Hepatitis C Screening  Completed      Plan:     I have personally reviewed and  addressed the Medicare Annual Wellness questionnaire and have noted the following in the patient's chart:  A. Medical and social history B. Use of alcohol, tobacco or illicit drugs  C. Current medications and supplements D. Functional ability and status E.  Nutritional status F.  Physical activity G. Advance directives H. List of other physicians I.  Hospitalizations, surgeries, and ER visits in previous 12 months J.  [place] to include hearing, vision, cognitive, depression L. Referrals and appointments - none  In addition, I have reviewed and discussed with patient certain preventive protocols, quality metrics, and best practice recommendations. A written personalized care plan for preventive services as well as general preventive health recommendations were provided to patient.  See attached scanned questionnaire for additional information.   Signed,   Lindell Noe, MHA, BS, LPN Health Coach

## 2015-12-14 NOTE — Progress Notes (Signed)
PCP notes:   Health maintenance:  Hep C screening- completed Shingles vaccine - declined Flu vaccine - declined  Abnormal screenings:   None  Patient concerns:   None  Nurse concerns:  None  Next PCP appt:   12/16/15 @ 1430

## 2015-12-14 NOTE — Patient Instructions (Signed)
Amy Fry , Thank you for taking time to come for your Medicare Wellness Visit. I appreciate your ongoing commitment to your health goals. Please review the following plan we discussed and let me know if I can assist you in the future.   These are the goals we discussed: Goals    . Increase water intake          Starting 12/14/2015, I will attempt to drink at least 8 oz water with each meal daily.        This is a list of the screening recommended for you and due dates:  Health Maintenance  Topic Date Due  . Pneumonia vaccines (1 of 2 - PCV13) 02/17/2020*  . Flu Shot  12/13/2025*  . Shingles Vaccine  12/13/2025*  . Mammogram  02/17/2016  . Complete foot exam   06/07/2016  . Hemoglobin A1C  06/12/2016  . Eye exam for diabetics  08/21/2016  . Colon Cancer Screening  03/31/2017  . Tetanus Vaccine  06/04/2021  . DEXA scan (bone density measurement)  Completed  .  Hepatitis C: One time screening is recommended by Center for Disease Control  (CDC) for  adults born from [birth year] through 1964.   Completed  *Topic was postponed. The date shown is not the original due date.   Preventive Care for Adults  A healthy lifestyle and preventive care can promote health and wellness. Preventive health guidelines for adults include the following key practices.  . A routine yearly physical is a good way to check with your health care provider about your health and preventive screening. It is a chance to share any concerns and updates on your health and to receive a thorough exam.  . Visit your dentist for a routine exam and preventive care every 6 months. Brush your teeth twice a day and floss once a day. Good oral hygiene prevents tooth decay and gum disease.  . The frequency of eye exams is based on your age, health, family medical history, use  of contact lenses, and other factors. Follow your health care provider's ecommendations for frequency of eye exams.  . Eat a healthy diet. Foods like  vegetables, fruits, whole grains, low-fat dairy products, and lean protein foods contain the nutrients you need without too many calories. Decrease your intake of foods high in solid fats, added sugars, and salt. Eat the right amount of calories for you. Get information about a proper diet from your health care provider, if necessary.  . Regular physical exercise is one of the most important things you can do for your health. Most adults should get at least 150 minutes of moderate-intensity exercise (any activity that increases your heart rate and causes you to sweat) each week. In addition, most adults need muscle-strengthening exercises on 2 or more days a week.  Silver Sneakers may be a benefit available to you. To determine eligibility, you may visit the website: www.silversneakers.com or contact program at 308-031-4867 Mon-Fri between 8AM-8PM.   . Maintain a healthy weight. The body mass index (BMI) is a screening tool to identify possible weight problems. It provides an estimate of body fat based on height and weight. Your health care provider can find your BMI and can help you achieve or maintain a healthy weight.   For adults 20 years and older: ? A BMI below 18.5 is considered underweight. ? A BMI of 18.5 to 24.9 is normal. ? A BMI of 25 to 29.9 is considered overweight. ? A BMI of  30 and above is considered obese.   . Maintain normal blood lipids and cholesterol levels by exercising and minimizing your intake of saturated fat. Eat a balanced diet with plenty of fruit and vegetables. Blood tests for lipids and cholesterol should begin at age 69 and be repeated every 5 years. If your lipid or cholesterol levels are high, you are over 50, or you are at high risk for heart disease, you may need your cholesterol levels checked more frequently. Ongoing high lipid and cholesterol levels should be treated with medicines if diet and exercise are not working.  . If you smoke, find out from your  health care provider how to quit. If you do not use tobacco, please do not start.  . If you choose to drink alcohol, please do not consume more than 2 drinks per day. One drink is considered to be 12 ounces (355 mL) of beer, 5 ounces (148 mL) of wine, or 1.5 ounces (44 mL) of liquor.  . If you are 70-66 years old, ask your health care provider if you should take aspirin to prevent strokes.  . Use sunscreen. Apply sunscreen liberally and repeatedly throughout the day. You should seek shade when your shadow is shorter than you. Protect yourself by wearing long sleeves, pants, a wide-brimmed hat, and sunglasses year round, whenever you are outdoors.  . Once a month, do a whole body skin exam, using a mirror to look at the skin on your back. Tell your health care provider of new moles, moles that have irregular borders, moles that are larger than a pencil eraser, or moles that have changed in shape or color.

## 2015-12-14 NOTE — Progress Notes (Signed)
Pre visit review using our clinic review tool, if applicable. No additional management support is needed unless otherwise documented below in the visit note. 

## 2015-12-14 NOTE — Progress Notes (Signed)
I reviewed health advisor's note, was available for consultation, and agree with documentation and plan.  

## 2015-12-15 LAB — HEPATITIS C ANTIBODY: HCV Ab: NEGATIVE

## 2015-12-16 ENCOUNTER — Ambulatory Visit (INDEPENDENT_AMBULATORY_CARE_PROVIDER_SITE_OTHER): Payer: PPO | Admitting: Family Medicine

## 2015-12-16 ENCOUNTER — Encounter: Payer: Self-pay | Admitting: Family Medicine

## 2015-12-16 VITALS — BP 132/68 | HR 73 | Temp 98.3°F | Ht 63.0 in | Wt 184.5 lb

## 2015-12-16 DIAGNOSIS — E119 Type 2 diabetes mellitus without complications: Secondary | ICD-10-CM | POA: Diagnosis not present

## 2015-12-16 DIAGNOSIS — M8589 Other specified disorders of bone density and structure, multiple sites: Secondary | ICD-10-CM

## 2015-12-16 DIAGNOSIS — E6609 Other obesity due to excess calories: Secondary | ICD-10-CM

## 2015-12-16 DIAGNOSIS — E7800 Pure hypercholesterolemia, unspecified: Secondary | ICD-10-CM

## 2015-12-16 DIAGNOSIS — Z0000 Encounter for general adult medical examination without abnormal findings: Secondary | ICD-10-CM

## 2015-12-16 DIAGNOSIS — Z1159 Encounter for screening for other viral diseases: Secondary | ICD-10-CM

## 2015-12-16 DIAGNOSIS — I10 Essential (primary) hypertension: Secondary | ICD-10-CM | POA: Diagnosis not present

## 2015-12-16 DIAGNOSIS — E559 Vitamin D deficiency, unspecified: Secondary | ICD-10-CM

## 2015-12-16 DIAGNOSIS — Z6832 Body mass index (BMI) 32.0-32.9, adult: Secondary | ICD-10-CM

## 2015-12-16 NOTE — Patient Instructions (Addendum)
Glucose control is not quite as good- so watch serving sizes of carbohydrates  Also after the holidays think about starting an exercise program in our outside your home (work up to 30 minutes daily) in addition to your regular activity  Other labs are stable  Cholesterol is improved Take care of yourself   Follow up in 6 months with labs prior

## 2015-12-16 NOTE — Progress Notes (Signed)
Subjective:    Patient ID: Amy Fry, female    DOB: 08/14/1945, 70 y.o.   MRN: [id number]  HPI Here for health maintenance exam and to review chronic medical problems    Doing well  Very busy month with the holidays  Taking fair care of herself    Had AMW on 11/29 Hep c screening completed Shingles vaccine declined Flu vaccine declined   Wt Readings from Last 3 Encounters:  12/16/15 184 lb 8 oz (83.7 kg)  12/14/15 184 lb 8 oz (83.7 kg)  06/08/15 189 lb 12 oz (86.1 kg)  very active but no formal exercise program  Eating well / she tries - loves sweets but she avoids them  bmi is 32.6  Mammogram 2/17 per pt nl at gyn-and normal  Self breast exam -no lumps  Goes to gyn exery year   Eye exam 8/17-normal   Colonoscopy 3/14 -adenomatous polyp - recall is 5 years   Tetanus vaccine 5/13  dexa 2/16-osteopenia  D level is 60.9 Humerus fx 5/15-no fractures since  Could not tolerate alendronate  No falls   Gyn watches this   Pap 5/16 neg with gyn   bp is stable today  No cp or palpitations or headaches or edema  No side effects to medicines  BP Readings from Last 3 Encounters:  12/16/15 132/68  12/14/15 120/72  06/08/15 128/75      DM2 Lab Results  Component Value Date   HGBA1C 6.6 (H) 12/14/2015   This is up a bit from 6.4 Ate more at thanksgiving (still avoids the sweets)  Takes metformin -no side effects  On ace opthy utd   Hx of hyperlipidemia Lab Results  Component Value Date   CHOL 163 12/14/2015   CHOL 161 06/01/2015   CHOL 153 12/02/2014   Lab Results  Component Value Date   HDL 62.40 12/14/2015   HDL 50.90 06/01/2015   HDL 58.40 12/02/2014   Lab Results  Component Value Date   LDLCALC 78 12/14/2015   LDLCALC 90 06/01/2015   LDLCALC 78 12/02/2014   Lab Results  Component Value Date   TRIG 113.0 12/14/2015   TRIG 101.0 06/01/2015   TRIG 82.0 12/02/2014   Lab Results  Component Value Date   CHOLHDL 3 12/14/2015   CHOLHDL 3 06/01/2015   CHOLHDL 3 12/02/2014   No results found for: LDLDIRECT   On mevacor and diet  Also gemfibrozil  Well controlled   Lab Results  Component Value Date   WBC 4.9 12/14/2015   HGB 14.4 12/14/2015   HCT 41.9 12/14/2015   MCV 91.8 12/14/2015   PLT 310.0 12/14/2015      Chemistry      Component Value Date/Time   NA 139 12/14/2015 0931   K 4.6 12/14/2015 0931   CL 102 12/14/2015 0931   CO2 29 12/14/2015 0931   BUN 22 12/14/2015 0931   CREATININE 0.98 12/14/2015 0931      Component Value Date/Time   CALCIUM 10.2 12/14/2015 0931   CALCIUM 8.9 06/11/2013 0615   ALKPHOS 80 12/14/2015 0931   AST 18 12/14/2015 0931   ALT 15 12/14/2015 0931   BILITOT 0.5 12/14/2015 0931       Patient Active Problem List   Diagnosis Date Noted  . Need for hepatitis C screening test 12/13/2015  . Obesity 12/11/2014  . Herpes simplex 03/16/2014  . Rash and nonspecific skin eruption 09/22/2013  . Pedal edema 06/15/2013  . Acute blood loss  anemia 06/15/2013  . Constipation 06/15/2013  . History of fracture of arm 06/09/2013  . Encounter for Medicare annual wellness exam 06/17/2012  . Hypokalemia 06/17/2012  . Routine general medical examination at a health care facility 02/18/2011  . Vitamin D deficiency 07/30/2008  . DERMATOPHYTOSIS, NAIL 07/26/2006  . Diabetes type 2, controlled ([place]) 07/25/2006  . HYPERCHOLESTEROLEMIA 07/25/2006  . Essential hypertension 07/25/2006  . Osteopenia 07/25/2006   Past Medical History:  Diagnosis Date  . Arthritis   . Diabetes mellitus    type II  . Early cataracts, bilateral    no sx yet   . Hyperlipidemia   . Hypertension   . Osteopenia    Past Surgical History:  Procedure Laterality Date  . CYSTOSCOPY  10-1999  . FRACTURE SURGERY  2010   rib fx- from fall   . LAPAROSCOPIC CHOLECYSTECTOMY    . OOPHORECTOMY    . ORIF HUMERUS FRACTURE Left 06/09/2013   Procedure: OPEN REDUCTION INTERNAL FIXATION (ORIF) PROXIMAL HUMERUS  FRACTURE;  Surgeon: Rozanna Box, MD;  Location: [place];  Service: Orthopedics;  Laterality: Left;   Social History  Substance Use Topics  . Smoking status: Never Smoker  . Smokeless tobacco: Never Used  . Alcohol use No   Family History  Problem Relation Age of Onset  . Heart attack Mother   . Heart attack Brother   . Stroke Brother   . Depression Sister   . Colon cancer Neg Hx   . Stomach cancer Neg Hx   . Esophageal cancer Neg Hx   . Rectal cancer Neg Hx    Allergies  Allergen Reactions  . Alendronate Sodium     REACTION: reaction not known  . Atorvastatin     REACTION: headaches  . Pravastatin Sodium     REACTION: headaches and arm pain   Current Outpatient Prescriptions on File Prior to Visit  Medication Sig Dispense Refill  . acyclovir ointment (ZOVIRAX) 5 % Apply 1 application topically every 3 (three) hours. To affected areas 30 g 1  . Ascorbic Acid (VITAMIN C) 1000 MG tablet Take 500 mg by mouth daily.     Marland Kitchen aspirin 81 MG EC tablet Take 81 mg by mouth daily.      . Calcium Carbonate-Vit D-Min (CALCIUM 1200 PO) Take 1 tablet by mouth 2 (two) times daily.    . Cholecalciferol (VITAMIN D) 2000 UNITS CAPS Take 2,000 Units by mouth daily.     Marland Kitchen CRANBERRY PO Take 4,200 Units by mouth daily.    Marland Kitchen docusate sodium 100 MG CAPS Take 100 mg by mouth 2 (two) times daily. 20 capsule 1  . gemfibrozil (LOPID) 600 MG tablet Take 1 tablet (600 mg total) by mouth 2 (two) times daily. 60 tablet 11  . lisinopril (PRINIVIL,ZESTRIL) 5 MG tablet Take 1 tablet (5 mg total) by mouth daily. 30 tablet 11  . lovastatin (MEVACOR) 20 MG tablet Take 1 tablet (20 mg total) by mouth daily. 30 tablet 11  . metFORMIN (GLUCOPHAGE) 500 MG tablet Take 1 tablet (500 mg total) by mouth 2 (two) times daily with a meal. 60 tablet 11  . Omega-3 300 MG CAPS Take 300 mg by mouth 2 (two) times daily.     Marland Kitchen spironolactone (ALDACTONE) 25 MG tablet TAKE 1/2 A TABLET BY MOUTH DAILY 15 tablet 11  . vitamin B-12  (CYANOCOBALAMIN) 1000 MCG tablet Take 1,000 mcg by mouth daily.     No current facility-administered medications on file prior to visit.  Review of Systems    Review of Systems  Constitutional: Negative for fever, appetite change, fatigue and unexpected weight change.  Eyes: Negative for pain and visual disturbance.  Respiratory: Negative for cough and shortness of breath.   Cardiovascular: Negative for cp or palpitations    Gastrointestinal: Negative for nausea, diarrhea and constipation.  Genitourinary: Negative for urgency and frequency.  Skin: Negative for pallor or rash   MSK pos for occ stiff joints  Neurological: Negative for weakness, light-headedness, numbness and headaches.  Hematological: Negative for adenopathy. Does not bruise/bleed easily.  Psychiatric/Behavioral: Negative for dysphoric mood. The patient is not nervous/anxious.      Objective:   Physical Exam  Constitutional: She appears well-developed and well-nourished. No distress.  obese and well appearing   HENT:  Head: Normocephalic and atraumatic.  Right Ear: External ear normal.  Left Ear: External ear normal.  Nose: Nose normal.  Mouth/Throat: Oropharynx is clear and moist.  Eyes: Conjunctivae and EOM are normal. Pupils are equal, round, and reactive to light. Right eye exhibits no discharge. Left eye exhibits no discharge. No scleral icterus.  Neck: Normal range of motion. Neck supple. No JVD present. Carotid bruit is not present. No thyromegaly present.  Cardiovascular: Normal rate, regular rhythm, normal heart sounds and intact distal pulses.  Exam reveals no gallop.   Pulmonary/Chest: Effort normal and breath sounds normal. No respiratory distress. She has no wheezes. She has no rales.  Abdominal: Soft. Bowel sounds are normal. She exhibits no distension and no mass. There is no tenderness.  Genitourinary:  Genitourinary Comments: Breast and pelvic exam done by gyn  Musculoskeletal: She exhibits no  edema or tenderness.  Lymphadenopathy:    She has no cervical adenopathy.  Neurological: She is alert. She has normal reflexes. No cranial nerve deficit. She exhibits normal muscle tone. Coordination normal.  Skin: Skin is warm and dry. No rash noted. No erythema. No pallor.  Lentigines and skin tags noted  Psychiatric: She has a normal mood and affect.          Assessment & Plan:   Problem List Items Addressed This Visit      Cardiovascular and Mediastinum   Essential hypertension - Primary    bp in fair control at this time  BP Readings from Last 1 Encounters:  12/16/15 132/68   No changes needed Disc lifstyle change with low sodium diet and exercise  Labs reviewed  Wt loss enc        Endocrine   Diabetes type 2, controlled ([place])    Lab Results  Component Value Date   HGBA1C 6.6 (H) 12/14/2015   This is up slightly  Metformin Disc low glycemic diet and need for exercise  F/u 6 mo        Musculoskeletal and Integument   Osteopenia    Rev last dexa No falls or fx Followed by gyn- intolerant of alendronate Disc need for calcium/ vitamin D/ wt bearing exercise and bone density test every 2 y to monitor Disc safety/ fracture risk in detail          Other   Vitamin D deficiency    Vitamin D level is therapeutic with current supplementation Disc importance of this to bone and overall health  Level in 60s      Routine general medical examination at a health care facility    Reviewed health habits including diet and exercise and skin cancer prevention Reviewed appropriate screening tests for age  Also reviewed health  mt list, fam hx and immunization status , as well as social and family history   See HPI Labs reviewed  AMW reviewed  Pt continues to decline shingles and flu vaccines Sees gyn for gyn care        Obesity    bmi 17 Discussed how this problem influences overall health and the risks it imposes  Reviewed plan for weight loss with lower  calorie diet (via better food choices and also portion control or program like weight watchers) and exercise building up to or more than 30 minutes 5 days per week including some aerobic activity         Need for hepatitis C screening test    Neg screen test       HYPERCHOLESTEROLEMIA    Disc goals for lipids and reasons to control them Rev labs with pt Rev low sat fat diet in detail Well controlled with statin and fibrate and diet

## 2015-12-16 NOTE — Progress Notes (Signed)
Pre visit review using our clinic review tool, if applicable. No additional management support is needed unless otherwise documented below in the visit note. 

## 2015-12-17 NOTE — Assessment & Plan Note (Signed)
Neg screen test

## 2015-12-17 NOTE — Assessment & Plan Note (Signed)
bp in fair control at this time  BP Readings from Last 1 Encounters:  12/16/15 132/68   No changes needed Disc lifstyle change with low sodium diet and exercise  Labs reviewed  Wt loss enc

## 2015-12-17 NOTE — Assessment & Plan Note (Signed)
Lab Results  Component Value Date   HGBA1C 6.6 (H) 12/14/2015   This is up slightly  Metformin Disc low glycemic diet and need for exercise  F/u 6 mo

## 2015-12-17 NOTE — Assessment & Plan Note (Signed)
Rev last dexa No falls or fx Followed by gyn- intolerant of alendronate Disc need for calcium/ vitamin D/ wt bearing exercise and bone density test every 2 y to monitor Disc safety/ fracture risk in detail

## 2015-12-17 NOTE — Assessment & Plan Note (Signed)
Reviewed health habits including diet and exercise and skin cancer prevention Reviewed appropriate screening tests for age  Also reviewed health mt list, fam hx and immunization status , as well as social and family history   See HPI Labs reviewed  AMW reviewed  Pt continues to decline shingles and flu vaccines Sees gyn for gyn care

## 2015-12-17 NOTE — Assessment & Plan Note (Signed)
bmi 32 Discussed how this problem influences overall health and the risks it imposes  Reviewed plan for weight loss with lower calorie diet (via better food choices and also portion control or program like weight watchers) and exercise building up to or more than 30 minutes 5 days per week including some aerobic activity

## 2015-12-17 NOTE — Assessment & Plan Note (Signed)
Vitamin D level is therapeutic with current supplementation ?Disc importance of this to bone and overall health ? ?Level in 60s  ? ?

## 2015-12-17 NOTE — Assessment & Plan Note (Signed)
Disc goals for lipids and reasons to control them Rev labs with pt Rev low sat fat diet in detail Well controlled with statin and fibrate and diet

## 2016-02-02 IMAGING — CR DG HUMERUS 2V *L*
1 series · 3 of 3 positions shown · non-contrast
Comparison: None.

CLINICAL DATA: Pain 2nd to fall

EXAM:
LEFT HUMERUS - 2+ VIEW

[Series 1: lat · 0.17mm/px · 3 of 3 slices shown]
[im 1/3]
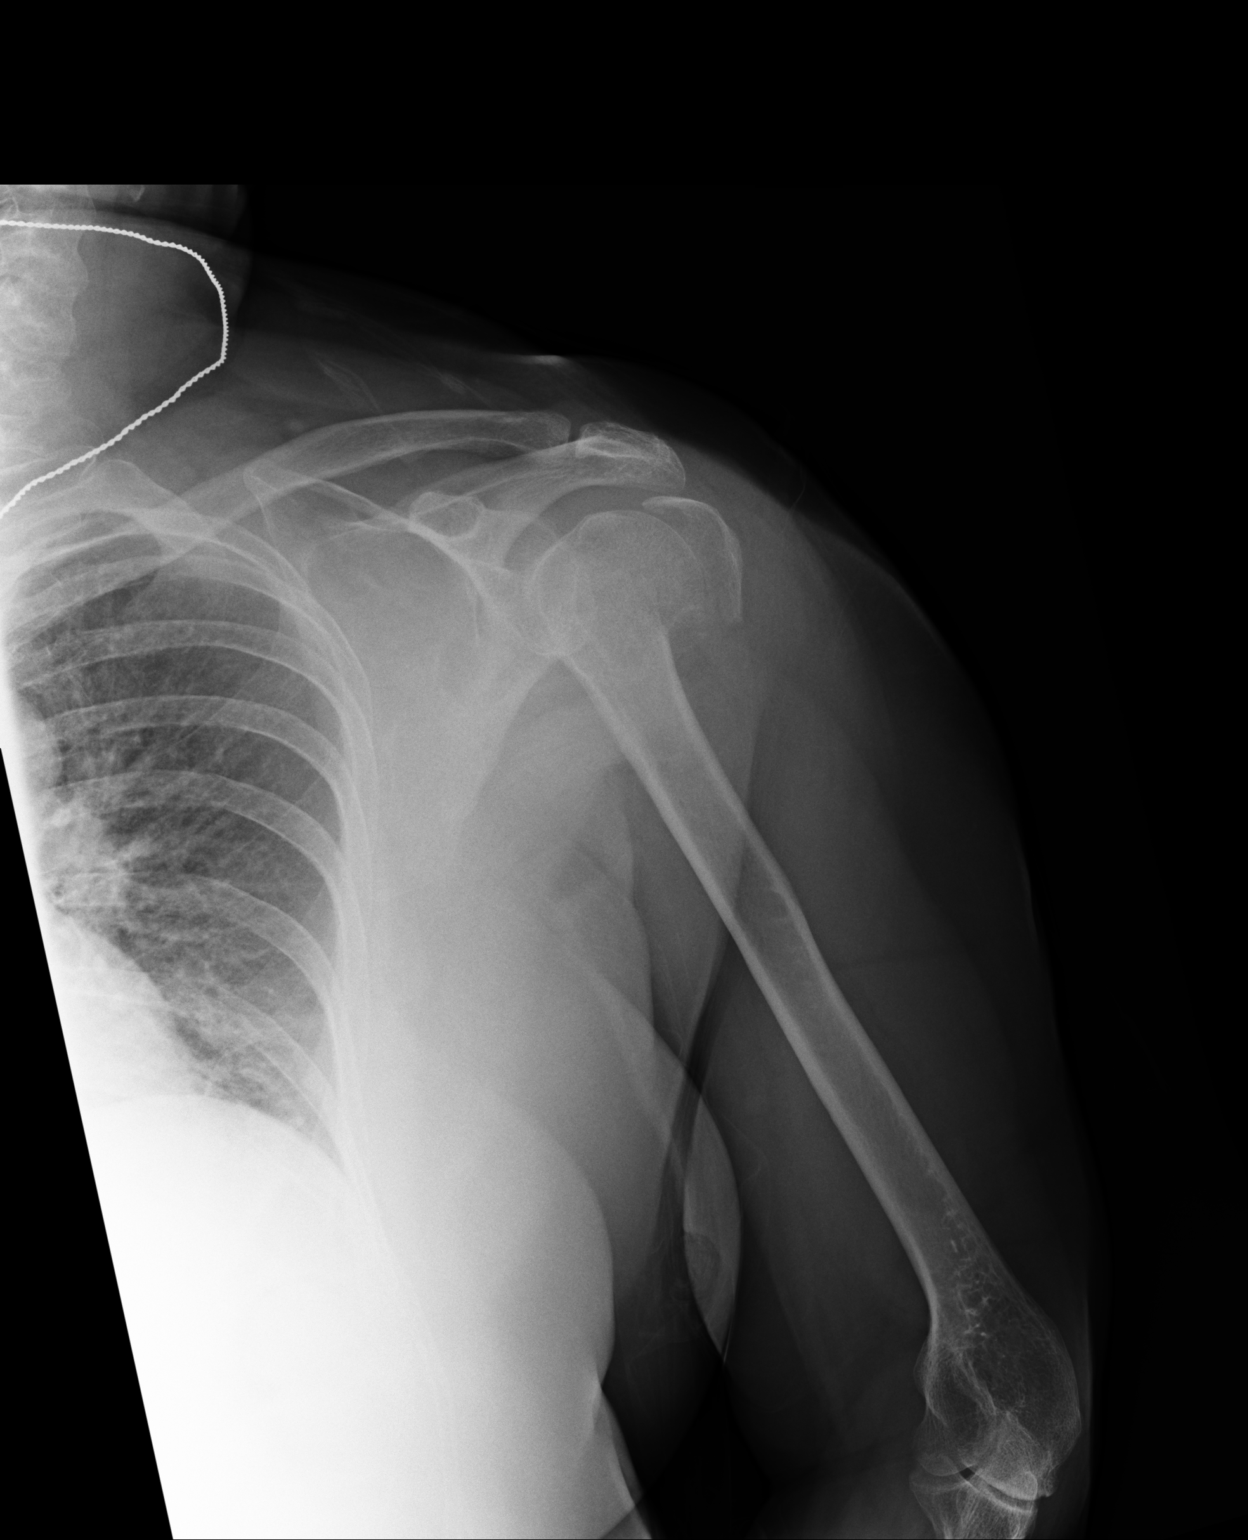
[im 2/3]
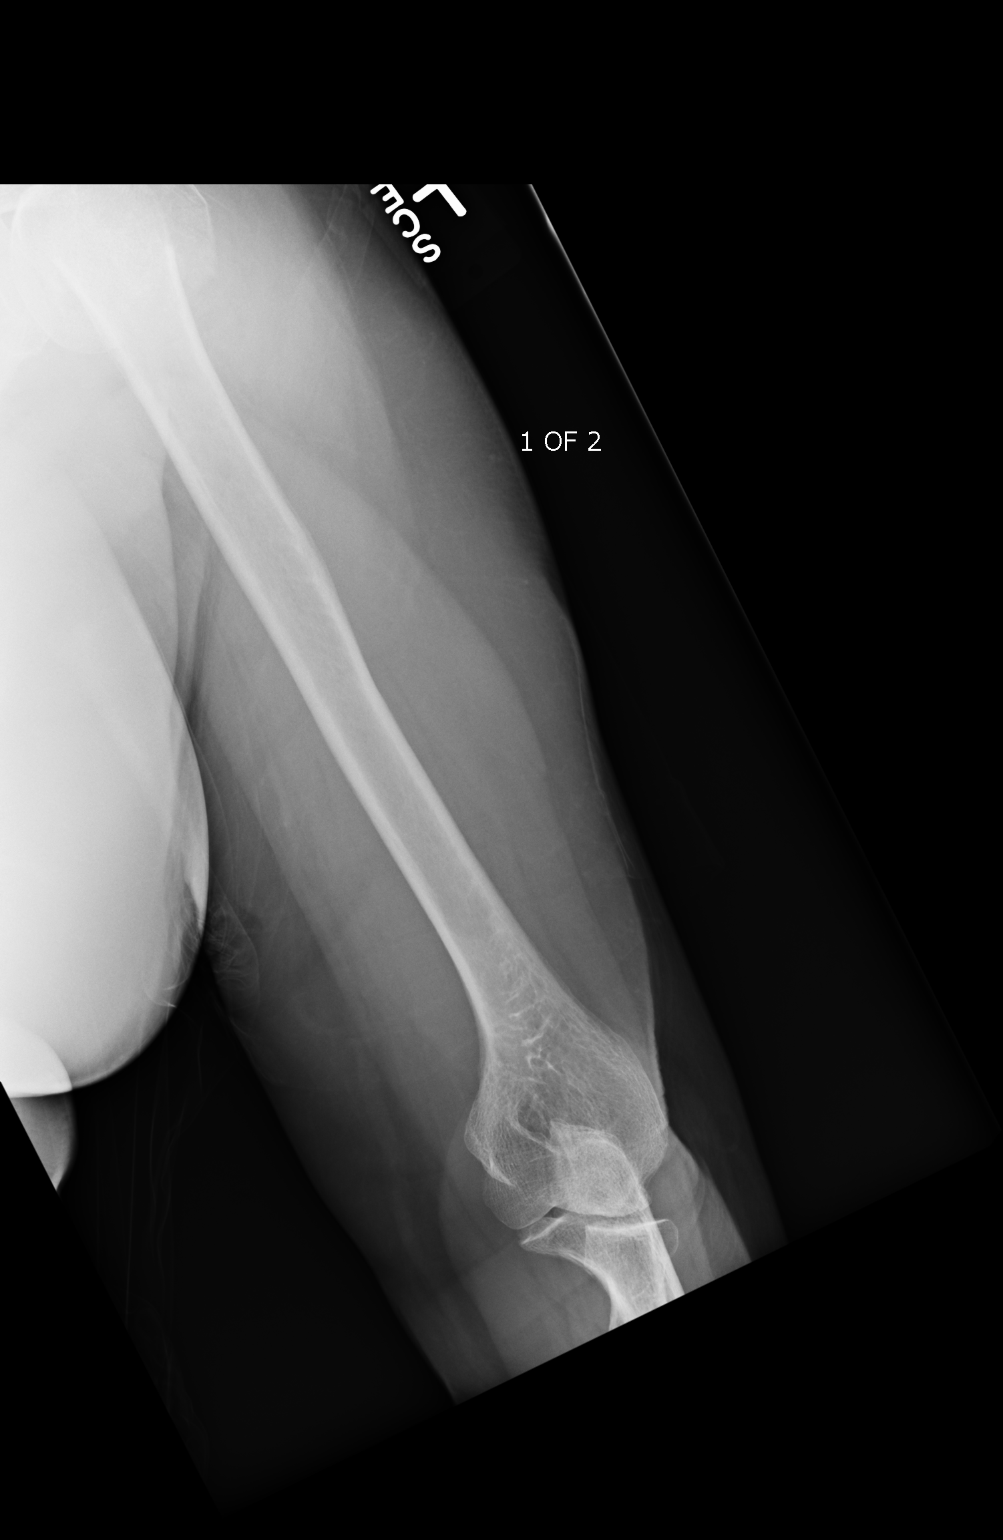
[im 3/3]
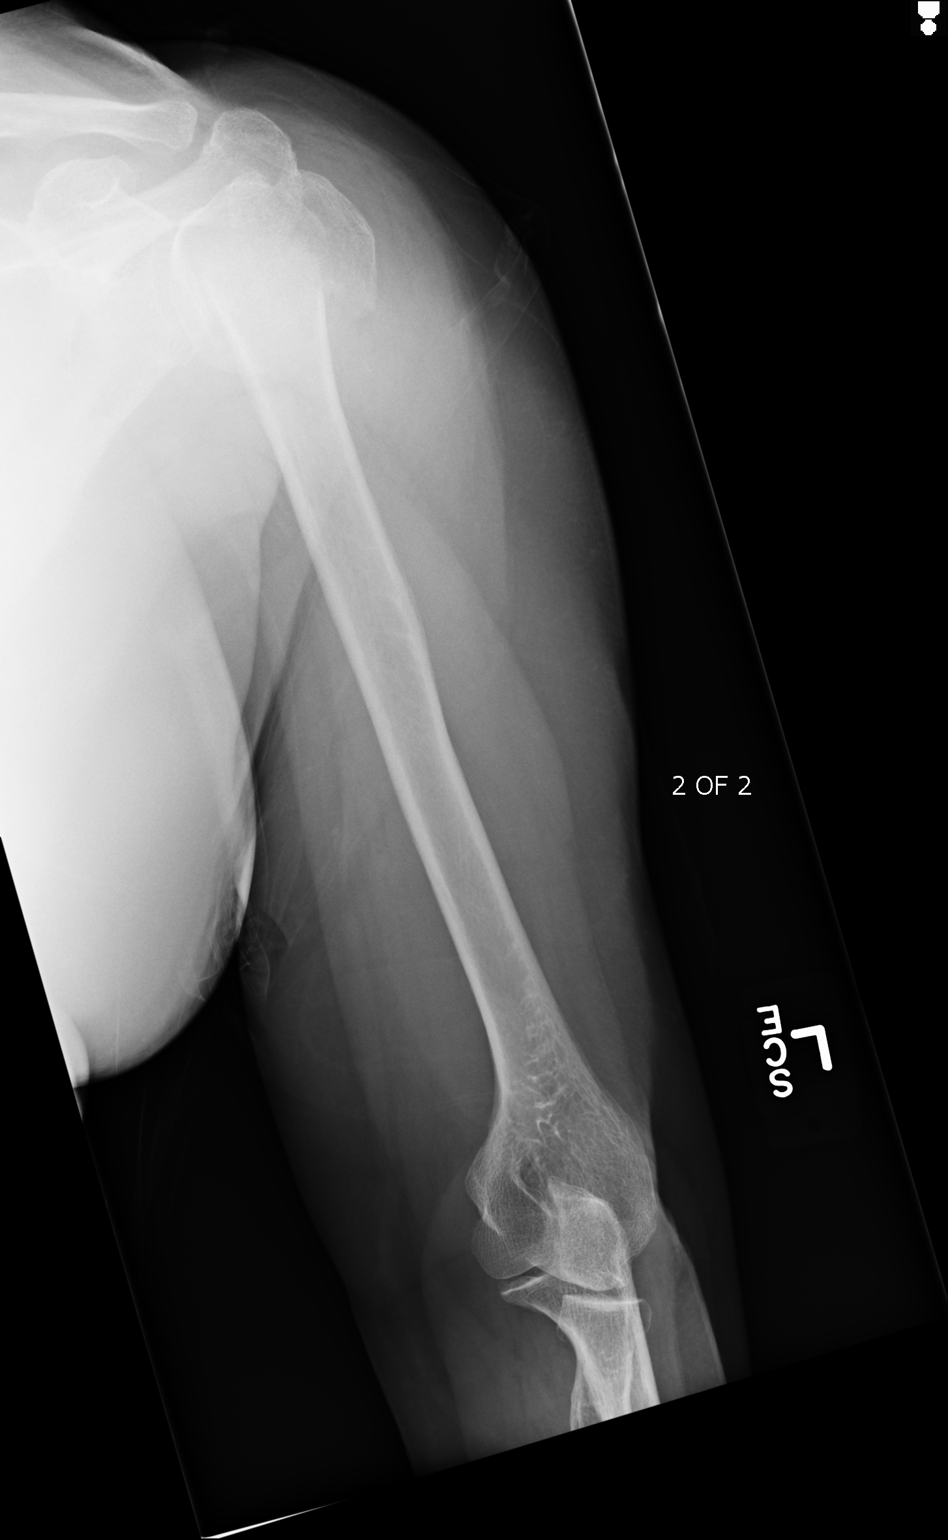

[3 of 3 positions shown; findings below may reference images not displayed]

FINDINGS: Comminuted impacted humeral head fracture. A butterfly fragment
along the lateral aspect of the humeral head.
IMPRESSION: Comminuted impacted humeral head fracture.

## 2016-02-03 IMAGING — CT CT 3D INDEPENDENT WKST
1 of 5 series · 3 of 14 positions shown, 4 images · non-contrast
Comparison: none

CLINICAL DATA: Abnormal findings on recent x-ray imaging which
demonstrated a fracture of proximal left humerus

EXAM:
3-DIMENSIONAL CT IMAGE RENDERING ON INDEPENDENT WORKSTATION
TECHNIQUE: 3-dimensional CT images were rendered by post-processing of the
original CT data at independent workstation.

[Series 101: sag obl st · axial · 0.51mm/px · z∈[-104,+26]mm · 3 of 132 slices shown, 4 images]
[im 1/132  soft-tissue]
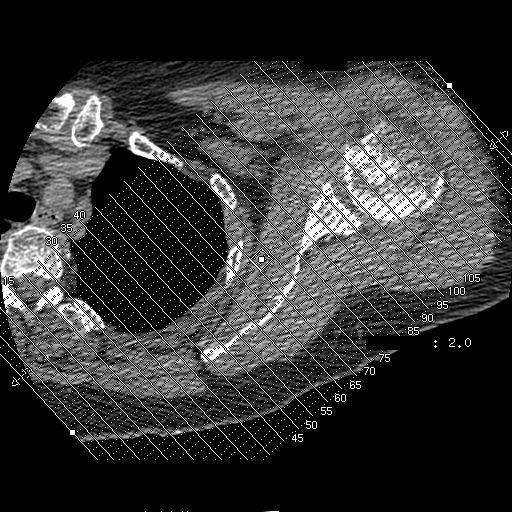
[im 1/132  bone]
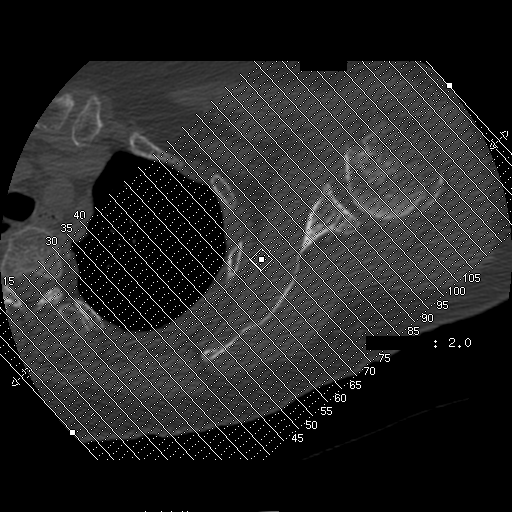
[im 66/132  bone]
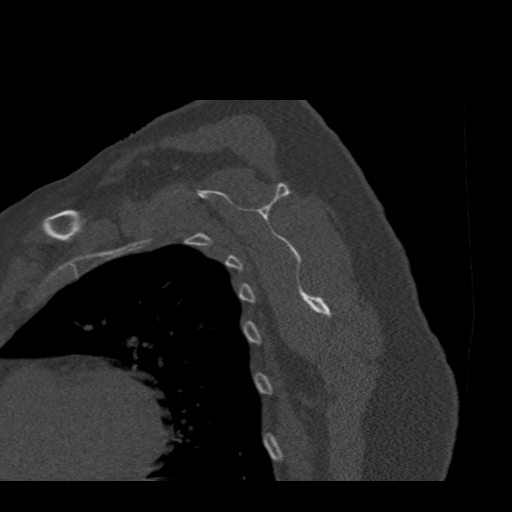
[im 132/132  bone]
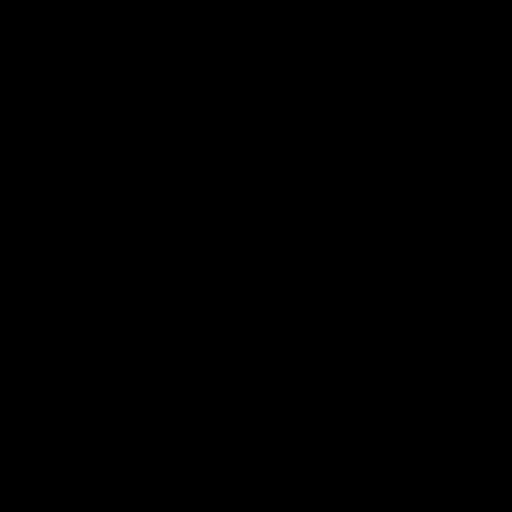

[3 of 14 positions shown; findings below may reference images not displayed]

FINDINGS: Three dimensional reconstructions were performed by the radiologist
at an independent workstation.
IMPRESSION: As above.

## 2016-06-10 ENCOUNTER — Telehealth: Payer: Self-pay | Admitting: Family Medicine

## 2016-06-10 DIAGNOSIS — I10 Essential (primary) hypertension: Secondary | ICD-10-CM

## 2016-06-10 DIAGNOSIS — E7800 Pure hypercholesterolemia, unspecified: Secondary | ICD-10-CM

## 2016-06-10 DIAGNOSIS — E119 Type 2 diabetes mellitus without complications: Secondary | ICD-10-CM

## 2016-06-10 NOTE — Telephone Encounter (Signed)
-----   Message from Ellamae Sia sent at 06/04/2016  4:30 PM EDT ----- Regarding: Lab orders for Thursday, 5.31.18 Lab orders for a 6 month follow up appt

## 2016-06-13 ENCOUNTER — Other Ambulatory Visit: Payer: Self-pay | Admitting: Family Medicine

## 2016-06-13 ENCOUNTER — Other Ambulatory Visit: Payer: PPO

## 2016-06-14 ENCOUNTER — Other Ambulatory Visit (INDEPENDENT_AMBULATORY_CARE_PROVIDER_SITE_OTHER): Payer: PPO

## 2016-06-14 DIAGNOSIS — E7800 Pure hypercholesterolemia, unspecified: Secondary | ICD-10-CM | POA: Diagnosis not present

## 2016-06-14 DIAGNOSIS — I10 Essential (primary) hypertension: Secondary | ICD-10-CM | POA: Diagnosis not present

## 2016-06-14 DIAGNOSIS — E119 Type 2 diabetes mellitus without complications: Secondary | ICD-10-CM | POA: Diagnosis not present

## 2016-06-14 LAB — COMPREHENSIVE METABOLIC PANEL
ALBUMIN: 4.5 g/dL (ref 3.5–5.2)
ALK PHOS: 77 U/L (ref 39–117)
ALT: 17 U/L (ref 0–35)
AST: 22 U/L (ref 0–37)
BILIRUBIN TOTAL: 0.4 mg/dL (ref 0.2–1.2)
BUN: 15 mg/dL (ref 6–23)
CALCIUM: 9.9 mg/dL (ref 8.4–10.5)
CO2: 29 mEq/L (ref 19–32)
Chloride: 106 mEq/L (ref 96–112)
Creatinine, Ser: 1 mg/dL (ref 0.40–1.20)
GFR: 58.32 mL/min — AB (ref >60.00)
Glucose, Bld: 120 mg/dL — ABNORMAL HIGH (ref 70–99)
Potassium: 4.5 mEq/L (ref 3.5–5.1)
Sodium: 140 mEq/L (ref 135–145)
TOTAL PROTEIN: 7.1 g/dL (ref 6.0–8.3)

## 2016-06-14 LAB — LIPID PANEL
CHOL/HDL RATIO: 2
Cholesterol: 159 mg/dL (ref 0–200)
HDL: 66.3 mg/dL (ref >39.00)
LDL Cholesterol: 75 mg/dL (ref 0–99)
NONHDL: 92.54
TRIGLYCERIDES: 90 mg/dL (ref 0.0–149.0)
VLDL: 18 mg/dL (ref 0.0–40.0)

## 2016-06-14 LAB — HEMOGLOBIN A1C: HEMOGLOBIN A1C: 6.7 % — AB (ref 4.6–6.5)

## 2016-06-15 ENCOUNTER — Ambulatory Visit: Payer: PPO | Admitting: Family Medicine

## 2016-06-18 DIAGNOSIS — Z6833 Body mass index (BMI) 33.0-33.9, adult: Secondary | ICD-10-CM | POA: Diagnosis not present

## 2016-06-18 DIAGNOSIS — Z124 Encounter for screening for malignant neoplasm of cervix: Secondary | ICD-10-CM | POA: Diagnosis not present

## 2016-06-18 DIAGNOSIS — Z1231 Encounter for screening mammogram for malignant neoplasm of breast: Secondary | ICD-10-CM | POA: Diagnosis not present

## 2016-06-20 ENCOUNTER — Ambulatory Visit (INDEPENDENT_AMBULATORY_CARE_PROVIDER_SITE_OTHER): Payer: PPO | Admitting: Family Medicine

## 2016-06-20 ENCOUNTER — Encounter: Payer: Self-pay | Admitting: Family Medicine

## 2016-06-20 VITALS — BP 132/70 | HR 84 | Temp 98.0°F | Ht 63.0 in | Wt 191.8 lb

## 2016-06-20 DIAGNOSIS — E119 Type 2 diabetes mellitus without complications: Secondary | ICD-10-CM | POA: Diagnosis not present

## 2016-06-20 DIAGNOSIS — E6609 Other obesity due to excess calories: Secondary | ICD-10-CM

## 2016-06-20 DIAGNOSIS — Z6832 Body mass index (BMI) 32.0-32.9, adult: Secondary | ICD-10-CM | POA: Diagnosis not present

## 2016-06-20 DIAGNOSIS — E7800 Pure hypercholesterolemia, unspecified: Secondary | ICD-10-CM | POA: Diagnosis not present

## 2016-06-20 DIAGNOSIS — I10 Essential (primary) hypertension: Secondary | ICD-10-CM

## 2016-06-20 NOTE — Progress Notes (Signed)
Subjective:    Patient ID: Amy Fry, female    DOB: 13-Dec-1945, 71 y.o.   MRN: [id number]  HPI Here for 6 mo f/u of chronic medical problems   Doing well  Feeling good  [place] beach   Wt Readings from Last 3 Encounters:  06/20/16 191 lb 12 oz (87 kg)  12/16/15 184 lb 8 oz (83.7 kg)  12/14/15 184 lb 8 oz (83.7 kg)  not a lot of exercise right now  Diet - fair  Wt is up a bit  bmi 33.9  bp is stable today  No cp or palpitations or headaches or edema  No side effects to medicines  BP Readings from Last 3 Encounters:  06/20/16 132/70  12/16/15 132/68  12/14/15 120/72       Chemistry      Component Value Date/Time   NA 140 06/14/2016 0818   K 4.5 06/14/2016 0818   CL 106 06/14/2016 0818   CO2 29 06/14/2016 0818   BUN 15 06/14/2016 0818   CREATININE 1.00 06/14/2016 0818      Component Value Date/Time   CALCIUM 9.9 06/14/2016 0818   CALCIUM 8.9 06/11/2013 0615   ALKPHOS 77 06/14/2016 0818   AST 22 06/14/2016 0818   ALT 17 06/14/2016 0818   BILITOT 0.4 06/14/2016 0818        Diabetes-fairly stable  Home sugar results -no highs or lows DM diet - fair / eating some sugar free cookies  Breakfast - cereal (cheerios), with milk , some eggs on the weekend  Lunch - sandwich and occ chips  Dinner-vegetables with a meat and rolls  Not binging on fruit  Not a lot of snack foods  Exercise - yard and house work  Symptoms A1C last  Lab Results  Component Value Date   HGBA1C 6.7 (H) 06/14/2016  last one was 6.6  No problems with medications  Renal protection ace Last eye exam  8/17 Declines dm teaching - no time   Hyperlipidemia Lab Results  Component Value Date   CHOL 159 06/14/2016   CHOL 163 12/14/2015   CHOL 161 06/01/2015   Lab Results  Component Value Date   HDL 66.30 06/14/2016   HDL 62.40 12/14/2015   HDL 50.90 06/01/2015   Lab Results  Component Value Date   LDLCALC 75 06/14/2016   LDLCALC 78  12/14/2015   LDLCALC 90 06/01/2015   Lab Results  Component Value Date   TRIG 90.0 06/14/2016   TRIG 113.0 12/14/2015   TRIG 101.0 06/01/2015   Lab Results  Component Value Date   CHOLHDL 2 06/14/2016   CHOLHDL 3 12/14/2015   CHOLHDL 3 06/01/2015   No results found for: LDLDIRECT On gemfibrozil and lovastatin   Patient Active Problem List   Diagnosis Date Noted  . Need for hepatitis C screening test 12/13/2015  . Obesity 12/11/2014  . Herpes simplex 03/16/2014  . Pedal edema 06/15/2013  . Constipation 06/15/2013  . History of fracture of arm 06/09/2013  . Encounter for Medicare annual wellness exam 06/17/2012  . Hypokalemia 06/17/2012  . Routine general medical examination at a health care facility 02/18/2011  . Vitamin D deficiency 07/30/2008  . DERMATOPHYTOSIS, NAIL 07/26/2006  . Diabetes type 2, controlled ([place]) 07/25/2006  . HYPERCHOLESTEROLEMIA 07/25/2006  . Essential hypertension 07/25/2006  . Osteopenia 07/25/2006   Past Medical History:  Diagnosis Date  . Arthritis   . Diabetes mellitus  type II  . Early cataracts, bilateral    no sx yet   . Hyperlipidemia   . Hypertension   . Osteopenia    Past Surgical History:  Procedure Laterality Date  . CYSTOSCOPY  10-1999  . FRACTURE SURGERY  2010   rib fx- from fall   . LAPAROSCOPIC CHOLECYSTECTOMY    . OOPHORECTOMY    . ORIF HUMERUS FRACTURE Left 06/09/2013   Procedure: OPEN REDUCTION INTERNAL FIXATION (ORIF) PROXIMAL HUMERUS FRACTURE;  Surgeon: Rozanna Box, MD;  Location: [facility];  Service: Orthopedics;  Laterality: Left;   Social History  Substance Use Topics  . Smoking status: Never Smoker  . Smokeless tobacco: Never Used  . Alcohol use No   Family History  Problem Relation Age of Onset  . Heart attack Mother   . Heart attack Brother   . Stroke Brother   . Depression Sister   . Colon cancer Neg Hx   . Stomach cancer Neg Hx   . Esophageal cancer Neg Hx   . Rectal cancer Neg Hx     Allergies  Allergen Reactions  . Alendronate Sodium     REACTION: reaction not known  . Atorvastatin     REACTION: headaches  . Pravastatin Sodium     REACTION: headaches and arm pain   Current Outpatient Prescriptions on File Prior to Visit  Medication Sig Dispense Refill  . acyclovir ointment (ZOVIRAX) 5 % Apply 1 application topically every 3 (three) hours. To affected areas 30 g 1  . Ascorbic Acid (VITAMIN C) 1000 MG tablet Take 500 mg by mouth daily.     Marland Kitchen aspirin 81 MG EC tablet Take 81 mg by mouth daily.      . Calcium Carbonate-Vit D-Min (CALCIUM 1200 PO) Take 1 tablet by mouth 2 (two) times daily.    . Cholecalciferol (VITAMIN D) 2000 UNITS CAPS Take 2,000 Units by mouth daily.     Marland Kitchen CRANBERRY PO Take 4,200 Units by mouth daily.    Marland Kitchen gemfibrozil (LOPID) 600 MG tablet TAKE 1 TABLET BY MOUTH TWICE (2) DAILY 60 tablet 5  . lisinopril (PRINIVIL,ZESTRIL) 5 MG tablet TAKE 1 TABLET BY MOUTH ONCE DAILY 30 tablet 5  . lovastatin (MEVACOR) 20 MG tablet TAKE 1 TABLET BY MOUTH ONCE DAILY 30 tablet 5  . metFORMIN (GLUCOPHAGE) 500 MG tablet TAKE 1 TABLET BY MOUTH TWICE (2) DAILY WITH A MEAL 60 tablet 5  . Omega-3 300 MG CAPS Take 300 mg by mouth 2 (two) times daily.     Marland Kitchen spironolactone (ALDACTONE) 25 MG tablet TAKE 1/2 TABLET BY MOUTH ONCE DAILY 15 tablet 5  . vitamin B-12 (CYANOCOBALAMIN) 1000 MCG tablet Take 1,000 mcg by mouth daily.     No current facility-administered medications on file prior to visit.     Review of Systems Review of Systems  Constitutional: Negative for fever, appetite change, fatigue and unexpected weight change.  Eyes: Negative for pain and visual disturbance.  Respiratory: Negative for cough and shortness of breath.   Cardiovascular: Negative for cp or palpitations    Gastrointestinal: Negative for nausea, diarrhea and constipation.  Genitourinary: Negative for urgency and frequency.  Skin: Negative for pallor or rash   Neurological: Negative for  weakness, light-headedness, numbness and headaches.  Hematological: Negative for adenopathy. Does not bruise/bleed easily.  Psychiatric/Behavioral: Negative for dysphoric mood. The patient is not nervous/anxious.         Objective:   Physical Exam  Constitutional: She appears well-developed and well-nourished.  No distress.  obese and well appearing    HENT:  Head: Normocephalic and atraumatic.  Mouth/Throat: Oropharynx is clear and moist.  Eyes: Conjunctivae and EOM are normal. Pupils are equal, round, and reactive to light.  Neck: Normal range of motion. Neck supple. No JVD present. Carotid bruit is not present. No thyromegaly present.  Cardiovascular: Normal rate, regular rhythm, normal heart sounds and intact distal pulses.  Exam reveals no gallop.   Pulmonary/Chest: Effort normal and breath sounds normal. No respiratory distress. She has no wheezes. She has no rales.  No crackles  Abdominal: Soft. Bowel sounds are normal. She exhibits no distension, no abdominal bruit and no mass. There is no tenderness.  Musculoskeletal: She exhibits no edema.  Lymphadenopathy:    She has no cervical adenopathy.  Neurological: She is alert. She has normal reflexes.  Skin: Skin is warm and dry. No rash noted.  Psychiatric: She has a normal mood and affect.          Assessment & Plan:   Problem List Items Addressed This Visit      Cardiovascular and Mediastinum   Essential hypertension - Primary    bp in fair control at this time  BP Readings from Last 1 Encounters:  06/20/16 132/70   No changes needed Disc lifstyle change with low sodium diet and exercise  Wt loss enc  Labs rev  Ace and spironolactone         Endocrine   Diabetes type 2, controlled ([place])    Lab Results  Component Value Date   HGBA1C 6.7 (H) 06/14/2016   This is up from 6.6  Diet -more carbs Disc plan for exercise and better diet  Handout on carb counting She declines DM teaching due to lack of time F/u  after jan 1 for annual exam         Other   HYPERCHOLESTEROLEMIA    Doing well with gemfibrozil and lovastatin  Disc goals for lipids and reasons to control them Rev labs with pt Rev low sat fat diet in detail       Obesity    Discussed how this problem influences overall health and the risks it imposes  Reviewed plan for weight loss with lower calorie diet (via better food choices and also portion control or program like weight watchers) and exercise building up to or more than 30 minutes 5 days per week including some aerobic activity

## 2016-06-20 NOTE — Assessment & Plan Note (Signed)
bp in fair control at this time  BP Readings from Last 1 Encounters:  06/20/16 132/70   No changes needed Disc lifstyle change with low sodium diet and exercise  Wt loss enc  Labs rev  Ace and spironolactone

## 2016-06-20 NOTE — Patient Instructions (Addendum)
Aim for exercise 5 days per week 30 or more minutes (work up to that)  [employer] is good as is mowing and gardening  If you like to walk- that is best for your bones / also exercise videos are helpful   Don't forget to schedule your eye exam for august   Get some veggies for lunch  One piece of bread for sandwich (open face)   If you become interested in diabetic teaching in the [place] know  There is a good book called Sugar Busters that is helpful   Labs are fairly stable   Follow up after January 1 for your annual exam

## 2016-06-20 NOTE — Assessment & Plan Note (Signed)
Lab Results  Component Value Date   HGBA1C 6.7 (H) 06/14/2016   This is up from 6.6  Diet -more carbs Disc plan for exercise and better diet  Handout on carb counting She declines DM teaching due to lack of time F/u after jan 1 for annual exam

## 2016-06-21 NOTE — Assessment & Plan Note (Signed)
Doing well with gemfibrozil and lovastatin  Disc goals for lipids and reasons to control them Rev labs with pt Rev low sat fat diet in detail

## 2016-06-21 NOTE — Assessment & Plan Note (Signed)
Discussed how this problem influences overall health and the risks it imposes  Reviewed plan for weight loss with lower calorie diet (via better food choices and also portion control or program like weight watchers) and exercise building up to or more than 30 minutes 5 days per week including some aerobic activity    

## 2016-08-23 DIAGNOSIS — H524 Presbyopia: Secondary | ICD-10-CM | POA: Diagnosis not present

## 2016-08-23 DIAGNOSIS — H2513 Age-related nuclear cataract, bilateral: Secondary | ICD-10-CM | POA: Diagnosis not present

## 2016-08-23 DIAGNOSIS — E119 Type 2 diabetes mellitus without complications: Secondary | ICD-10-CM | POA: Diagnosis not present

## 2016-08-23 LAB — HM DIABETES EYE EXAM

## 2016-11-24 ENCOUNTER — Other Ambulatory Visit: Payer: Self-pay | Admitting: Family Medicine

## 2017-01-20 ENCOUNTER — Telehealth: Payer: Self-pay | Admitting: Family Medicine

## 2017-01-20 DIAGNOSIS — E559 Vitamin D deficiency, unspecified: Secondary | ICD-10-CM

## 2017-01-20 DIAGNOSIS — E7800 Pure hypercholesterolemia, unspecified: Secondary | ICD-10-CM

## 2017-01-20 DIAGNOSIS — I10 Essential (primary) hypertension: Secondary | ICD-10-CM

## 2017-01-20 DIAGNOSIS — E119 Type 2 diabetes mellitus without complications: Secondary | ICD-10-CM

## 2017-01-20 NOTE — Telephone Encounter (Signed)
-----   Message from Eustace Pen, LPN sent at 07/18/7157  3:42 PM EST ----- Regarding: Labs 1/10 Lab orders needed. Thank you.  Insurance:  [employer]

## 2017-01-24 ENCOUNTER — Ambulatory Visit: Payer: PPO

## 2017-01-24 ENCOUNTER — Ambulatory Visit (INDEPENDENT_AMBULATORY_CARE_PROVIDER_SITE_OTHER): Payer: PPO

## 2017-01-24 VITALS — BP 104/78 | HR 68 | Temp 98.5°F | Ht 62.75 in | Wt 187.2 lb

## 2017-01-24 DIAGNOSIS — E119 Type 2 diabetes mellitus without complications: Secondary | ICD-10-CM | POA: Diagnosis not present

## 2017-01-24 DIAGNOSIS — I10 Essential (primary) hypertension: Secondary | ICD-10-CM | POA: Diagnosis not present

## 2017-01-24 DIAGNOSIS — E559 Vitamin D deficiency, unspecified: Secondary | ICD-10-CM

## 2017-01-24 DIAGNOSIS — Z0000 Encounter for general adult medical examination without abnormal findings: Secondary | ICD-10-CM

## 2017-01-24 DIAGNOSIS — E7800 Pure hypercholesterolemia, unspecified: Secondary | ICD-10-CM | POA: Diagnosis not present

## 2017-01-24 LAB — COMPREHENSIVE METABOLIC PANEL
ALK PHOS: 74 U/L (ref 39–117)
ALT: 14 U/L (ref 0–35)
AST: 17 U/L (ref 0–37)
Albumin: 4.5 g/dL (ref 3.5–5.2)
BUN: 22 mg/dL (ref 6–23)
CO2: 30 meq/L (ref 19–32)
Calcium: 10.2 mg/dL (ref 8.4–10.5)
Chloride: 103 mEq/L (ref 96–112)
Creatinine, Ser: 0.94 mg/dL (ref 0.40–1.20)
GFR: 62.52 mL/min (ref >60.00)
GLUCOSE: 118 mg/dL — AB (ref 70–99)
POTASSIUM: 4.6 meq/L (ref 3.5–5.1)
Sodium: 139 mEq/L (ref 135–145)
TOTAL PROTEIN: 7.4 g/dL (ref 6.0–8.3)
Total Bilirubin: 0.5 mg/dL (ref 0.2–1.2)

## 2017-01-24 LAB — CBC WITH DIFFERENTIAL/PLATELET
Basophils Absolute: 0.1 10*3/uL (ref 0.0–0.1)
Basophils Relative: 0.7 % (ref 0.0–3.0)
EOS PCT: 1.5 % (ref 0.0–5.0)
Eosinophils Absolute: 0.1 10*3/uL (ref 0.0–0.7)
HCT: 43.4 % (ref 36.0–46.0)
Hemoglobin: 14.6 g/dL (ref 12.0–15.0)
LYMPHS ABS: 2.2 10*3/uL (ref 0.7–4.0)
Lymphocytes Relative: 30.2 % (ref 12.0–46.0)
MCHC: 33.6 g/dL (ref 30.0–36.0)
MCV: 94.4 fl (ref 78.0–100.0)
MONOS PCT: 8.1 % (ref 3.0–12.0)
Monocytes Absolute: 0.6 10*3/uL (ref 0.1–1.0)
NEUTROS PCT: 59.5 % (ref 43.0–77.0)
Neutro Abs: 4.3 10*3/uL (ref 1.4–7.7)
Platelets: 314 10*3/uL (ref 150.0–400.0)
RBC: 4.59 Mil/uL (ref 3.87–5.11)
RDW: 13.3 % (ref 11.5–15.5)
WBC: 7.2 10*3/uL (ref 4.0–10.5)

## 2017-01-24 LAB — HEMOGLOBIN A1C: HEMOGLOBIN A1C: 6.8 % — AB (ref 4.6–6.5)

## 2017-01-24 LAB — LIPID PANEL
CHOL/HDL RATIO: 3
Cholesterol: 165 mg/dL (ref 0–200)
HDL: 54.1 mg/dL (ref >39.00)
LDL Cholesterol: 79 mg/dL (ref 0–99)
NONHDL: 111.33
TRIGLYCERIDES: 162 mg/dL — AB (ref 0.0–149.0)
VLDL: 32.4 mg/dL (ref 0.0–40.0)

## 2017-01-24 LAB — TSH: TSH: 1.22 u[IU]/mL (ref 0.35–4.50)

## 2017-01-24 LAB — VITAMIN D 25 HYDROXY (VIT D DEFICIENCY, FRACTURES): VITD: 57.89 ng/mL (ref 30.00–100.00)

## 2017-01-24 NOTE — Progress Notes (Signed)
Pre visit review using our clinic review tool, if applicable. No additional management support is needed unless otherwise documented below in the visit note. 

## 2017-01-24 NOTE — Progress Notes (Signed)
PCP notes:   Health maintenance:  A1C - completed  Abnormal screenings:   None  Patient concerns:   None  Nurse concerns:  None  Next PCP appt:   01/28/2017 @ 1515

## 2017-01-24 NOTE — Progress Notes (Signed)
Subjective:   Amy Fry is a 72 y.o. female who presents for Medicare Annual (Subsequent) preventive examination.  Review of Systems:  N/A Cardiac Risk Factors include: advanced age (>65men, >61 women);diabetes mellitus;dyslipidemia;hypertension;obesity (BMI >30kg/m2)     Objective:     Vitals: BP 104/78 (BP Location: Right Arm, Patient Position: Sitting, Cuff Size: Normal)   Pulse 68   Temp 98.5 F (36.9 C) (Oral)   Ht 5' 2.75" (1.594 m) Comment: no shoes  Wt 187 lb 4 oz (84.9 kg)   SpO2 95%   BMI 33.43 kg/m   Body mass index is 33.43 kg/m.  Advanced Directives 01/24/2017 12/14/2015 06/09/2013 06/04/2013  Does Patient Have a Medical Advance Directive? No No Patient does not have advance directive Patient does not have advance directive  Would patient like information on creating a medical advance directive? No - Patient declined - - -  Pre-existing out of facility DNR order (yellow form or pink MOST form) - - No -    Tobacco Social History   Tobacco Use  Smoking Status Never Smoker  Smokeless Tobacco Never Used     Counseling given: No   Clinical Intake:  Pre-visit preparation completed: Yes  Pain : No/denies pain Pain Score: 0-No pain     Nutritional Status: BMI > 30  Obese Nutritional Risks: None Diabetes: Yes CBG done?: No Did pt. bring in CBG monitor from home?: No  How often do you need to have someone help you when you read instructions, pamphlets, or other written materials from your doctor or pharmacy?: 1 - Never What is the last grade level you completed in school?: 12th grade  Interpreter Needed?: No  Comments: pt lives with spouse Information entered by :: LPinson, LPN  Past Medical History:  Diagnosis Date  . Arthritis   . Diabetes mellitus    type II  . Early cataracts, bilateral    no sx yet   . Hyperlipidemia   . Hypertension   . Osteopenia    Past Surgical History:  Procedure Laterality Date  . CYSTOSCOPY  10-1999  .  FRACTURE SURGERY  2010   rib fx- from fall   . LAPAROSCOPIC CHOLECYSTECTOMY    . OOPHORECTOMY    . ORIF HUMERUS FRACTURE Left 06/09/2013   Procedure: OPEN REDUCTION INTERNAL FIXATION (ORIF) PROXIMAL HUMERUS FRACTURE;  Surgeon: Rozanna Box, MD;  Location: [facility];  Service: Orthopedics;  Laterality: Left;   Family History  Problem Relation Age of Onset  . Heart attack Mother   . Heart attack Brother   . Stroke Brother   . Depression Sister   . Colon cancer Neg Hx   . Stomach cancer Neg Hx   . Esophageal cancer Neg Hx   . Rectal cancer Neg Hx    Social History   Socioeconomic History  . Marital status: Married    Spouse name: None  . Number of children: 2  . Years of education: None  . Highest education level: None  Social Needs  . Financial resource strain: None  . Food insecurity - worry: None  . Food insecurity - inability: None  . Transportation needs - medical: None  . Transportation needs - non-medical: None  Occupational History  . Occupation: Forensic psychologist: [employer]  Tobacco Use  . Smoking status: Never Smoker  . Smokeless tobacco: Never Used  Substance and Sexual Activity  . Alcohol use: No    Alcohol/week: 0.0 oz  . Drug use:  No  . Sexual activity: No  Other Topics Concern  . None  Social History Narrative  . None    Outpatient Encounter Medications as of 01/24/2017  Medication Sig  . Ascorbic Acid (VITAMIN C) 1000 MG tablet Take 500 mg by mouth daily.   Marland Kitchen aspirin 81 MG EC tablet Take 81 mg by mouth daily.    . Calcium Carbonate-Vit D-Min (CALCIUM 1200 PO) Take 1 tablet by mouth 2 (two) times daily.  . Cholecalciferol (VITAMIN D) 2000 UNITS CAPS Take 2,000 Units by mouth daily.   Marland Kitchen CRANBERRY PO Take 4,200 Units by mouth daily.  Marland Kitchen gemfibrozil (LOPID) 600 MG tablet TAKE 1 TABLET BY MOUTH TWICE A DAY  . lisinopril (PRINIVIL,ZESTRIL) 5 MG tablet TAKE 1 TABLET BY MOUTH ONCE A DAY  . lovastatin (MEVACOR) 20 MG tablet TAKE 1 TABLET BY MOUTH  ONCE A DAY  . metFORMIN (GLUCOPHAGE) 500 MG tablet TAKE 1 TABLET BY MOUTH TWICE A DAY WITH A MEAL  . Omega-3 300 MG CAPS Take 300 mg by mouth 2 (two) times daily.   Marland Kitchen spironolactone (ALDACTONE) 25 MG tablet TAKE 1/2 TABLET BY MOUTH ONCE A DAY  . vitamin B-12 (CYANOCOBALAMIN) 1000 MCG tablet Take 1,000 mcg by mouth daily.  . [DISCONTINUED] acyclovir ointment (ZOVIRAX) 5 % Apply 1 application topically every 3 (three) hours. To affected areas   No facility-administered encounter medications on file as of 01/24/2017.     Activities of Daily Living In your present state of health, do you have any difficulty performing the following activities: 01/24/2017  Hearing? N  Vision? N  Difficulty concentrating or making decisions? N  Walking or climbing stairs? N  Dressing or bathing? N  Doing errands, shopping? N  Preparing Food and eating ? N  Using the Toilet? N  In the past six months, have you accidently leaked urine? N  Do you have problems with loss of bowel control? N  Managing your Medications? N  Managing your Finances? N  Housekeeping or managing your Housekeeping? N  Some recent data might be hidden    Patient Care Team: Tower, Wynelle Fanny, MD as PCP - General Jola Schmidt, MD as Consulting Physician (Ophthalmology) Ralene Bathe, MD as Consulting Physician (Dermatology) Arvella Nigh, MD as Consulting Physician (Obstetrics and Gynecology)    Assessment:   This is a routine wellness examination for Amy Fry.   Hearing Screening   125Hz  250Hz  500Hz  1000Hz  2000Hz  3000Hz  4000Hz  6000Hz  8000Hz   Right ear:   40 40 40  40    Left ear:   40 40 40  40    Vision Screening Comments: Last vision exam in Aug 2018 with Dr. Valetta Close    Exercise Activities and Dietary recommendations Current Exercise Habits: The patient does not participate in regular exercise at present, Exercise limited by: None identified  Goals    . DIET - INCREASE WATER INTAKE     Starting 01/24/2017, I will attempt to  drink at least 24 oz of water daily.        Fall Risk Fall Risk  01/24/2017 12/14/2015 12/08/2014 06/17/2012  Falls in the past year? No No No No   IDepression Screen PHQ 2/9 Scores 01/24/2017 12/14/2015 12/08/2014 06/17/2012  PHQ - 2 Score 0 0 0 0  PHQ- 9 Score 0 - - -     Cognitive Function MMSE - Mini Mental State Exam 01/24/2017 12/14/2015  Orientation to time 5 5  Orientation to Place 5 5  Registration 3 3  Attention/ Calculation 0 0  Recall 3 3  Language- name 2 objects 0 0  Language- repeat 1 1  Language- follow 3 step command 3 3  Language- read & follow direction 0 0  Write a sentence 0 0  Copy design 0 0  Total score 20 20     PLEASE NOTE: A Mini-Cog screen was completed. Maximum score is 20. A value of 0 denotes this part of [place] MMSE was not completed or the patient failed this part of the Mini-Cog screening.   Mini-Cog Screening Orientation to Time - Max 5 pts Orientation to Place - Max 5 pts Registration - Max 3 pts Recall - Max 3 pts Language Repeat - Max 1 pts Language Follow 3 Step Command - Max 3 pts     Immunization History  Administered Date(s) Administered  . Tdap 06/05/2011    Screening Tests Health Maintenance  Topic Date Due  . PNA vac Low Risk Adult (1 of 2 - PCV13) 02/17/2020 (Originally 10/18/2011)  . INFLUENZA VACCINE  12/13/2025 (Originally 08/15/2016)  . MAMMOGRAM  03/15/2017  . COLONOSCOPY  03/31/2017  . FOOT EXAM  06/20/2017  . HEMOGLOBIN A1C  07/24/2017  . OPHTHALMOLOGY EXAM  08/23/2017  . TETANUS/TDAP  06/04/2021  . DEXA SCAN  Completed  . Hepatitis C Screening  Completed       Plan:     I have personally reviewed, addressed, and noted the following in the patient's chart:  A. Medical and social history B. Use of alcohol, tobacco or illicit drugs  C. Current medications and supplements D. Functional ability and status E.  Nutritional status F.  Physical activity G. Advance directives H. List of other physicians I.    Hospitalizations, surgeries, and ER visits in previous 12 months J.  [place] to include hearing, vision, cognitive, depression L. Referrals and appointments - none  In addition, I have reviewed and discussed with patient certain preventive protocols, quality metrics, and best practice recommendations. A written personalized care plan for preventive services as well as general preventive health recommendations were provided to patient.  See attached scanned questionnaire for additional information.   Signed,   Lindell Noe, MHA, BS, LPN Health Coach

## 2017-01-24 NOTE — Patient Instructions (Signed)
Amy Fry , Thank you for taking time to come for your Medicare Wellness Visit. I appreciate your ongoing commitment to your health goals. Please review the following plan we discussed and let me know if I can assist you in the future.   These are the goals we discussed: Goals    . DIET - INCREASE WATER INTAKE     Starting 01/24/2017, I will attempt to drink at least 24 oz of water daily.        This is a list of the screening recommended for you and due dates:  Health Maintenance  Topic Date Due  . Pneumonia vaccines (1 of 2 - PCV13) 02/17/2020*  . Flu Shot  12/13/2025*  . Mammogram  03/15/2017  . Colon Cancer Screening  03/31/2017  . Complete foot exam   06/20/2017  . Hemoglobin A1C  07/24/2017  . Eye exam for diabetics  08/23/2017  . Tetanus Vaccine  06/04/2021  . DEXA scan (bone density measurement)  Completed  .  Hepatitis C: One time screening is recommended by Center for Disease Control  (CDC) for  adults born from [birth year] through 1964.   Completed  *Topic was postponed. The date shown is not the original due date.   Preventive Care for Adults  A healthy lifestyle and preventive care can promote health and wellness. Preventive health guidelines for adults include the following key practices.  . A routine yearly physical is a good way to check with your health care provider about your health and preventive screening. It is a chance to share any concerns and updates on your health and to receive a thorough exam.  . Visit your dentist for a routine exam and preventive care every 6 months. Brush your teeth twice a day and floss once a day. Good oral hygiene prevents tooth decay and gum disease.  . The frequency of eye exams is based on your age, health, family medical history, use  of contact lenses, and other factors. Follow your health care provider's recommendations for frequency of eye exams.  . Eat a healthy diet. Foods like vegetables, fruits, whole grains, low-fat dairy  products, and lean protein foods contain the nutrients you need without too many calories. Decrease your intake of foods high in solid fats, added sugars, and salt. Eat the right amount of calories for you. Get information about a proper diet from your health care provider, if necessary.  . Regular physical exercise is one of the most important things you can do for your health. Most adults should get at least 150 minutes of moderate-intensity exercise (any activity that increases your heart rate and causes you to sweat) each week. In addition, most adults need muscle-strengthening exercises on 2 or more days a week.  Silver Sneakers may be a benefit available to you. To determine eligibility, you may visit the website: www.silversneakers.com or contact program at 440-071-5497 Mon-Fri between 8AM-8PM.   . Maintain a healthy weight. The body mass index (BMI) is a screening tool to identify possible weight problems. It provides an estimate of body fat based on height and weight. Your health care provider can find your BMI and can help you achieve or maintain a healthy weight.   For adults 20 years and older: ? A BMI below 18.5 is considered underweight. ? A BMI of 18.5 to 24.9 is normal. ? A BMI of 25 to 29.9 is considered overweight. ? A BMI of 30 and above is considered obese.   . Maintain normal  blood lipids and cholesterol levels by exercising and minimizing your intake of saturated fat. Eat a balanced diet with plenty of fruit and vegetables. Blood tests for lipids and cholesterol should begin at age 63 and be repeated every 5 years. If your lipid or cholesterol levels are high, you are over 50, or you are at high risk for heart disease, you may need your cholesterol levels checked more frequently. Ongoing high lipid and cholesterol levels should be treated with medicines if diet and exercise are not working.  . If you smoke, find out from your health care provider how to quit. If you do not  use tobacco, please do not start.  . If you choose to drink alcohol, please do not consume more than 2 drinks per day. One drink is considered to be 12 ounces (355 mL) of beer, 5 ounces (148 mL) of wine, or 1.5 ounces (44 mL) of liquor.  . If you are 69-32 years old, ask your health care provider if you should take aspirin to prevent strokes.  . Use sunscreen. Apply sunscreen liberally and repeatedly throughout the day. You should seek shade when your shadow is shorter than you. Protect yourself by wearing long sleeves, pants, a wide-brimmed hat, and sunglasses year round, whenever you are outdoors.  . Once a month, do a whole body skin exam, using a mirror to look at the skin on your back. Tell your health care provider of new moles, moles that have irregular borders, moles that are larger than a pencil eraser, or moles that have changed in shape or color.

## 2017-01-27 NOTE — Progress Notes (Signed)
I reviewed health advisor's note, was available for consultation, and agree with documentation and plan.  

## 2017-01-28 ENCOUNTER — Ambulatory Visit (INDEPENDENT_AMBULATORY_CARE_PROVIDER_SITE_OTHER): Payer: PPO | Admitting: Family Medicine

## 2017-01-28 ENCOUNTER — Encounter: Payer: Self-pay | Admitting: Family Medicine

## 2017-01-28 VITALS — BP 116/70 | HR 72 | Temp 98.4°F | Ht 62.75 in | Wt 187.2 lb

## 2017-01-28 DIAGNOSIS — M8589 Other specified disorders of bone density and structure, multiple sites: Secondary | ICD-10-CM

## 2017-01-28 DIAGNOSIS — Z6832 Body mass index (BMI) 32.0-32.9, adult: Secondary | ICD-10-CM

## 2017-01-28 DIAGNOSIS — I10 Essential (primary) hypertension: Secondary | ICD-10-CM

## 2017-01-28 DIAGNOSIS — E559 Vitamin D deficiency, unspecified: Secondary | ICD-10-CM | POA: Diagnosis not present

## 2017-01-28 DIAGNOSIS — E7800 Pure hypercholesterolemia, unspecified: Secondary | ICD-10-CM

## 2017-01-28 DIAGNOSIS — E119 Type 2 diabetes mellitus without complications: Secondary | ICD-10-CM

## 2017-01-28 DIAGNOSIS — E6609 Other obesity due to excess calories: Secondary | ICD-10-CM

## 2017-01-28 DIAGNOSIS — Z0000 Encounter for general adult medical examination without abnormal findings: Secondary | ICD-10-CM | POA: Diagnosis not present

## 2017-01-28 MED ORDER — GEMFIBROZIL 600 MG PO TABS: 600.0000 mg | ORAL_TABLET | Freq: Two times a day (BID) | ORAL | 3 refills | Status: DC

## 2017-01-28 MED ORDER — METFORMIN HCL 500 MG PO TABS: ORAL_TABLET | ORAL | 3 refills | Status: DC

## 2017-01-28 MED ORDER — LISINOPRIL 5 MG PO TABS: 5.0000 mg | ORAL_TABLET | Freq: Every day | ORAL | 3 refills | Status: DC

## 2017-01-28 MED ORDER — SPIRONOLACTONE 25 MG PO TABS: ORAL_TABLET | ORAL | 3 refills | Status: DC

## 2017-01-28 NOTE — Assessment & Plan Note (Signed)
Discussed how this problem influences overall health and the risks it imposes  Reviewed plan for weight loss with lower calorie diet (via better food choices and also portion control or program like weight watchers) and exercise building up to or more than 30 minutes 5 days per week including some aerobic activity    

## 2017-01-28 NOTE — Assessment & Plan Note (Signed)
Will call for last dexa report from gyn No falls or fx (since humerus fx) Disc need for calcium/ vitamin D/ wt bearing exercise and bone density test every 2 y to monitor Disc safety/ fracture risk in detail   Intolerant of alendronate

## 2017-01-28 NOTE — Assessment & Plan Note (Signed)
bp in fair control at this time  BP Readings from Last 1 Encounters:  01/28/17 116/70   No changes needed Disc lifstyle change with low sodium diet and exercise  Labs reviewed

## 2017-01-28 NOTE — Assessment & Plan Note (Signed)
Lab Results  Component Value Date   HGBA1C 6.8 (H) 01/24/2017   Up slightly  Will continue metformin Disc low glycemic diet and exercise  Foot and eye care disc and up to date Ace for renal protection  F/u 6 mo

## 2017-01-28 NOTE — Assessment & Plan Note (Signed)
Vitamin D level is therapeutic with current supplementation Disc importance of this to bone and overall health  

## 2017-01-28 NOTE — Assessment & Plan Note (Signed)
Reviewed health habits including diet and exercise and skin cancer prevention Reviewed appropriate screening tests for age  Also reviewed health mt list, fam hx and immunization status , as well as social and family history   See HPI  Sent for last mammogram/dexa and pap reports from gyn  5 year recall colonoscopy due in march  Pt will f/u with gyn  amw reviewed Wt loss enc

## 2017-01-28 NOTE — Assessment & Plan Note (Signed)
Disc goals for lipids and reasons to control them Rev labs with pt Rev low sat fat diet in detail Continue lovastatin and gemfibrozil

## 2017-01-28 NOTE — Patient Instructions (Addendum)
You are due for a colonoscopy in 3/19 - let us know if you need a referral   Try to get most of your carbohydrates from produce (with the exception of white potatoes)  Eat less bread/pasta/rice/snack foods/cereals/sweets and other items from the middle of the grocery store (processed carbs) This will really help weight loss and diabetes Any extra exercise helps more    I refilled medicines for 90 day supply with refills   Take care of yourself !  Follow up in 6 months

## 2017-01-28 NOTE — Progress Notes (Signed)
Subjective:    Patient ID: Amy Fry, female    DOB: 01-08-1946, 72 y.o.   MRN: [id number]  HPI Here for health maintenance exam and to review chronic medical problems    Has been doing well overall Trying to take care of herself when she has time    Had amw 1/10  No gaps or concerns   Wt Readings from Last 3 Encounters:  01/28/17 187 lb 4 oz (84.9 kg)  01/24/17 187 lb 4 oz (84.9 kg)  06/20/16 191 lb 12 oz (87 kg)  down 4 lb since June  More active and less sweets  More housework for exercise - vacuum and dust  33.43 kg/m   Flu vaccine - declines  pna vaccines -declines   Mammogram 3/18- she thinks  Self breast exam -no breast lumps   Colonoscopy 3/14 with 5 year recall  She wants to get her referral from gyn instead of [place]   Eye exam 8/18  dexa 2/16 -osteopenia - unsure when she had her last one (will send for this from gyn) D level is 73-- taking her vitamin D  Humerus fx 5/15  Could not tolerate alendronate No falls    Sees gyn- has to make her yearly appt   bp is stable today  No cp or palpitations or headaches or edema  No side effects to medicines  BP Readings from Last 3 Encounters:  01/28/17 116/70  01/24/17 104/78  06/20/16 132/70      DM2 Lab Results  Component Value Date   HGBA1C 6.8 (H) 01/24/2017  inching up from 6.7 last time (has been well controlled)  No sweets at all  Too much bread/pasta/white potato  On metformin   Hyperlipidemia Lab Results  Component Value Date   CHOL 165 01/24/2017   CHOL 159 06/14/2016   CHOL 163 12/14/2015   Lab Results  Component Value Date   HDL 54.10 01/24/2017   HDL 66.30 06/14/2016   HDL 62.40 12/14/2015   Lab Results  Component Value Date   LDLCALC 79 01/24/2017   LDLCALC 75 06/14/2016   LDLCALC 78 12/14/2015   Lab Results  Component Value Date   TRIG 162.0 (H) 01/24/2017   TRIG 90.0 06/14/2016   TRIG 113.0 12/14/2015   Lab Results  Component Value Date   CHOLHDL 3  01/24/2017   CHOLHDL 2 06/14/2016   CHOLHDL 3 12/14/2015   No results found for: LDLDIRECT On mevacor and diet  Also gemfibrozil  Fairly well controlled with medication and diet   Lab Results  Component Value Date   WBC 7.2 01/24/2017   HGB 14.6 01/24/2017   HCT 43.4 01/24/2017   MCV 94.4 01/24/2017   PLT 314.0 01/24/2017    Lab Results  Component Value Date   CREATININE 0.94 01/24/2017   BUN 22 01/24/2017   NA 139 01/24/2017   K 4.6 01/24/2017   CL 103 01/24/2017   CO2 30 01/24/2017   glucose was 118  Lab Results  Component Value Date   TSH 1.22 01/24/2017     Patient Active Problem List   Diagnosis Date Noted  . Need for hepatitis C screening test 12/13/2015  . Obesity 12/11/2014  . Herpes simplex 03/16/2014  . Pedal edema 06/15/2013  . Constipation 06/15/2013  . History of fracture of arm 06/09/2013  . Encounter for Medicare annual wellness exam 06/17/2012  . Hypokalemia 06/17/2012  . Routine general medical examination at a health care facility 02/18/2011  . Vitamin  D deficiency 07/30/2008  . DERMATOPHYTOSIS, NAIL 07/26/2006  . Diabetes type 2, controlled ([place]) 07/25/2006  . HYPERCHOLESTEROLEMIA 07/25/2006  . Essential hypertension 07/25/2006  . Osteopenia 07/25/2006   Past Medical History:  Diagnosis Date  . Arthritis   . Diabetes mellitus    type II  . Early cataracts, bilateral    no sx yet   . Hyperlipidemia   . Hypertension   . Osteopenia    Past Surgical History:  Procedure Laterality Date  . CYSTOSCOPY  10-1999  . FRACTURE SURGERY  2010   rib fx- from fall   . LAPAROSCOPIC CHOLECYSTECTOMY    . OOPHORECTOMY    . ORIF HUMERUS FRACTURE Left 06/09/2013   Procedure: OPEN REDUCTION INTERNAL FIXATION (ORIF) PROXIMAL HUMERUS FRACTURE;  Surgeon: Rozanna Box, MD;  Location: [facility];  Service: Orthopedics;  Laterality: Left;   Social History   Tobacco Use  . Smoking status: Never Smoker  . Smokeless tobacco: Never Used  Substance Use Topics    . Alcohol use: No    Alcohol/week: 0.0 oz  . Drug use: No   Family History  Problem Relation Age of Onset  . Heart attack Mother   . Heart attack Brother   . Stroke Brother   . Depression Sister   . Colon cancer Neg Hx   . Stomach cancer Neg Hx   . Esophageal cancer Neg Hx   . Rectal cancer Neg Hx    Allergies  Allergen Reactions  . Alendronate Sodium     REACTION: reaction not known  . Atorvastatin     REACTION: headaches  . Pravastatin Sodium     REACTION: headaches and arm pain   Current Outpatient Medications on File Prior to Visit  Medication Sig Dispense Refill  . Ascorbic Acid (VITAMIN C) 1000 MG tablet Take 500 mg by mouth daily.     Marland Kitchen aspirin 81 MG EC tablet Take 81 mg by mouth daily.      . Calcium Carbonate-Vit D-Min (CALCIUM 1200 PO) Take 1 tablet by mouth 2 (two) times daily.    . Cholecalciferol (VITAMIN D) 2000 UNITS CAPS Take 2,000 Units by mouth daily.     Marland Kitchen CRANBERRY PO Take 4,200 Units by mouth daily.    Marland Kitchen lovastatin (MEVACOR) 20 MG tablet TAKE 1 TABLET BY MOUTH ONCE A DAY 30 tablet 5  . Omega-3 300 MG CAPS Take 300 mg by mouth 2 (two) times daily.     . vitamin B-12 (CYANOCOBALAMIN) 1000 MCG tablet Take 1,000 mcg by mouth daily.     No current facility-administered medications on file prior to visit.      Review of Systems  Constitutional: Negative for activity change, appetite change, fatigue, fever and unexpected weight change.  HENT: Negative for congestion, ear pain, rhinorrhea, sinus pressure and sore throat.   Eyes: Negative for pain, redness and visual disturbance.  Respiratory: Negative for cough, shortness of breath and wheezing.   Cardiovascular: Negative for chest pain and palpitations.  Gastrointestinal: Negative for abdominal pain, blood in stool, constipation and diarrhea.  Endocrine: Negative for polydipsia and polyuria.  Genitourinary: Negative for dysuria, frequency and urgency.  Musculoskeletal: Negative for arthralgias, back pain  and myalgias.  Skin: Negative for pallor and rash.  Allergic/Immunologic: Negative for environmental allergies.  Neurological: Negative for dizziness, syncope and headaches.  Hematological: Negative for adenopathy. Does not bruise/bleed easily.  Psychiatric/Behavioral: Negative for decreased concentration and dysphoric mood. The patient is not nervous/anxious.  Objective:   Physical Exam  Constitutional: She appears well-developed and well-nourished. No distress.  obese and well appearing   HENT:  Head: Normocephalic and atraumatic.  Right Ear: External ear normal.  Left Ear: External ear normal.  Mouth/Throat: Oropharynx is clear and moist.  Eyes: Conjunctivae and EOM are normal. Pupils are equal, round, and reactive to light. No scleral icterus.  Neck: Normal range of motion. Neck supple. No JVD present. Carotid bruit is not present. No thyromegaly present.  Cardiovascular: Normal rate, regular rhythm, normal heart sounds and intact distal pulses. Exam reveals no gallop.  Pulmonary/Chest: Effort normal and breath sounds normal. No respiratory distress. She has no wheezes. She exhibits no tenderness.  Abdominal: Soft. Bowel sounds are normal. She exhibits no distension, no abdominal bruit and no mass. There is no tenderness.  Genitourinary: No breast swelling, tenderness, discharge or bleeding.  Genitourinary Comments: Breast exam: No mass, nodules, thickening, tenderness, bulging, retraction, inflamation, nipple discharge or skin changes noted.  No axillary or clavicular LA.      Musculoskeletal: Normal range of motion. She exhibits no edema or tenderness.  No kyphosis   Lymphadenopathy:    She has no cervical adenopathy.  Neurological: She is alert. She has normal reflexes. No cranial nerve deficit. She exhibits normal muscle tone. Coordination normal.  Skin: Skin is warm and dry. No rash noted. No erythema. No pallor.  Solar lentigines diffusely   Psychiatric: She has a  normal mood and affect.          Assessment & Plan:   Problem List Items Addressed This Visit      Cardiovascular and Mediastinum   Essential hypertension    bp in fair control at this time  BP Readings from Last 1 Encounters:  01/28/17 116/70   No changes needed Disc lifstyle change with low sodium diet and exercise  Labs reviewed       Relevant Medications   spironolactone (ALDACTONE) 25 MG tablet   lisinopril (PRINIVIL,ZESTRIL) 5 MG tablet   gemfibrozil (LOPID) 600 MG tablet     Endocrine   Diabetes type 2, controlled ([place])    Lab Results  Component Value Date   HGBA1C 6.8 (H) 01/24/2017   Up slightly  Will continue metformin Disc low glycemic diet and exercise  Foot and eye care disc and up to date Ace for renal protection  F/u 6 mo      Relevant Medications   metFORMIN (GLUCOPHAGE) 500 MG tablet   lisinopril (PRINIVIL,ZESTRIL) 5 MG tablet     Musculoskeletal and Integument   Osteopenia    Will call for last dexa report from gyn No falls or fx (since humerus fx) Disc need for calcium/ vitamin D/ wt bearing exercise and bone density test every 2 y to monitor Disc safety/ fracture risk in detail   Intolerant of alendronate          Other   HYPERCHOLESTEROLEMIA    Disc goals for lipids and reasons to control them Rev labs with pt Rev low sat fat diet in detail Continue lovastatin and gemfibrozil        Relevant Medications   spironolactone (ALDACTONE) 25 MG tablet   lisinopril (PRINIVIL,ZESTRIL) 5 MG tablet   gemfibrozil (LOPID) 600 MG tablet   Obesity    Discussed how this problem influences overall health and the risks it imposes  Reviewed plan for weight loss with lower calorie diet (via better food choices and also portion control or program like weight watchers) and  exercise building up to or more than 30 minutes 5 days per week including some aerobic activity         Relevant Medications   metFORMIN (GLUCOPHAGE) 500 MG tablet    Routine general medical examination at a health care facility - Primary    Reviewed health habits including diet and exercise and skin cancer prevention Reviewed appropriate screening tests for age  Also reviewed health mt list, fam hx and immunization status , as well as social and family history   See HPI  Sent for last mammogram/dexa and pap reports from gyn  5 year recall colonoscopy due in march  Pt will f/u with gyn  amw reviewed Wt loss enc       Vitamin D deficiency    Vitamin D level is therapeutic with current supplementation Disc importance of this to bone and overall health

## 2017-03-31 ENCOUNTER — Encounter: Payer: Self-pay | Admitting: Gastroenterology

## 2017-04-16 ENCOUNTER — Encounter: Payer: Self-pay | Admitting: Gastroenterology

## 2017-05-24 ENCOUNTER — Other Ambulatory Visit: Payer: Self-pay | Admitting: Family Medicine

## 2017-06-06 ENCOUNTER — Ambulatory Visit (AMBULATORY_SURGERY_CENTER): Payer: Self-pay | Admitting: *Deleted

## 2017-06-06 ENCOUNTER — Other Ambulatory Visit: Payer: Self-pay

## 2017-06-06 VITALS — Ht 63.0 in | Wt 188.0 lb

## 2017-06-06 DIAGNOSIS — Z86010 Personal history of colonic polyps: Secondary | ICD-10-CM

## 2017-06-06 NOTE — Progress Notes (Signed)
Denies allergies to eggs or soy products. Denies complications with sedation or anesthesia. Denies O2 use. Denies use of diet or weight loss medications.  Emmi instructions not given for colonoscopy, pt does not use a computer.

## 2017-06-10 ENCOUNTER — Encounter: Payer: Self-pay | Admitting: Gastroenterology

## 2017-06-20 ENCOUNTER — Encounter: Payer: Self-pay | Admitting: Gastroenterology

## 2017-06-20 ENCOUNTER — Other Ambulatory Visit: Payer: Self-pay

## 2017-06-20 ENCOUNTER — Ambulatory Visit (AMBULATORY_SURGERY_CENTER): Payer: PPO | Admitting: Gastroenterology

## 2017-06-20 VITALS — BP 131/81 | HR 79 | Temp 97.8°F | Resp 13 | Ht 63.0 in | Wt 188.0 lb

## 2017-06-20 DIAGNOSIS — D123 Benign neoplasm of transverse colon: Secondary | ICD-10-CM

## 2017-06-20 DIAGNOSIS — Z86010 Personal history of colonic polyps: Secondary | ICD-10-CM

## 2017-06-20 DIAGNOSIS — E119 Type 2 diabetes mellitus without complications: Secondary | ICD-10-CM | POA: Diagnosis not present

## 2017-06-20 DIAGNOSIS — D124 Benign neoplasm of descending colon: Secondary | ICD-10-CM

## 2017-06-20 DIAGNOSIS — I10 Essential (primary) hypertension: Secondary | ICD-10-CM | POA: Diagnosis not present

## 2017-06-20 DIAGNOSIS — D122 Benign neoplasm of ascending colon: Secondary | ICD-10-CM | POA: Diagnosis not present

## 2017-06-20 MED ORDER — SODIUM CHLORIDE 0.9 % IV SOLN: 500.0000 mL | INTRAVENOUS | Status: DC

## 2017-06-20 NOTE — Progress Notes (Signed)
Called to room to assist during endoscopic procedure.  Patient ID and intended procedure confirmed with present staff. Received instructions for my participation in the procedure from the performing physician.  

## 2017-06-20 NOTE — Patient Instructions (Signed)
NO NSAIDS,IBUPROFEN, ASPIRIN, ALEVE FOR TWO WEEKS *HANDOUT ON POLYPS GIVEN *   YOU HAD AN ENDOSCOPIC PROCEDURE TODAY AT THE [facility]:   Refer to the procedure report that was given to you for any specific questions about what was found during the examination.  If the procedure report does not answer your questions, please call your gastroenterologist to clarify.  If you requested that your care partner not be given the details of your procedure findings, then the procedure report has been included in a sealed envelope for you to review at your convenience later.  YOU SHOULD EXPECT: Some feelings of bloating in the abdomen. Passage of more gas than usual.  Walking can help get rid of the air that was put into your GI tract during the procedure and reduce the bloating. If you had a lower endoscopy (such as a colonoscopy or flexible sigmoidoscopy) you may notice spotting of blood in your stool or on the toilet paper. If you underwent a bowel prep for your procedure, you may not have a normal bowel movement for a few days.  Please Note:  You might notice some irritation and congestion in your nose or some drainage.  This is from the oxygen used during your procedure.  There is no need for concern and it should clear up in a day or so.  SYMPTOMS TO REPORT IMMEDIATELY:   Following lower endoscopy (colonoscopy or flexible sigmoidoscopy):  Excessive amounts of blood in the stool  Significant tenderness or worsening of abdominal pains  Swelling of the abdomen that is new, acute  Fever of 100F or higher    For urgent or emergent issues, a gastroenterologist can be reached at any hour by calling [phone].   DIET:  We do recommend a small meal at first, but then you may proceed to your regular diet.  Drink plenty of fluids but you should avoid alcoholic beverages for 24 hours.  ACTIVITY:  You should plan to take it easy for the rest of today and you should NOT DRIVE or use heavy  machinery until tomorrow (because of the sedation medicines used during the test).    FOLLOW UP: Our staff will call the number listed on your records the next business day following your procedure to check on you and address any questions or concerns that you may have regarding the information given to you following your procedure. If we do not reach you, we will leave a message.  However, if you are feeling well and you are not experiencing any problems, there is no need to return our call.  We will assume that you have returned to your regular daily activities without incident.  If any biopsies were taken you will be contacted by phone or by letter within the next 1-3 weeks.  Please call us at [phone] if you have not heard about the biopsies in 3 weeks.    SIGNATURES/CONFIDENTIALITY: You and/or your care partner have signed paperwork which will be entered into your electronic medical record.  These signatures attest to the fact that that the information above on your After Visit Summary has been reviewed and is understood.  Full responsibility of the confidentiality of this discharge information lies with you and/or your care-partner.

## 2017-06-20 NOTE — Op Note (Addendum)
[facility] Patient Name: Amy Fry Procedure Date: 06/20/2017 8:55 AM MRN: [id number] Endoscopist: Remo Lipps P. Havery Moros , MD Age: 72 Referring MD:  Date of Birth: Jun 01, 1945 Gender: Female Account #: [id number] Procedure:                Colonoscopy Indications:              Surveillance: Personal history of adenomatous                            polyps on last colonoscopy 5 years ago Medicines:                Monitored Anesthesia Care Procedure:                Pre-Anesthesia Assessment:                           - Prior to the procedure, a History and Physical                            was performed, and patient medications and                            allergies were reviewed. The patient's tolerance of                            previous anesthesia was also reviewed. The risks                            and benefits of the procedure and the sedation                            options and risks were discussed with the patient.                            All questions were answered, and informed consent                            was obtained. Prior Anticoagulants: The patient has                            taken no previous anticoagulant or antiplatelet                            agents. ASA Grade Assessment: II - A patient with                            mild systemic disease. After reviewing the risks                            and benefits, the patient was deemed in                            satisfactory condition to undergo the procedure.  After obtaining informed consent, the colonoscope                            was passed under direct vision. Throughout the                            procedure, the patient's blood pressure, pulse, and                            oxygen saturations were monitored continuously. The                            Colonoscope was introduced through the anus and                            advanced to the the  cecum, identified by                            appendiceal orifice and ileocecal valve. The                            colonoscopy was performed without difficulty. The                            patient tolerated the procedure well. The quality                            of the bowel preparation was good. The ileocecal                            valve, appendiceal orifice, and rectum were                            photographed. Scope In: 9:03:37 AM Scope Out: 9:28:41 AM Scope Withdrawal Time: 0 hours 17 minutes 8 seconds  Total Procedure Duration: 0 hours 25 minutes 4 seconds  Findings:                 The perianal and digital rectal examinations were                            normal.                           A 4 mm polyp was found in the ascending colon. The                            polyp was sessile. The polyp was removed with a                            cold snare. Resection and retrieval were complete.                           A 3 mm polyp was found in the transverse colon. The  polyp was sessile. The polyp was removed with a                            cold snare. Resection and retrieval were complete.                           A 8 mm polyp was found in the descending colon. The                            polyp was semi-pedunculated. The polyp was removed                            with a hot snare. Resection and retrieval were                            complete.                           The exam was otherwise without abnormality. Complications:            Patient had some coughing and transient oxygen                            desaturation managed by anesthesia, nebulizer given                            post procedure. Estimated blood loss: Minimal. Estimated Blood Loss:     Estimated blood loss was minimal. Impression:               - One 4 mm polyp in the ascending colon, removed                            with a cold snare. Resected and  retrieved.                           - One 3 mm polyp in the transverse colon, removed                            with a cold snare. Resected and retrieved.                           - One 8 mm polyp in the descending colon, removed                            with a hot snare. Resected and retrieved.                           - The examination was otherwise normal. Recommendation:           - Patient has a contact number available for                            emergencies. The signs and symptoms of potential  delayed complications were discussed with the                            patient. Return to normal activities tomorrow.                            Written discharge instructions were provided to the                            patient.                           - Resume previous diet.                           - Continue present medications.                           - Await pathology results.                           - No ibuprofen, naproxen, or other non-steroidal                            anti-inflammatory drugs for 2 weeks after polyp                            removal. Remo Lipps P. Xan Sparkman, MD 06/20/2017 9:33:35 AM This report has been signed electronically.

## 2017-06-20 NOTE — Progress Notes (Signed)
0912 O2 sat down to mid 60s, with jaw thrust performed, large amount of secretions noted, robinul given with Dr Havery Moros updated, vss

## 2017-06-20 NOTE — Progress Notes (Signed)
Report given to PACU, vss 

## 2017-06-21 ENCOUNTER — Telehealth: Payer: Self-pay

## 2017-06-21 NOTE — Telephone Encounter (Signed)
  Follow up Call-  Call back number 06/20/2017  Post procedure Call Back phone  # [phone]  Permission to leave phone message Yes  Some recent data might be hidden     Patient questions:  Do you have a fever, pain , or abdominal swelling? No. Pain Score  0 *  Have you tolerated food without any problems? Yes.    Have you been able to return to your normal activities? Yes.    Do you have any questions about your discharge instructions: Diet   No. Medications  No. Follow up visit  No.  Do you have questions or concerns about your Care? No.  Actions: * If pain score is 4 or above: No action needed, pain <4.

## 2017-06-27 DIAGNOSIS — Z6833 Body mass index (BMI) 33.0-33.9, adult: Secondary | ICD-10-CM | POA: Diagnosis not present

## 2017-06-27 DIAGNOSIS — Z01419 Encounter for gynecological examination (general) (routine) without abnormal findings: Secondary | ICD-10-CM | POA: Diagnosis not present

## 2017-06-27 DIAGNOSIS — Z1231 Encounter for screening mammogram for malignant neoplasm of breast: Secondary | ICD-10-CM | POA: Diagnosis not present

## 2017-07-01 ENCOUNTER — Encounter: Payer: Self-pay | Admitting: Gastroenterology

## 2017-07-15 ENCOUNTER — Telehealth: Payer: Self-pay | Admitting: Family Medicine

## 2017-07-15 DIAGNOSIS — E119 Type 2 diabetes mellitus without complications: Secondary | ICD-10-CM

## 2017-07-15 DIAGNOSIS — I10 Essential (primary) hypertension: Secondary | ICD-10-CM

## 2017-07-15 DIAGNOSIS — E7800 Pure hypercholesterolemia, unspecified: Secondary | ICD-10-CM

## 2017-07-15 NOTE — Telephone Encounter (Signed)
-----   Message from Ellamae Sia sent at 07/10/2017  3:31 PM EDT ----- Regarding: Lab orders for Thursday, 7.11.19 .Lab orders for a 6 month follow up appt

## 2017-07-25 ENCOUNTER — Other Ambulatory Visit (INDEPENDENT_AMBULATORY_CARE_PROVIDER_SITE_OTHER): Payer: PPO

## 2017-07-25 DIAGNOSIS — E119 Type 2 diabetes mellitus without complications: Secondary | ICD-10-CM | POA: Diagnosis not present

## 2017-07-25 DIAGNOSIS — E7800 Pure hypercholesterolemia, unspecified: Secondary | ICD-10-CM

## 2017-07-25 DIAGNOSIS — I10 Essential (primary) hypertension: Secondary | ICD-10-CM

## 2017-07-25 LAB — COMPREHENSIVE METABOLIC PANEL
ALBUMIN: 4.2 g/dL (ref 3.5–5.2)
ALK PHOS: 75 U/L (ref 39–117)
ALT: 14 U/L (ref 0–35)
AST: 17 U/L (ref 0–37)
BUN: 15 mg/dL (ref 6–23)
CALCIUM: 10 mg/dL (ref 8.4–10.5)
CHLORIDE: 104 meq/L (ref 96–112)
CO2: 28 mEq/L (ref 19–32)
CREATININE: 0.92 mg/dL (ref 0.40–1.20)
GFR: 64 mL/min (ref >60.00)
Glucose, Bld: 131 mg/dL — ABNORMAL HIGH (ref 70–99)
Potassium: 4.3 mEq/L (ref 3.5–5.1)
Sodium: 141 mEq/L (ref 135–145)
Total Bilirubin: 0.4 mg/dL (ref 0.2–1.2)
Total Protein: 6.9 g/dL (ref 6.0–8.3)

## 2017-07-25 LAB — LIPID PANEL
Cholesterol: 149 mg/dL (ref 0–200)
HDL: 52.9 mg/dL (ref >39.00)
LDL Cholesterol: 74 mg/dL (ref 0–99)
NONHDL: 96.18
TRIGLYCERIDES: 113 mg/dL (ref 0.0–149.0)
Total CHOL/HDL Ratio: 3
VLDL: 22.6 mg/dL (ref 0.0–40.0)

## 2017-07-25 LAB — HEMOGLOBIN A1C: Hgb A1c MFr Bld: 6.7 % — ABNORMAL HIGH (ref 4.6–6.5)

## 2017-07-29 ENCOUNTER — Ambulatory Visit (INDEPENDENT_AMBULATORY_CARE_PROVIDER_SITE_OTHER): Payer: PPO | Admitting: Family Medicine

## 2017-07-29 ENCOUNTER — Encounter: Payer: Self-pay | Admitting: Family Medicine

## 2017-07-29 VITALS — BP 130/72 | HR 70 | Temp 98.2°F | Ht 62.75 in | Wt 187.2 lb

## 2017-07-29 DIAGNOSIS — I10 Essential (primary) hypertension: Secondary | ICD-10-CM | POA: Diagnosis not present

## 2017-07-29 DIAGNOSIS — E119 Type 2 diabetes mellitus without complications: Secondary | ICD-10-CM | POA: Diagnosis not present

## 2017-07-29 DIAGNOSIS — E7800 Pure hypercholesterolemia, unspecified: Secondary | ICD-10-CM | POA: Diagnosis not present

## 2017-07-29 MED ORDER — LISINOPRIL 5 MG PO TABS: 5.0000 mg | ORAL_TABLET | Freq: Every day | ORAL | 3 refills | Status: DC

## 2017-07-29 MED ORDER — GEMFIBROZIL 600 MG PO TABS: 600.0000 mg | ORAL_TABLET | Freq: Two times a day (BID) | ORAL | 3 refills | Status: DC

## 2017-07-29 MED ORDER — METFORMIN HCL 500 MG PO TABS: ORAL_TABLET | ORAL | 3 refills | Status: DC

## 2017-07-29 MED ORDER — LOVASTATIN 20 MG PO TABS: 20.0000 mg | ORAL_TABLET | Freq: Every day | ORAL | 3 refills | Status: DC

## 2017-07-29 MED ORDER — SPIRONOLACTONE 25 MG PO TABS: ORAL_TABLET | ORAL | 3 refills | Status: DC

## 2017-07-29 NOTE — Patient Instructions (Addendum)
We will send for your mammogram report   Keep taking good care of yourself  Keep working on diet and exercise   Follow up in 6 months for annual exam

## 2017-07-29 NOTE — Assessment & Plan Note (Signed)
Disc goals for lipids and reasons to control them Rev last labs with pt Rev low sat fat diet in detail Controlled with lovastatin and diet

## 2017-07-29 NOTE — Assessment & Plan Note (Signed)
Lab Results  Component Value Date   HGBA1C 6.7 (H) 07/25/2017   Stable with metformin  For eye exam next month  Good foot care  Disc low glycemic diet

## 2017-07-29 NOTE — Progress Notes (Addendum)
Subjective:    Patient ID: Amy Fry, female    DOB: 08-16-1945, 72 y.o.   MRN: [id number]  HPI Here for 6 mo f/u of chronic medical problems   Doing ok overall   Had her mammogram in June -nl  Goes to Dr Radene Knee  Will get a dexa as well   colonoscopy- adenoma , 5 y recall    Wt Readings from Last 3 Encounters:  07/29/17 187 lb 4 oz (84.9 kg)  06/20/17 188 lb (85.3 kg)  06/06/17 188 lb (85.3 kg)   33.43 kg/m   bp is stable today  No cp or palpitations or headaches or edema  No side effects to medicines  BP Readings from Last 3 Encounters:  07/29/17 130/72  06/20/17 131/81  01/28/17 116/70     DM2 Lab Results  Component Value Date   HGBA1C 6.7 (H) 07/25/2017   This is down from 6.8 Takes metformin -no problems  No high or low glucose readings  Foot and eye care -ok Eye exam due next mo- she has it scheduled  Ace for renal protection    Hyperlipidemia Lab Results  Component Value Date   CHOL 149 07/25/2017   CHOL 165 01/24/2017   CHOL 159 06/14/2016   Lab Results  Component Value Date   HDL 52.90 07/25/2017   HDL 54.10 01/24/2017   HDL 66.30 06/14/2016   Lab Results  Component Value Date   LDLCALC 74 07/25/2017   LDLCALC 79 01/24/2017   LDLCALC 75 06/14/2016   Lab Results  Component Value Date   TRIG 113.0 07/25/2017   TRIG 162.0 (H) 01/24/2017   TRIG 90.0 06/14/2016   Lab Results  Component Value Date   CHOLHDL 3 07/25/2017   CHOLHDL 3 01/24/2017   CHOLHDL 2 06/14/2016   No results found for: LDLDIRECT Lovastatin and diet  She is watching her diet - eating more from the garden       Chemistry      Component Value Date/Time   NA 141 07/25/2017 0845   K 4.3 07/25/2017 0845   CL 104 07/25/2017 0845   CO2 28 07/25/2017 0845   BUN 15 07/25/2017 0845   CREATININE 0.92 07/25/2017 0845      Component Value Date/Time   CALCIUM 10.0 07/25/2017 0845   CALCIUM 8.9 06/11/2013 0615   ALKPHOS 75 07/25/2017 0845   AST 17  07/25/2017 0845   ALT 14 07/25/2017 0845   BILITOT 0.4 07/25/2017 0845      Review of Systems  Constitutional: Negative for activity change, appetite change, fatigue, fever and unexpected weight change.  HENT: Negative for congestion, ear pain, rhinorrhea, sinus pressure and sore throat.   Eyes: Negative for pain, redness and visual disturbance.  Respiratory: Negative for cough, shortness of breath and wheezing.   Cardiovascular: Negative for chest pain and palpitations.  Gastrointestinal: Negative for abdominal pain, blood in stool, constipation and diarrhea.  Endocrine: Negative for polydipsia and polyuria.  Genitourinary: Negative for dysuria, frequency and urgency.  Musculoskeletal: Negative for arthralgias, back pain and myalgias.  Skin: Negative for pallor and rash.  Allergic/Immunologic: Negative for environmental allergies.  Neurological: Negative for dizziness, syncope and headaches.  Hematological: Negative for adenopathy. Does not bruise/bleed easily.  Psychiatric/Behavioral: Negative for decreased concentration and dysphoric mood. The patient is not nervous/anxious.        Objective:   Physical Exam  Constitutional: She appears well-developed and well-nourished. No distress.  obese and well appearing   HENT:  Head: Normocephalic and atraumatic.  Right Ear: External ear normal.  Left Ear: External ear normal.  Mouth/Throat: Oropharynx is clear and moist.  Eyes: Pupils are equal, round, and reactive to light. Conjunctivae and EOM are normal. No scleral icterus.  Neck: Normal range of motion. Neck supple. No JVD present. Carotid bruit is not present. No thyromegaly present.  Cardiovascular: Normal rate, regular rhythm, normal heart sounds and intact distal pulses. Exam reveals no gallop.  Pulmonary/Chest: Effort normal and breath sounds normal. No respiratory distress. She has no wheezes. She exhibits no tenderness. No breast tenderness, discharge or bleeding.  Abdominal:  Soft. Bowel sounds are normal. She exhibits no distension, no abdominal bruit and no mass. There is no tenderness.  Genitourinary: No breast tenderness, discharge or bleeding.  Musculoskeletal: Normal range of motion. She exhibits no edema or tenderness.  No kyphosis   Lymphadenopathy:    She has no cervical adenopathy.  Neurological: She is alert. She has normal reflexes. She displays normal reflexes. No cranial nerve deficit. She exhibits normal muscle tone. Coordination normal.  Skin: Skin is warm and dry. No rash noted. No erythema. No pallor.  Psychiatric: She has a normal mood and affect.  pleasant          Assessment & Plan:   Problem List Items Addressed This Visit      Cardiovascular and Mediastinum   Essential hypertension    bp in fair control at this time  BP Readings from Last 1 Encounters:  07/29/17 130/72   No changes needed Most recent labs reviewed  Disc lifstyle change with low sodium diet and exercise        Relevant Medications   gemfibrozil (LOPID) 600 MG tablet   lisinopril (PRINIVIL,ZESTRIL) 5 MG tablet   lovastatin (MEVACOR) 20 MG tablet   spironolactone (ALDACTONE) 25 MG tablet     Endocrine   Diabetes type 2, controlled ([place]) - Primary    Lab Results  Component Value Date   HGBA1C 6.7 (H) 07/25/2017   Stable with metformin  For eye exam next month  Good foot care  Disc low glycemic diet       Relevant Medications   lisinopril (PRINIVIL,ZESTRIL) 5 MG tablet   lovastatin (MEVACOR) 20 MG tablet   metFORMIN (GLUCOPHAGE) 500 MG tablet     Other   HYPERCHOLESTEROLEMIA    Disc goals for lipids and reasons to control them Rev last labs with pt Rev low sat fat diet in detail Controlled with lovastatin and diet      Relevant Medications   gemfibrozil (LOPID) 600 MG tablet   lisinopril (PRINIVIL,ZESTRIL) 5 MG tablet   lovastatin (MEVACOR) 20 MG tablet   spironolactone (ALDACTONE) 25 MG tablet

## 2017-07-29 NOTE — Assessment & Plan Note (Signed)
bp in fair control at this time  BP Readings from Last 1 Encounters:  07/29/17 130/72   No changes needed Most recent labs reviewed  Disc lifstyle change with low sodium diet and exercise

## 2017-08-30 LAB — HM DIABETES EYE EXAM

## 2017-09-05 DIAGNOSIS — Z13820 Encounter for screening for osteoporosis: Secondary | ICD-10-CM | POA: Diagnosis not present

## 2017-09-05 DIAGNOSIS — M818 Other osteoporosis without current pathological fracture: Secondary | ICD-10-CM | POA: Diagnosis not present

## 2017-09-12 ENCOUNTER — Encounter: Payer: Self-pay | Admitting: Family Medicine

## 2017-09-26 ENCOUNTER — Encounter (HOSPITAL_COMMUNITY): Payer: Self-pay

## 2017-09-26 ENCOUNTER — Ambulatory Visit (HOSPITAL_COMMUNITY)
Admission: EM | Admit: 2017-09-26 | Discharge: 2017-09-26 | Disposition: A | Discharge status: Home / Self Care | Payer: PPO | Attending: Family Medicine | Admitting: Family Medicine

## 2017-09-26 DIAGNOSIS — B9789 Other viral agents as the cause of diseases classified elsewhere: Secondary | ICD-10-CM

## 2017-09-26 DIAGNOSIS — J069 Acute upper respiratory infection, unspecified: Secondary | ICD-10-CM | POA: Diagnosis not present

## 2017-09-26 MED ORDER — AMOXICILLIN-POT CLAVULANATE 875-125 MG PO TABS: 1.0000 | ORAL_TABLET | Freq: Two times a day (BID) | ORAL | 0 refills | Status: AC

## 2017-09-26 MED ORDER — BENZONATATE 200 MG PO CAPS: 200.0000 mg | ORAL_CAPSULE | Freq: Three times a day (TID) | ORAL | 0 refills | Status: AC | PRN

## 2017-09-26 MED ORDER — FLUTICASONE PROPIONATE 50 MCG/ACT NA SUSP: 1.0000 | Freq: Every day | NASAL | 0 refills | Status: DC

## 2017-09-26 NOTE — ED Provider Notes (Signed)
[facility]    CSN: [id number] Arrival date & time: 09/26/17  1152     History   Chief Complaint Chief Complaint  Patient presents with  . URI    HPI Amy Fry is a 72 y.o. female history of hypertension, hyperlipidemia, DM type II; Patient is presenting with URI symptoms- congestion, cough, sore throat. Patient's main complaints are concern for this moving into her chest. Symptoms have been going on for 3 to 4 days. Patient has tried daily allergy pill, over-the-counter cold and sinus medicine, with minimal relief. Denies fever, nausea, vomiting, diarrhea. Denies shortness of breath and chest pain.  Denies history of asthma and smoking.  Patient is concerned that she is going on vacation for the next week to the beach.   HPI  Past Medical History:  Diagnosis Date  . Arthritis   . Diabetes mellitus    type II  . Early cataracts, bilateral    no sx yet   . Hyperlipidemia   . Hypertension   . Osteopenia     Patient Active Problem List   Diagnosis Date Noted  . Need for hepatitis C screening test 12/13/2015  . Obesity 12/11/2014  . Herpes simplex 03/16/2014  . Pedal edema 06/15/2013  . Constipation 06/15/2013  . History of fracture of arm 06/09/2013  . Encounter for Medicare annual wellness exam 06/17/2012  . Hypokalemia 06/17/2012  . Routine general medical examination at a health care facility 02/18/2011  . Vitamin D deficiency 07/30/2008  . DERMATOPHYTOSIS, NAIL 07/26/2006  . Diabetes type 2, controlled ([place]) 07/25/2006  . HYPERCHOLESTEROLEMIA 07/25/2006  . Essential hypertension 07/25/2006  . Osteopenia 07/25/2006    Past Surgical History:  Procedure Laterality Date  . CYSTOSCOPY  10-1999  . FRACTURE SURGERY  2010   rib fx- from fall   . LAPAROSCOPIC CHOLECYSTECTOMY    . OOPHORECTOMY    . ORIF HUMERUS FRACTURE Left 06/09/2013   Procedure: OPEN REDUCTION INTERNAL FIXATION (ORIF) PROXIMAL HUMERUS FRACTURE;  Surgeon: Rozanna Box, MD;   Location: [facility];  Service: Orthopedics;  Laterality: Left;    OB History   None      Home Medications    Prior to Admission medications   Medication Sig Start Date End Date Taking? Authorizing Provider  amoxicillin-clavulanate (AUGMENTIN) 875-125 MG tablet Take 1 tablet by mouth every 12 (twelve) hours for 7 days. 09/26/17 10/03/17  Wieters, Hallie C, PA-C  Ascorbic Acid (VITAMIN C) 1000 MG tablet Take 500 mg by mouth daily.     [provider]  aspirin 81 MG EC tablet Take 81 mg by mouth daily.      [provider]  benzonatate (TESSALON) 200 MG capsule Take 1 capsule (200 mg total) by mouth 3 (three) times daily as needed for up to 7 days for cough. 09/26/17 10/03/17  Wieters, Hallie C, PA-C  Calcium Carbonate-Vit D-Min (CALCIUM 1200 PO) Take 1 tablet by mouth 2 (two) times daily.    [provider]  Cholecalciferol (VITAMIN D) 2000 UNITS CAPS Take 2,000 Units by mouth daily.     [provider]  CRANBERRY PO Take 4,200 Units by mouth daily.    [provider]  fluticasone (FLONASE) 50 MCG/ACT nasal spray Place 1-2 sprays into both nostrils daily for 7 days. 09/26/17 10/03/17  Wieters, Hallie C, PA-C  gemfibrozil (LOPID) 600 MG tablet Take 1 tablet (600 mg total) by mouth 2 (two) times daily. 07/29/17   Tower, Wynelle Fanny, MD  lisinopril (PRINIVIL,ZESTRIL)  5 MG tablet Take 1 tablet (5 mg total) by mouth daily. 07/29/17   Tower, Wynelle Fanny, MD  lovastatin (MEVACOR) 20 MG tablet Take 1 tablet (20 mg total) by mouth daily. 07/29/17   Tower, Wynelle Fanny, MD  metFORMIN (GLUCOPHAGE) 500 MG tablet TAKE 1 TABLET BY MOUTH TWICE A DAY WITH A MEAL 07/29/17   Tower, Wynelle Fanny, MD  Omega-3 300 MG CAPS Take 300 mg by mouth 2 (two) times daily.     [provider]  spironolactone (ALDACTONE) 25 MG tablet TAKE 1/2 TABLET BY MOUTH ONCE A DAY 07/29/17   Tower, Wynelle Fanny, MD  vitamin B-12 (CYANOCOBALAMIN) 1000 MCG tablet Take 1,000 mcg by mouth daily.    [provider]     Family History Family History  Problem Relation Age of Onset  . Heart attack Mother   . Heart attack Brother   . Stroke Brother   . Depression Sister   . Colon cancer Neg Hx   . Stomach cancer Neg Hx   . Esophageal cancer Neg Hx   . Rectal cancer Neg Hx     Social History Social History   Tobacco Use  . Smoking status: Former Research scientist (life sciences)  . Smokeless tobacco: Never Used  . Tobacco comment: a little as a teenager  Substance Use Topics  . Alcohol use: No    Alcohol/week: 0.0 standard drinks  . Drug use: No     Allergies   Alendronate sodium; Atorvastatin; and Pravastatin sodium   Review of Systems Review of Systems  Constitutional: Negative for activity change, appetite change, chills, fatigue and fever.  HENT: Positive for congestion, rhinorrhea, sinus pressure and sore throat. Negative for ear pain and trouble swallowing.   Eyes: Negative for discharge and redness.  Respiratory: Positive for cough. Negative for chest tightness and shortness of breath.   Cardiovascular: Negative for chest pain.  Gastrointestinal: Negative for abdominal pain, diarrhea, nausea and vomiting.  Musculoskeletal: Negative for myalgias.  Skin: Negative for rash.  Neurological: Negative for dizziness, light-headedness and headaches.     Physical Exam Triage Vital Signs ED Triage Vitals [09/26/17 1217]  Enc Vitals Group     BP (!) 145/75     Pulse Rate 73     Resp 18     Temp 98.5 F (36.9 C)     Temp Source Oral     SpO2 98 %     Weight      Height      Head Circumference      Peak Flow      Pain Score      Pain Loc      Pain Edu?      Excl. in [place]?    No data found.  Updated Vital Signs BP (!) 145/75 (BP Location: Left Arm)   Pulse 73   Temp 98.5 F (36.9 C) (Oral)   Resp 18   SpO2 98%   Visual Acuity Right Eye Distance:   Left Eye Distance:   Bilateral Distance:    Right Eye Near:   Left Eye Near:    Bilateral Near:     Physical Exam  Constitutional: She  appears well-developed and well-nourished. No distress.  HENT:  Head: Normocephalic and atraumatic.  Bilateral ears without tenderness to palpation of external auricle, tragus and mastoid, EAC's without erythema or swelling, TM's with good bony landmarks and cone of light. Non erythematous, but does appear to have a small amount of fluid bilaterally  Oral mucosa  pink and moist, no tonsillar enlargement or exudate. Posterior pharynx patent and nonerythematous, no uvula deviation or swelling.  Slightly congested phonation.  Eyes: Conjunctivae are normal.  Neck: Neck supple.  Cardiovascular: Normal rate and regular rhythm.  No murmur heard. Pulmonary/Chest: Effort normal and breath sounds normal. No respiratory distress.  Breathing comfortably at rest, CTABL, no wheezing, rales or other adventitious sounds auscultated  Abdominal: Soft. There is no tenderness.  Musculoskeletal: She exhibits no edema.  Neurological: She is alert.  Skin: Skin is warm and dry.  Psychiatric: She has a normal mood and affect.  Nursing note and vitals reviewed.    UC Treatments / Results  Labs (all labs ordered are listed, but only abnormal results are displayed) Labs Reviewed - No data to display  EKG None  Radiology No results found.  Procedures Procedures (including critical care time)  Medications Ordered in UC Medications - No data to display  Initial Impression / Assessment and Plan / UC Course  I have reviewed the triage vital signs and the nursing notes.  Pertinent labs & imaging results that were available during my care of the patient were reviewed by me and considered in my medical decision making (see chart for details).     Patient with URI symptoms, 3 to 4 days, vital signs stable, exam unremarkable, likely viral cause, recommended continuing symptomatic management, will add in Flonase to continue with allergy pill, Tessalon for cough.  Given patient going on vacation for the next  week, provided Prescription for Augmentin to treat for sinusitis if symptoms persisting throughout the end of the weekend without any improvement.Discussed strict return precautions. Patient verbalized understanding and is agreeable with plan.  Final Clinical Impressions(s) / UC Diagnoses   Final diagnoses:  Viral URI with cough     Discharge Instructions     Your symptoms are most likely viral in likely need 3-4 more days to improve Continue your daily allergy pill, please add in Flonase to better control your congestion You may use Tessalon as needed for cough every 8 hours  You may fill prescription for Augmentin if you are not having any improvement with further symptomatic treatment in the next 3 days  Please follow-up if developing fevers, worsening symptoms, not improving with treatment above, shortness of breath, difficulty breathing or wheezing   ED Prescriptions    Medication Sig Dispense Auth. Provider   fluticasone (FLONASE) 50 MCG/ACT nasal spray Place 1-2 sprays into both nostrils daily for 7 days. 1 g Wieters, Hallie C, PA-C   benzonatate (TESSALON) 200 MG capsule Take 1 capsule (200 mg total) by mouth 3 (three) times daily as needed for up to 7 days for cough. 28 capsule Wieters, Hallie C, PA-C   amoxicillin-clavulanate (AUGMENTIN) 875-125 MG tablet Take 1 tablet by mouth every 12 (twelve) hours for 7 days. 14 tablet Wieters, Arnold C, PA-C     Controlled Substance Prescriptions [place] Controlled Substance Registry consulted? Not Applicable   Janith Lima, [place] 09/26/17 1241

## 2017-09-26 NOTE — Discharge Instructions (Signed)
Your symptoms are most likely viral in likely need 3-4 more days to improve Continue your daily allergy pill, please add in Flonase to better control your congestion You may use Tessalon as needed for cough every 8 hours  You may fill prescription for Augmentin if you are not having any improvement with further symptomatic treatment in the next 3 days  Please follow-up if developing fevers, worsening symptoms, not improving with treatment above, shortness of breath, difficulty breathing or wheezing

## 2017-09-26 NOTE — ED Triage Notes (Signed)
Pt presents with cold symptoms, chest congestion, cough and sore throat.

## 2017-11-20 ENCOUNTER — Ambulatory Visit (INDEPENDENT_AMBULATORY_CARE_PROVIDER_SITE_OTHER): Payer: PPO

## 2017-11-20 ENCOUNTER — Ambulatory Visit (HOSPITAL_COMMUNITY)
Admission: EM | Admit: 2017-11-20 | Discharge: 2017-11-20 | Disposition: A | Discharge status: Home / Self Care | Payer: PPO | Attending: Family Medicine | Admitting: Family Medicine

## 2017-11-20 ENCOUNTER — Encounter (HOSPITAL_COMMUNITY): Payer: Self-pay

## 2017-11-20 DIAGNOSIS — R05 Cough: Secondary | ICD-10-CM

## 2017-11-20 DIAGNOSIS — R0981 Nasal congestion: Secondary | ICD-10-CM | POA: Diagnosis not present

## 2017-11-20 DIAGNOSIS — J181 Lobar pneumonia, unspecified organism: Secondary | ICD-10-CM

## 2017-11-20 DIAGNOSIS — J189 Pneumonia, unspecified organism: Secondary | ICD-10-CM

## 2017-11-20 DIAGNOSIS — R0789 Other chest pain: Secondary | ICD-10-CM

## 2017-11-20 MED ORDER — PREDNISONE 50 MG PO TABS: 50.0000 mg | ORAL_TABLET | Freq: Every day | ORAL | 0 refills | Status: AC

## 2017-11-20 MED ORDER — AZITHROMYCIN 250 MG PO TABS: 250.0000 mg | ORAL_TABLET | Freq: Every day | ORAL | 0 refills | Status: DC

## 2017-11-20 MED ORDER — GUAIFENESIN-DM 100-10 MG/5ML PO SYRP: 5.0000 mL | ORAL_SOLUTION | ORAL | 0 refills | Status: DC | PRN

## 2017-11-20 MED ORDER — BENZONATATE 200 MG PO CAPS: 200.0000 mg | ORAL_CAPSULE | Freq: Three times a day (TID) | ORAL | 0 refills | Status: AC | PRN

## 2017-11-20 NOTE — Discharge Instructions (Signed)
Please begin taking azithromycin, 2 tablets today, 1 tablet for the following 4 days Please use Tessalon every 8 hours as needed for cough  May continue with Mucinex to help with congestion and drainage  Please follow-up in 4 to 5 days if not having any improvement or sooner if discomfort worsening or developing fever, worsening chest discomfort.

## 2017-11-20 NOTE — ED Triage Notes (Signed)
Pt presents with upper respiratory symptoms; congestion, persistent cough and discomfort in chest.

## 2017-11-21 DIAGNOSIS — Z85828 Personal history of other malignant neoplasm of skin: Secondary | ICD-10-CM | POA: Diagnosis not present

## 2017-11-21 DIAGNOSIS — E119 Type 2 diabetes mellitus without complications: Secondary | ICD-10-CM | POA: Diagnosis not present

## 2017-11-21 DIAGNOSIS — L905 Scar conditions and fibrosis of skin: Secondary | ICD-10-CM | POA: Diagnosis not present

## 2017-11-21 NOTE — ED Provider Notes (Signed)
[place]    CSN: [id number] Arrival date & time: 11/20/17  1433     History   Chief Complaint Chief Complaint  Patient presents with  . URI    HPI Amy Fry is a 72 y.o. female history of hypertension, hyperlipidemia, DM type II presenting today for evaluation of URI symptoms.  Patient states that she has had cough and congestion for the past few weeks.  Symptoms have been persistent.  She denies associated sore throat.  Over the past few days she has developed a tightness and discomfort in her left lower chest.  She denies any nausea or vomiting.  She has tried Mucinex which has mildly helped.  Denies fevers.  HPI  Past Medical History:  Diagnosis Date  . Arthritis   . Diabetes mellitus    type II  . Early cataracts, bilateral    no sx yet   . Hyperlipidemia   . Hypertension   . Osteopenia     Patient Active Problem List   Diagnosis Date Noted  . Need for hepatitis C screening test 12/13/2015  . Obesity 12/11/2014  . Herpes simplex 03/16/2014  . Pedal edema 06/15/2013  . Constipation 06/15/2013  . History of fracture of arm 06/09/2013  . Encounter for Medicare annual wellness exam 06/17/2012  . Hypokalemia 06/17/2012  . Routine general medical examination at a health care facility 02/18/2011  . Vitamin D deficiency 07/30/2008  . DERMATOPHYTOSIS, NAIL 07/26/2006  . Diabetes type 2, controlled ([place]) 07/25/2006  . HYPERCHOLESTEROLEMIA 07/25/2006  . Essential hypertension 07/25/2006  . Osteopenia 07/25/2006    Past Surgical History:  Procedure Laterality Date  . CYSTOSCOPY  10-1999  . FRACTURE SURGERY  2010   rib fx- from fall   . LAPAROSCOPIC CHOLECYSTECTOMY    . OOPHORECTOMY    . ORIF HUMERUS FRACTURE Left 06/09/2013   Procedure: OPEN REDUCTION INTERNAL FIXATION (ORIF) PROXIMAL HUMERUS FRACTURE;  Surgeon: Rozanna Box, MD;  Location: [facility];  Service: Orthopedics;  Laterality: Left;    OB History   None      Home Medications      Prior to Admission medications   Medication Sig Start Date End Date Taking? Authorizing Provider  Ascorbic Acid (VITAMIN C) 1000 MG tablet Take 500 mg by mouth daily.     [provider]  aspirin 81 MG EC tablet Take 81 mg by mouth daily.      [provider]  azithromycin (ZITHROMAX) 250 MG tablet Take 1 tablet (250 mg total) by mouth daily. Take first 2 tablets together, then 1 every day until finished. 11/20/17   Wieters, Hallie C, PA-C  benzonatate (TESSALON) 200 MG capsule Take 1 capsule (200 mg total) by mouth 3 (three) times daily as needed for up to 7 days for cough. 11/20/17 11/27/17  Wieters, Hallie C, PA-C  Calcium Carbonate-Vit D-Min (CALCIUM 1200 PO) Take 1 tablet by mouth 2 (two) times daily.    [provider]  Cholecalciferol (VITAMIN D) 2000 UNITS CAPS Take 2,000 Units by mouth daily.     [provider]  CRANBERRY PO Take 4,200 Units by mouth daily.    [provider]  fluticasone (FLONASE) 50 MCG/ACT nasal spray Place 1-2 sprays into both nostrils daily for 7 days. 09/26/17 10/03/17  Wieters, Hallie C, PA-C  gemfibrozil (LOPID) 600 MG tablet Take 1 tablet (600 mg total) by mouth 2 (two) times daily. 07/29/17   Tower, Wynelle Fanny, MD  guaiFENesin-dextromethorphan (ROBITUSSIN DM) 100-10 MG/5ML  syrup Take 5 mLs by mouth every 4 (four) hours as needed for cough. 11/20/17   Wieters, Hallie C, PA-C  lisinopril (PRINIVIL,ZESTRIL) 5 MG tablet Take 1 tablet (5 mg total) by mouth daily. 07/29/17   Tower, Wynelle Fanny, MD  lovastatin (MEVACOR) 20 MG tablet Take 1 tablet (20 mg total) by mouth daily. 07/29/17   Tower, Wynelle Fanny, MD  metFORMIN (GLUCOPHAGE) 500 MG tablet TAKE 1 TABLET BY MOUTH TWICE A DAY WITH A MEAL 07/29/17   Tower, Wynelle Fanny, MD  Omega-3 300 MG CAPS Take 300 mg by mouth 2 (two) times daily.     [provider]  predniSONE (DELTASONE) 50 MG tablet Take 1 tablet (50 mg total) by mouth daily for 5 days. 11/20/17 11/25/17  Wieters, Hallie C,  PA-C  spironolactone (ALDACTONE) 25 MG tablet TAKE 1/2 TABLET BY MOUTH ONCE A DAY 07/29/17   Tower, Wynelle Fanny, MD  vitamin B-12 (CYANOCOBALAMIN) 1000 MCG tablet Take 1,000 mcg by mouth daily.    [provider]    Family History Family History  Problem Relation Age of Onset  . Heart attack Mother   . Heart attack Brother   . Stroke Brother   . Depression Sister   . Colon cancer Neg Hx   . Stomach cancer Neg Hx   . Esophageal cancer Neg Hx   . Rectal cancer Neg Hx     Social History Social History   Tobacco Use  . Smoking status: Former Research scientist (life sciences)  . Smokeless tobacco: Never Used  . Tobacco comment: a little as a teenager  Substance Use Topics  . Alcohol use: No    Alcohol/week: 0.0 standard drinks  . Drug use: No     Allergies   Alendronate sodium; Atorvastatin; and Pravastatin sodium   Review of Systems Review of Systems  Constitutional: Negative for activity change, appetite change, chills, fatigue and fever.  HENT: Positive for congestion and rhinorrhea. Negative for ear pain, sinus pressure, sore throat and trouble swallowing.   Eyes: Negative for discharge and redness.  Respiratory: Positive for cough. Negative for chest tightness and shortness of breath.   Cardiovascular: Positive for chest pain.  Gastrointestinal: Negative for abdominal pain, diarrhea, nausea and vomiting.  Musculoskeletal: Negative for myalgias.  Skin: Negative for rash.  Neurological: Negative for dizziness, light-headedness and headaches.     Physical Exam Triage Vital Signs ED Triage Vitals  Enc Vitals Group     BP 11/20/17 1607 131/69     Pulse Rate 11/20/17 1607 68     Resp 11/20/17 1607 18     Temp 11/20/17 1607 98.1 F (36.7 C)     Temp Source 11/20/17 1607 Oral     SpO2 11/20/17 1607 96 %     Weight --      Height --      Head Circumference --      Peak Flow --      Pain Score 11/20/17 1608 5     Pain Loc --      Pain Edu? --      Excl. in [place]? --    No data  found.  Updated Vital Signs BP 131/69 (BP Location: Left Arm)   Pulse 68   Temp 98.1 F (36.7 C) (Oral)   Resp 18   SpO2 96%   Visual Acuity Right Eye Distance:   Left Eye Distance:   Bilateral Distance:    Right Eye Near:   Left Eye Near:    Bilateral  Near:     Physical Exam  Constitutional: She appears well-developed and well-nourished. No distress.  HENT:  Head: Normocephalic and atraumatic.  Bilateral ears without tenderness to palpation of external auricle, tragus and mastoid, EAC's without erythema or swelling, TM's with good bony landmarks and cone of light. Non erythematous.  Oral mucosa pink and moist, no tonsillar enlargement or exudate. Posterior pharynx patent and nonerythematous, no uvula deviation or swelling. Normal phonation.  Eyes: Conjunctivae are normal.  Neck: Neck supple.  Cardiovascular: Normal rate and regular rhythm.  No murmur heard. Pulmonary/Chest: Effort normal and breath sounds normal. No respiratory distress.  Breathing comfortably at rest, CTABL, no wheezing, rales or other adventitious sounds auscultated  Abdominal: Soft. There is no tenderness.  Musculoskeletal: She exhibits no edema.  Neurological: She is alert.  Skin: Skin is warm and dry.  Psychiatric: She has a normal mood and affect.  Nursing note and vitals reviewed.    UC Treatments / Results  Labs (all labs ordered are listed, but only abnormal results are displayed) Labs Reviewed - No data to display  EKG None  Radiology Dg Chest 2 View  Result Date: 11/20/2017 CLINICAL DATA:  Cough. EXAM: CHEST - 2 VIEW COMPARISON:  06/09/2013 FINDINGS: There is peribronchial thickening with slight linear atelectasis at the lung bases anteriorly. No consolidative infiltrates or effusions. Heart size and vascularity are normal. No acute bone abnormality. IMPRESSION: Bronchitic changes with minimal linear atelectasis at the lung bases anteriorly. Electronically Signed   By: Lorriane Shire  M.D.   On: 11/20/2017 16:51    Procedures Procedures (including critical care time)  Medications Ordered in UC Medications - No data to display  Initial Impression / Assessment and Plan / UC Course  I have reviewed the triage vital signs and the nursing notes.  Pertinent labs & imaging results that were available during my care of the patient were reviewed by me and considered in my medical decision making (see chart for details).    Chest x-ray obtained given length of symptoms and left chest discomfort, atelectasis called on x-ray, appears to be obscuring left heart border, given clinical correlation will go ahead and treat this as a possible early infiltrate with azithromycin.  Provided prednisone for cough.  Patient states that Lavella Lemons has not helped her in the past.  Discussed further over-the-counter cough syrups as alternatives.Discussed strict return precautions. Patient verbalized understanding and is agreeable with plan.   Final Clinical Impressions(s) / UC Diagnoses   Final diagnoses:  Community acquired pneumonia of left lung, unspecified part of lung     Discharge Instructions     Please begin taking azithromycin, 2 tablets today, 1 tablet for the following 4 days Please use Tessalon every 8 hours as needed for cough  May continue with Mucinex to help with congestion and drainage  Please follow-up in 4 to 5 days if not having any improvement or sooner if discomfort worsening or developing fever, worsening chest discomfort.   ED Prescriptions    Medication Sig Dispense Auth. Provider   azithromycin (ZITHROMAX) 250 MG tablet Take 1 tablet (250 mg total) by mouth daily. Take first 2 tablets together, then 1 every day until finished. 6 tablet Wieters, Hallie C, PA-C   benzonatate (TESSALON) 200 MG capsule Take 1 capsule (200 mg total) by mouth 3 (three) times daily as needed for up to 7 days for cough. 28 capsule Wieters, Hallie C, PA-C   predniSONE (DELTASONE) 50 MG  tablet Take 1 tablet (50 mg  total) by mouth daily for 5 days. 5 tablet Wieters, Hallie C, PA-C   guaiFENesin-dextromethorphan (ROBITUSSIN DM) 100-10 MG/5ML syrup Take 5 mLs by mouth every 4 (four) hours as needed for cough. 118 mL Wieters, Hallie C, PA-C     Controlled Substance Prescriptions [place] Controlled Substance Registry consulted? Not Applicable   Janith Lima, PA-C 11/21/17 1000

## 2018-01-30 ENCOUNTER — Ambulatory Visit (INDEPENDENT_AMBULATORY_CARE_PROVIDER_SITE_OTHER): Payer: PPO

## 2018-01-30 VITALS — BP 112/78 | HR 61 | Temp 97.7°F | Ht 63.0 in | Wt 179.5 lb

## 2018-01-30 DIAGNOSIS — Z0000 Encounter for general adult medical examination without abnormal findings: Secondary | ICD-10-CM | POA: Diagnosis not present

## 2018-01-30 DIAGNOSIS — I10 Essential (primary) hypertension: Secondary | ICD-10-CM | POA: Diagnosis not present

## 2018-01-30 DIAGNOSIS — E7800 Pure hypercholesterolemia, unspecified: Secondary | ICD-10-CM | POA: Diagnosis not present

## 2018-01-30 DIAGNOSIS — E119 Type 2 diabetes mellitus without complications: Secondary | ICD-10-CM | POA: Diagnosis not present

## 2018-01-30 DIAGNOSIS — E559 Vitamin D deficiency, unspecified: Secondary | ICD-10-CM | POA: Diagnosis not present

## 2018-01-30 LAB — COMPREHENSIVE METABOLIC PANEL
ALBUMIN: 4.4 g/dL (ref 3.5–5.2)
ALK PHOS: 55 U/L (ref 39–117)
ALT: 14 U/L (ref 0–35)
AST: 17 U/L (ref 0–37)
BUN: 17 mg/dL (ref 6–23)
CO2: 28 mEq/L (ref 19–32)
Calcium: 10.2 mg/dL (ref 8.4–10.5)
Chloride: 105 mEq/L (ref 96–112)
Creatinine, Ser: 0.96 mg/dL (ref 0.40–1.20)
GFR: 60.84 mL/min (ref >60.00)
Glucose, Bld: 123 mg/dL — ABNORMAL HIGH (ref 70–99)
POTASSIUM: 4.8 meq/L (ref 3.5–5.1)
Sodium: 139 mEq/L (ref 135–145)
TOTAL PROTEIN: 7 g/dL (ref 6.0–8.3)
Total Bilirubin: 0.5 mg/dL (ref 0.2–1.2)

## 2018-01-30 LAB — CBC WITH DIFFERENTIAL/PLATELET
BASOS ABS: 0.1 10*3/uL (ref 0.0–0.1)
Basophils Relative: 1.2 % (ref 0.0–3.0)
EOS ABS: 0.1 10*3/uL (ref 0.0–0.7)
EOS PCT: 2.5 % (ref 0.0–5.0)
HCT: 41.8 % (ref 36.0–46.0)
Hemoglobin: 14.4 g/dL (ref 12.0–15.0)
Lymphocytes Relative: 43.2 % (ref 12.0–46.0)
Lymphs Abs: 1.8 10*3/uL (ref 0.7–4.0)
MCHC: 34.4 g/dL (ref 30.0–36.0)
MCV: 92.1 fl (ref 78.0–100.0)
MONOS PCT: 10.9 % (ref 3.0–12.0)
Monocytes Absolute: 0.5 10*3/uL (ref 0.1–1.0)
NEUTROS PCT: 42.2 % — AB (ref 43.0–77.0)
Neutro Abs: 1.7 10*3/uL (ref 1.4–7.7)
PLATELETS: 322 10*3/uL (ref 150.0–400.0)
RBC: 4.54 Mil/uL (ref 3.87–5.11)
RDW: 13.9 % (ref 11.5–15.5)
WBC: 4.1 10*3/uL (ref 4.0–10.5)

## 2018-01-30 LAB — LIPID PANEL
Cholesterol: 147 mg/dL (ref 0–200)
HDL: 54.4 mg/dL (ref >39.00)
LDL Cholesterol: 66 mg/dL (ref 0–99)
NonHDL: 92.55
TRIGLYCERIDES: 132 mg/dL (ref 0.0–149.0)
Total CHOL/HDL Ratio: 3
VLDL: 26.4 mg/dL (ref 0.0–40.0)

## 2018-01-30 LAB — TSH: TSH: 1.12 u[IU]/mL (ref 0.35–4.50)

## 2018-01-30 LAB — HEMOGLOBIN A1C: Hgb A1c MFr Bld: 6.7 % — ABNORMAL HIGH (ref 4.6–6.5)

## 2018-01-30 LAB — VITAMIN D 25 HYDROXY (VIT D DEFICIENCY, FRACTURES): VITD: 60.02 ng/mL (ref 30.00–100.00)

## 2018-01-30 NOTE — Progress Notes (Signed)
Subjective:   Amy Fry is a 73 y.o. female who presents for Medicare Annual (Subsequent) preventive examination.  Review of Systems:  N/A Cardiac Risk Factors include: advanced age (>30men, >87 women);diabetes mellitus;dyslipidemia;hypertension;obesity (BMI >30kg/m2)     Objective:     Vitals: BP 112/78 (BP Location: Right Arm, Patient Position: Sitting, Cuff Size: Normal)   Pulse 61   Temp 97.7 F (36.5 C) (Oral)   Ht 5\' 3"  (1.6 m) Comment: no shoes  Wt 179 lb 8 oz (81.4 kg)   SpO2 98%   BMI 31.80 kg/m   Body mass index is 31.8 kg/m.  Advanced Directives 06/20/2017 01/24/2017 12/14/2015 06/09/2013 06/04/2013  Does Patient Have a Medical Advance Directive? No No No Patient does not have advance directive Patient does not have advance directive  Would patient like information on creating a medical advance directive? - No - Patient declined - - -  Pre-existing out of facility DNR order (yellow form or pink MOST form) - - - No -    Tobacco Social History   Tobacco Use  Smoking Status Former Smoker  Smokeless Tobacco Never Used  Tobacco Comment   a little as a teenager     Counseling given: No Comment: a little as a teenager   Clinical Intake:  Pre-visit preparation completed: Yes  Pain : No/denies pain Pain Score: 0-No pain     Nutritional Status: BMI > 30  Obese Nutritional Risks: None Diabetes: Yes CBG done?: No Did pt. bring in CBG monitor from home?: No  How often do you need to have someone help you when you read instructions, pamphlets, or other written materials from your doctor or pharmacy?: 1 - Never What is the last grade level you completed in school?: 12th grade  Interpreter Needed?: No  Comments: pt lives with spouse Information entered by :: LPinson, LPN  Past Medical History:  Diagnosis Date  . Arthritis   . Diabetes mellitus    type II  . Early cataracts, bilateral    no sx yet   . Hyperlipidemia   . Hypertension   .  Osteopenia    Past Surgical History:  Procedure Laterality Date  . CYSTOSCOPY  10-1999  . FRACTURE SURGERY  2010   rib fx- from fall   . LAPAROSCOPIC CHOLECYSTECTOMY    . OOPHORECTOMY    . ORIF HUMERUS FRACTURE Left 06/09/2013   Procedure: OPEN REDUCTION INTERNAL FIXATION (ORIF) PROXIMAL HUMERUS FRACTURE;  Surgeon: Rozanna Box, MD;  Location: [facility];  Service: Orthopedics;  Laterality: Left;   Family History  Problem Relation Age of Onset  . Heart attack Mother   . Heart attack Brother   . Stroke Brother   . Depression Sister   . Colon cancer Neg Hx   . Stomach cancer Neg Hx   . Esophageal cancer Neg Hx   . Rectal cancer Neg Hx    Social History   Socioeconomic History  . Marital status: Married    Spouse name: Not on file  . Number of children: 2  . Years of education: Not on file  . Highest education level: Not on file  Occupational History  . Occupation: Forensic psychologist: [employer]  . Financial resource strain: Not on file  . Food insecurity:    Worry: Not on file    Inability: Not on file  . Transportation needs:    Medical: Not on file    Non-medical: Not on  file  Tobacco Use  . Smoking status: Former Research scientist (life sciences)  . Smokeless tobacco: Never Used  . Tobacco comment: a little as a teenager  Substance and Sexual Activity  . Alcohol use: No    Alcohol/week: 0.0 standard drinks  . Drug use: No  . Sexual activity: Never  Lifestyle  . Physical activity:    Days per week: Not on file    Minutes per session: Not on file  . Stress: Not on file  Relationships  . Social connections:    Talks on phone: Not on file    Gets together: Not on file    Attends religious service: Not on file    Active member of club or organization: Not on file    Attends meetings of clubs or organizations: Not on file    Relationship status: Not on file  Other Topics Concern  . Not on file  Social History Narrative  . Not on file    Outpatient Encounter  Medications as of 01/30/2018  Medication Sig  . Ascorbic Acid (VITAMIN C) 1000 MG tablet Take 500 mg by mouth daily.   Marland Kitchen aspirin 81 MG EC tablet Take 81 mg by mouth daily.    . Calcium Carbonate-Vit D-Min (CALCIUM 1200 PO) Take 1 tablet by mouth 2 (two) times daily.  . Cholecalciferol (VITAMIN D) 2000 UNITS CAPS Take 2,000 Units by mouth daily.   Marland Kitchen CRANBERRY PO Take 4,200 Units by mouth daily.  Marland Kitchen gemfibrozil (LOPID) 600 MG tablet Take 1 tablet (600 mg total) by mouth 2 (two) times daily.  Marland Kitchen ibandronate (BONIVA) 150 MG tablet   . lisinopril (PRINIVIL,ZESTRIL) 5 MG tablet Take 1 tablet (5 mg total) by mouth daily.  Marland Kitchen lovastatin (MEVACOR) 20 MG tablet Take 1 tablet (20 mg total) by mouth daily.  . metFORMIN (GLUCOPHAGE) 500 MG tablet TAKE 1 TABLET BY MOUTH TWICE A DAY WITH A MEAL  . Omega-3 300 MG CAPS Take 300 mg by mouth 2 (two) times daily.   Marland Kitchen spironolactone (ALDACTONE) 25 MG tablet TAKE 1/2 TABLET BY MOUTH ONCE A DAY  . vitamin B-12 (CYANOCOBALAMIN) 1000 MCG tablet Take 1,000 mcg by mouth daily.  . [DISCONTINUED] azithromycin (ZITHROMAX) 250 MG tablet Take 1 tablet (250 mg total) by mouth daily. Take first 2 tablets together, then 1 every day until finished.  . [DISCONTINUED] fluticasone (FLONASE) 50 MCG/ACT nasal spray Place 1-2 sprays into both nostrils daily for 7 days.  . [DISCONTINUED] guaiFENesin-dextromethorphan (ROBITUSSIN DM) 100-10 MG/5ML syrup Take 5 mLs by mouth every 4 (four) hours as needed for cough.   Facility-Administered Encounter Medications as of 01/30/2018  Medication  . 0.9 %  sodium chloride infusion    Activities of Daily Living In your present state of health, do you have any difficulty performing the following activities: 01/30/2018  Hearing? N  Vision? N  Difficulty concentrating or making decisions? N  Walking or climbing stairs? N  Dressing or bathing? N  Doing errands, shopping? N  Preparing Food and eating ? N  Using the Toilet? N  In the past six  months, have you accidently leaked urine? N  Do you have problems with loss of bowel control? N  Managing your Medications? N  Managing your Finances? N  Housekeeping or managing your Housekeeping? N  Some recent data might be hidden    Patient Care Team: Tower, Wynelle Fanny, MD as PCP - General Jola Schmidt, MD as Consulting Physician (Ophthalmology) Ralene Bathe, MD as Consulting Physician (Dermatology)  Arvella Nigh, MD as Consulting Physician (Obstetrics and Gynecology)    Assessment:   This is a routine wellness examination for [place].   Hearing Screening   125Hz  250Hz  500Hz  1000Hz  2000Hz  3000Hz  4000Hz  6000Hz  8000Hz   Right ear:   40 40 40  40    Left ear:   40 40 40  40    Vision Screening Comments: Vision exam in August 2019 with Dr. Valetta Close   Exercise Activities and Dietary recommendations Current Exercise Habits: The patient does not participate in regular exercise at present, Exercise limited by: None identified  Goals    . Patient Stated     Starting 01/30/2018, I will continue to take medications as prescribed.        Fall Risk Fall Risk  01/30/2018 01/24/2017 12/14/2015 12/08/2014 06/17/2012  Falls in the past year? 0 No No No No    Depression Screen PHQ 2/9 Scores 01/30/2018 01/24/2017 12/14/2015 12/08/2014  PHQ - 2 Score 0 0 0 0  PHQ- 9 Score 0 0 - -     Cognitive Function MMSE - Mini Mental State Exam 01/30/2018 01/24/2017 12/14/2015  Orientation to time 5 5 5   Orientation to Place 5 5 5   Registration 3 3 3   Attention/ Calculation 0 0 0  Recall 3 3 3   Language- name 2 objects 0 0 0  Language- repeat 1 1 1   Language- follow 3 step command 3 3 3   Language- read & follow direction 0 0 0  Write a sentence 0 0 0  Copy design 0 0 0  Total score 20 20 20      PLEASE NOTE: A Mini-Cog screen was completed. Maximum score is 20. A value of 0 denotes this part of [place] MMSE was not completed or the patient failed this part of the Mini-Cog screening.   Mini-Cog  Screening Orientation to Time - Max 5 pts Orientation to Place - Max 5 pts Registration - Max 3 pts Recall - Max 3 pts Language Repeat - Max 1 pts Language Follow 3 Step Command - Max 3 pts     Immunization History  Administered Date(s) Administered  . Tdap 06/05/2011    Screening Tests Health Maintenance  Topic Date Due  . PNA vac Low Risk Adult (1 of 2 - PCV13) 02/17/2020 (Originally 10/18/2011)  . INFLUENZA VACCINE  12/13/2025 (Originally 08/15/2017)  . MAMMOGRAM  03/16/2018  . FOOT EXAM  07/30/2018  . HEMOGLOBIN A1C  07/31/2018  . OPHTHALMOLOGY EXAM  08/31/2018  . TETANUS/TDAP  06/04/2021  . COLONOSCOPY  06/21/2022  . DEXA SCAN  Completed  . Hepatitis C Screening  Completed     Plan:     I have personally reviewed, addressed, and noted the following in the patient's chart:  A. Medical and social history B. Use of alcohol, tobacco or illicit drugs  C. Current medications and supplements D. Functional ability and status E.  Nutritional status F.  Physical activity G. Advance directives H. List of other physicians I.  Hospitalizations, surgeries, and ER visits in previous 12 months J.  [place] to include hearing, vision, cognitive, depression L. Referrals and appointments - none  In addition, I have reviewed and discussed with patient certain preventive protocols, quality metrics, and best practice recommendations. A written personalized care plan for preventive services as well as general preventive health recommendations were provided to patient.  See attached scanned questionnaire for additional information.   Signed,   Lindell Noe, MHA, BS, LPN Health Coach

## 2018-01-30 NOTE — Patient Instructions (Signed)
Ms. Riehl , Thank you for taking time to come for your Medicare Wellness Visit. I appreciate your ongoing commitment to your health goals. Please review the following plan we discussed and let me know if I can assist you in the future.   These are the goals we discussed: Goals    . Patient Stated     Starting 01/30/2018, I will continue to take medications as prescribed.        This is a list of the screening recommended for you and due dates:  Health Maintenance  Topic Date Due  . Pneumonia vaccines (1 of 2 - PCV13) 02/17/2020*  . Flu Shot  12/13/2025*  . Mammogram  03/16/2018  . Complete foot exam   07/30/2018  . Hemoglobin A1C  07/31/2018  . Eye exam for diabetics  08/31/2018  . Tetanus Vaccine  06/04/2021  . Colon Cancer Screening  06/21/2022  . DEXA scan (bone density measurement)  Completed  .  Hepatitis C: One time screening is recommended by Center for Disease Control  (CDC) for  adults born from [birth year] through 1964.   Completed  *Topic was postponed. The date shown is not the original due date.   Preventive Care for Adults  A healthy lifestyle and preventive care can promote health and wellness. Preventive health guidelines for adults include the following key practices.  . A routine yearly physical is a good way to check with your health care provider about your health and preventive screening. It is a chance to share any concerns and updates on your health and to receive a thorough exam.  . Visit your dentist for a routine exam and preventive care every 6 months. Brush your teeth twice a day and floss once a day. Good oral hygiene prevents tooth decay and gum disease.  . The frequency of eye exams is based on your age, health, family medical history, use  of contact lenses, and other factors. Follow your health care provider's recommendations for frequency of eye exams.  . Eat a healthy diet. Foods like vegetables, fruits, whole grains, low-fat dairy products, and  lean protein foods contain the nutrients you need without too many calories. Decrease your intake of foods high in solid fats, added sugars, and salt. Eat the right amount of calories for you. Get information about a proper diet from your health care provider, if necessary.  . Regular physical exercise is one of the most important things you can do for your health. Most adults should get at least 150 minutes of moderate-intensity exercise (any activity that increases your heart rate and causes you to sweat) each week. In addition, most adults need muscle-strengthening exercises on 2 or more days a week.  Silver Sneakers may be a benefit available to you. To determine eligibility, you may visit the website: www.silversneakers.com or contact program at 908-304-6666 Mon-Fri between 8AM-8PM.   . Maintain a healthy weight. The body mass index (BMI) is a screening tool to identify possible weight problems. It provides an estimate of body fat based on height and weight. Your health care provider can find your BMI and can help you achieve or maintain a healthy weight.   For adults 20 years and older: ? A BMI below 18.5 is considered underweight. ? A BMI of 18.5 to 24.9 is normal. ? A BMI of 25 to 29.9 is considered overweight. ? A BMI of 30 and above is considered obese.   . Maintain normal blood lipids and cholesterol levels by exercising  and minimizing your intake of saturated fat. Eat a balanced diet with plenty of fruit and vegetables. Blood tests for lipids and cholesterol should begin at age 85 and be repeated every 5 years. If your lipid or cholesterol levels are high, you are over 50, or you are at high risk for heart disease, you may need your cholesterol levels checked more frequently. Ongoing high lipid and cholesterol levels should be treated with medicines if diet and exercise are not working.  . If you smoke, find out from your health care provider how to quit. If you do not use tobacco,  please do not start.  . If you choose to drink alcohol, please do not consume more than 2 drinks per day. One drink is considered to be 12 ounces (355 mL) of beer, 5 ounces (148 mL) of wine, or 1.5 ounces (44 mL) of liquor.  . If you are 47-72 years old, ask your health care provider if you should take aspirin to prevent strokes.  . Use sunscreen. Apply sunscreen liberally and repeatedly throughout the day. You should seek shade when your shadow is shorter than you. Protect yourself by wearing long sleeves, pants, a wide-brimmed hat, and sunglasses year round, whenever you are outdoors.  . Once a month, do a whole body skin exam, using a mirror to look at the skin on your back. Tell your health care provider of new moles, moles that have irregular borders, moles that are larger than a pencil eraser, or moles that have changed in shape or color.

## 2018-01-30 NOTE — Progress Notes (Signed)
PCP notes:   Health maintenance:  A1C - completed  Abnormal screenings:   None  Patient concerns:   None  Nurse concerns:  None  Next PCP appt:   02/04/18 @ 1515

## 2018-02-04 ENCOUNTER — Ambulatory Visit (INDEPENDENT_AMBULATORY_CARE_PROVIDER_SITE_OTHER): Payer: PPO | Admitting: Family Medicine

## 2018-02-04 ENCOUNTER — Encounter: Payer: Self-pay | Admitting: Family Medicine

## 2018-02-04 VITALS — BP 126/68 | HR 72 | Temp 97.8°F | Ht 63.0 in | Wt 182.0 lb

## 2018-02-04 DIAGNOSIS — I10 Essential (primary) hypertension: Secondary | ICD-10-CM | POA: Diagnosis not present

## 2018-02-04 DIAGNOSIS — E119 Type 2 diabetes mellitus without complications: Secondary | ICD-10-CM

## 2018-02-04 DIAGNOSIS — E6609 Other obesity due to excess calories: Secondary | ICD-10-CM

## 2018-02-04 DIAGNOSIS — E7800 Pure hypercholesterolemia, unspecified: Secondary | ICD-10-CM

## 2018-02-04 DIAGNOSIS — Z0000 Encounter for general adult medical examination without abnormal findings: Secondary | ICD-10-CM | POA: Diagnosis not present

## 2018-02-04 DIAGNOSIS — E559 Vitamin D deficiency, unspecified: Secondary | ICD-10-CM | POA: Diagnosis not present

## 2018-02-04 DIAGNOSIS — M8589 Other specified disorders of bone density and structure, multiple sites: Secondary | ICD-10-CM

## 2018-02-04 DIAGNOSIS — Z6832 Body mass index (BMI) 32.0-32.9, adult: Secondary | ICD-10-CM

## 2018-02-04 DIAGNOSIS — Z23 Encounter for immunization: Secondary | ICD-10-CM

## 2018-02-04 NOTE — Progress Notes (Signed)
Subjective:    Patient ID: Amy Fry, female    DOB: April 17, 1945, 73 y.o.   MRN: [id number]  HPI Here for health maintenance exam and to review chronic medical problems    Feeling good  Nothing new going on   Wt Readings from Last 3 Encounters:  02/04/18 182 lb (82.6 kg)  01/30/18 179 lb 8 oz (81.4 kg)  07/29/17 187 lb 4 oz (84.9 kg)  taking care of herself  Lost a little wt since July  Exercise -house work (no extra) Diet -pretty good  32.24 kg/m   She still makes bread and giving it away    Had amw on 1/16 No concerns   Declines flu vaccines  Wants the prevnar imm   Mammogram 3/19 ? (she gets at gyn)  Self breast exam -no lumps   Colonoscopy 6/19 with polyps (she wants to change doctors)  Had some difficulty with it  Has a 5 y recall   dexa 2/16 osteopenia , has had one since at gyn but ? When  D level is good at 61 Tried alendronate and did not tolerate it  Now on Boniva -no side effects  H/o past arm fx   Sees gyn annually  Pelvic and breast exam   Zoster status - has not had vaccine Has had shingles vaccine- declines it   bp is stable today  No cp or palpitations or headaches or edema  No side effects to medicines  BP Readings from Last 3 Encounters:  02/04/18 126/68  01/30/18 112/78  11/20/17 131/69     DM2 w/o complications Lab Results  Component Value Date   HGBA1C 6.7 (H) 01/30/2018   This is stable from July  Metformin -no problems  Really trying to watch her diet /staying away from sweets with sugar Eye exam utd  Ace for renal protectoin   Hyperlipidemia Lab Results  Component Value Date   CHOL 147 01/30/2018   CHOL 149 07/25/2017   CHOL 165 01/24/2017   Lab Results  Component Value Date   HDL 54.40 01/30/2018   HDL 52.90 07/25/2017   HDL 54.10 01/24/2017   Lab Results  Component Value Date   LDLCALC 66 01/30/2018   LDLCALC 74 07/25/2017   LDLCALC 79 01/24/2017   Lab Results  Component Value Date   TRIG 132.0  01/30/2018   TRIG 113.0 07/25/2017   TRIG 162.0 (H) 01/24/2017   Lab Results  Component Value Date   CHOLHDL 3 01/30/2018   CHOLHDL 3 07/25/2017   CHOLHDL 3 01/24/2017   No results found for: LDLDIRECT mevacor and diet   Other labs Lab Results  Component Value Date   WBC 4.1 01/30/2018   HGB 14.4 01/30/2018   HCT 41.8 01/30/2018   MCV 92.1 01/30/2018   PLT 322.0 01/30/2018   Lab Results  Component Value Date   CREATININE 0.96 01/30/2018   BUN 17 01/30/2018   NA 139 01/30/2018   K 4.8 01/30/2018   CL 105 01/30/2018   CO2 28 01/30/2018   Lab Results  Component Value Date   ALT 14 01/30/2018   AST 17 01/30/2018   ALKPHOS 55 01/30/2018   BILITOT 0.5 01/30/2018    Lab Results  Component Value Date   TSH 1.12 01/30/2018    Patient Active Problem List   Diagnosis Date Noted  . Need for hepatitis C screening test 12/13/2015  . Obesity 12/11/2014  . Herpes simplex 03/16/2014  . Pedal edema 06/15/2013  .  Constipation 06/15/2013  . History of fracture of arm 06/09/2013  . Encounter for Medicare annual wellness exam 06/17/2012  . Routine general medical examination at a health care facility 02/18/2011  . Vitamin D deficiency 07/30/2008  . DERMATOPHYTOSIS, NAIL 07/26/2006  . Diabetes type 2, controlled ([place]) 07/25/2006  . HYPERCHOLESTEROLEMIA 07/25/2006  . Essential hypertension 07/25/2006  . Osteopenia 07/25/2006   Past Medical History:  Diagnosis Date  . Arthritis   . Diabetes mellitus    type II  . Early cataracts, bilateral    no sx yet   . Hyperlipidemia   . Hypertension   . Osteopenia    Past Surgical History:  Procedure Laterality Date  . CYSTOSCOPY  10-1999  . FRACTURE SURGERY  2010   rib fx- from fall   . LAPAROSCOPIC CHOLECYSTECTOMY    . OOPHORECTOMY    . ORIF HUMERUS FRACTURE Left 06/09/2013   Procedure: OPEN REDUCTION INTERNAL FIXATION (ORIF) PROXIMAL HUMERUS FRACTURE;  Surgeon: Rozanna Box, MD;  Location: [facility];  Service: Orthopedics;   Laterality: Left;   Social History   Tobacco Use  . Smoking status: Former Research scientist (life sciences)  . Smokeless tobacco: Never Used  . Tobacco comment: a little as a teenager  Substance Use Topics  . Alcohol use: No    Alcohol/week: 0.0 standard drinks  . Drug use: No   Family History  Problem Relation Age of Onset  . Heart attack Mother   . Heart attack Brother   . Stroke Brother   . Depression Sister   . Colon cancer Neg Hx   . Stomach cancer Neg Hx   . Esophageal cancer Neg Hx   . Rectal cancer Neg Hx    Allergies  Allergen Reactions  . Alendronate Sodium     REACTION: reaction not known  . Atorvastatin     REACTION: headaches  . Pravastatin Sodium     REACTION: headaches and arm pain   Current Outpatient Medications on File Prior to Visit  Medication Sig Dispense Refill  . Ascorbic Acid (VITAMIN C) 1000 MG tablet Take 500 mg by mouth daily.     Marland Kitchen aspirin 81 MG EC tablet Take 81 mg by mouth daily.      . Calcium Carbonate-Vit D-Min (CALCIUM 1200 PO) Take 1 tablet by mouth 2 (two) times daily.    . Cholecalciferol (VITAMIN D) 2000 UNITS CAPS Take 2,000 Units by mouth daily.     Marland Kitchen CRANBERRY PO Take 4,200 Units by mouth daily.    Marland Kitchen gemfibrozil (LOPID) 600 MG tablet Take 1 tablet (600 mg total) by mouth 2 (two) times daily. 180 tablet 3  . ibandronate (BONIVA) 150 MG tablet     . lisinopril (PRINIVIL,ZESTRIL) 5 MG tablet Take 1 tablet (5 mg total) by mouth daily. 90 tablet 3  . lovastatin (MEVACOR) 20 MG tablet Take 1 tablet (20 mg total) by mouth daily. 90 tablet 3  . metFORMIN (GLUCOPHAGE) 500 MG tablet TAKE 1 TABLET BY MOUTH TWICE A DAY WITH A MEAL 180 tablet 3  . Omega-3 300 MG CAPS Take 300 mg by mouth 2 (two) times daily.     Marland Kitchen spironolactone (ALDACTONE) 25 MG tablet TAKE 1/2 TABLET BY MOUTH ONCE A DAY 45 tablet 3  . vitamin B-12 (CYANOCOBALAMIN) 1000 MCG tablet Take 1,000 mcg by mouth daily.     No current facility-administered medications on file prior to visit.      Review  of Systems  Constitutional: Negative for activity change, appetite change,  fatigue, fever and unexpected weight change.  HENT: Negative for congestion, ear pain, rhinorrhea, sinus pressure and sore throat.   Eyes: Negative for pain, redness and visual disturbance.  Respiratory: Negative for cough, shortness of breath and wheezing.   Cardiovascular: Negative for chest pain and palpitations.  Gastrointestinal: Negative for abdominal pain, blood in stool, constipation and diarrhea.  Endocrine: Negative for polydipsia and polyuria.  Genitourinary: Negative for dysuria, frequency and urgency.  Musculoskeletal: Negative for arthralgias, back pain and myalgias.  Skin: Negative for pallor and rash.  Allergic/Immunologic: Negative for environmental allergies.  Neurological: Negative for dizziness, syncope and headaches.  Hematological: Negative for adenopathy. Does not bruise/bleed easily.  Psychiatric/Behavioral: Negative for decreased concentration and dysphoric mood. The patient is not nervous/anxious.        Objective:   Physical Exam Constitutional:      General: She is not in acute distress.    Appearance: Normal appearance. She is well-developed. She is obese. She is not ill-appearing.  HENT:     Head: Normocephalic and atraumatic.     Right Ear: Tympanic membrane, ear canal and external ear normal.     Left Ear: Tympanic membrane, ear canal and external ear normal.     Nose: Nose normal.     Mouth/Throat:     Mouth: Mucous membranes are moist.     Pharynx: Oropharynx is clear.  Eyes:     General: No scleral icterus.    Conjunctiva/sclera: Conjunctivae normal.     Pupils: Pupils are equal, round, and reactive to light.  Neck:     Musculoskeletal: Normal range of motion and neck supple.     Thyroid: No thyromegaly.     Vascular: No carotid bruit or JVD.  Cardiovascular:     Rate and Rhythm: Normal rate and regular rhythm.     Heart sounds: Normal heart sounds. No gallop.     Pulmonary:     Effort: Pulmonary effort is normal. No respiratory distress.     Breath sounds: Normal breath sounds. No wheezing or rales.  Chest:     Chest wall: No tenderness.  Abdominal:     General: Bowel sounds are normal. There is no distension or abdominal bruit.     Palpations: Abdomen is soft. There is no mass.     Tenderness: There is no abdominal tenderness.  Genitourinary:    Comments: Gyn does her breast and pelvic exams  Musculoskeletal: Normal range of motion.        General: No tenderness or deformity.     Right lower leg: No edema.     Left lower leg: No edema.     Comments: No kyphosis   Lymphadenopathy:     Cervical: No cervical adenopathy.  Skin:    General: Skin is warm and dry.     Coloration: Skin is not pale.     Findings: No erythema, lesion or rash.     Comments: Solar lentigines diffusely   Neurological:     General: No focal deficit present.     Mental Status: She is alert.     Cranial Nerves: No cranial nerve deficit.     Motor: No abnormal muscle tone.     Coordination: Coordination normal.     Deep Tendon Reflexes: Reflexes are normal and symmetric. Reflexes normal.  Psychiatric:        Mood and Affect: Mood normal.           Assessment & Plan:   Problem List Items Addressed  This Visit      Cardiovascular and Mediastinum   Essential hypertension    bp in fair control at this time  BP Readings from Last 1 Encounters:  02/04/18 126/68   No changes needed Most recent labs reviewed  Disc lifstyle change with low sodium diet and exercise          Endocrine   Diabetes type 2, controlled ([place])    Lab Results  Component Value Date   HGBA1C 6.7 (H) 01/30/2018   Well controlled  Ace for renal protection  utd eye exam Good foot care         Musculoskeletal and Integument   Osteopenia    Sent to gyn office for last dexa report  Now on Boniva monthly for OP  No new falls or fx Good vit D level  Gyn will continue to  follow        Other   Vitamin D deficiency    Vitamin D level is therapeutic with current supplementation Disc importance of this to bone and overall health       HYPERCHOLESTEROLEMIA    Disc goals for lipids and reasons to control them Rev last labs with pt Rev low sat fat diet in detail Well controlled with mevacor and diet       Routine general medical examination at a health care facility - Primary    Reviewed health habits including diet and exercise and skin cancer prevention Reviewed appropriate screening tests for age  Also reviewed health mt list, fam hx and immunization status , as well as social and family history   See HPi amw rev Labs rev  Doing well  Enc wt loss and exercise  prevnar vaccine today        Obesity    Discussed how this problem influences overall health and the risks it imposes  Reviewed plan for weight loss with lower calorie diet (via better food choices and also portion control or program like weight watchers) and exercise building up to or more than 30 minutes 5 days per week including some aerobic activity

## 2018-02-04 NOTE — Assessment & Plan Note (Signed)
Discussed how this problem influences overall health and the risks it imposes  Reviewed plan for weight loss with lower calorie diet (via better food choices and also portion control or program like weight watchers) and exercise building up to or more than 30 minutes 5 days per week including some aerobic activity    

## 2018-02-04 NOTE — Assessment & Plan Note (Signed)
Reviewed health habits including diet and exercise and skin cancer prevention Reviewed appropriate screening tests for age  Also reviewed health mt list, fam hx and immunization status , as well as social and family history   See HPi amw rev Labs rev  Doing well  Enc wt loss and exercise  prevnar vaccine today

## 2018-02-04 NOTE — Assessment & Plan Note (Signed)
Vitamin D level is therapeutic with current supplementation Disc importance of this to bone and overall health  

## 2018-02-04 NOTE — Patient Instructions (Addendum)
Regular exercise - an extra 30 minutes per day is very important   If you are interested in the new shingles vaccine (Shingrix) - call your local pharmacy to check on coverage and availability  If affordable, get on a wait list at your pharmacy to get the vaccine.    take care of yourself   prevnar vaccine today

## 2018-02-04 NOTE — Assessment & Plan Note (Signed)
Lab Results  Component Value Date   HGBA1C 6.7 (H) 01/30/2018   Well controlled  Ace for renal protection  utd eye exam Good foot care

## 2018-02-04 NOTE — Assessment & Plan Note (Signed)
Disc goals for lipids and reasons to control them Rev last labs with pt Rev low sat fat diet in detail Well controlled with mevacor and diet

## 2018-02-04 NOTE — Assessment & Plan Note (Signed)
bp in fair control at this time  BP Readings from Last 1 Encounters:  02/04/18 126/68   No changes needed Most recent labs reviewed  Disc lifstyle change with low sodium diet and exercise

## 2018-02-04 NOTE — Assessment & Plan Note (Signed)
Sent to gyn office for last dexa report  Now on Boniva monthly for OP  No new falls or fx Good vit D level  Gyn will continue to follow

## 2018-02-05 NOTE — Progress Notes (Signed)
I reviewed health advisor's note, was available for consultation, and agree with documentation and plan.  

## 2018-07-30 ENCOUNTER — Telehealth: Payer: Self-pay

## 2018-07-30 NOTE — Telephone Encounter (Signed)
Left detailed VM w COVID screen and back door lab info   

## 2018-07-31 ENCOUNTER — Telehealth: Payer: Self-pay | Admitting: Family Medicine

## 2018-07-31 DIAGNOSIS — M810 Age-related osteoporosis without current pathological fracture: Secondary | ICD-10-CM | POA: Diagnosis not present

## 2018-07-31 DIAGNOSIS — Z01419 Encounter for gynecological examination (general) (routine) without abnormal findings: Secondary | ICD-10-CM | POA: Diagnosis not present

## 2018-07-31 DIAGNOSIS — E119 Type 2 diabetes mellitus without complications: Secondary | ICD-10-CM

## 2018-07-31 DIAGNOSIS — I10 Essential (primary) hypertension: Secondary | ICD-10-CM

## 2018-07-31 DIAGNOSIS — Z6832 Body mass index (BMI) 32.0-32.9, adult: Secondary | ICD-10-CM | POA: Diagnosis not present

## 2018-07-31 DIAGNOSIS — E559 Vitamin D deficiency, unspecified: Secondary | ICD-10-CM

## 2018-07-31 DIAGNOSIS — E7800 Pure hypercholesterolemia, unspecified: Secondary | ICD-10-CM

## 2018-07-31 DIAGNOSIS — Z1231 Encounter for screening mammogram for malignant neoplasm of breast: Secondary | ICD-10-CM | POA: Diagnosis not present

## 2018-07-31 NOTE — Telephone Encounter (Signed)
-----   Message from Cloyd Stagers, RT sent at 07/23/2018  3:27 PM EDT ----- Regarding: Lab Orders for Friday 7.17.2020 Please place lab orders for Friday 7.17.2020, office visit for physical on Tuesday 7.21.2020 Thank you, Dyke Maes RT(R)

## 2018-08-01 ENCOUNTER — Other Ambulatory Visit: Payer: Self-pay

## 2018-08-01 ENCOUNTER — Other Ambulatory Visit (INDEPENDENT_AMBULATORY_CARE_PROVIDER_SITE_OTHER): Payer: PPO

## 2018-08-01 DIAGNOSIS — E559 Vitamin D deficiency, unspecified: Secondary | ICD-10-CM | POA: Diagnosis not present

## 2018-08-01 DIAGNOSIS — E119 Type 2 diabetes mellitus without complications: Secondary | ICD-10-CM

## 2018-08-01 DIAGNOSIS — I10 Essential (primary) hypertension: Secondary | ICD-10-CM

## 2018-08-01 DIAGNOSIS — E7800 Pure hypercholesterolemia, unspecified: Secondary | ICD-10-CM

## 2018-08-01 LAB — COMPREHENSIVE METABOLIC PANEL
ALT: 15 U/L (ref 0–35)
AST: 15 U/L (ref 0–37)
Albumin: 4.5 g/dL (ref 3.5–5.2)
Alkaline Phosphatase: 52 U/L (ref 39–117)
BUN: 23 mg/dL (ref 6–23)
CO2: 29 mEq/L (ref 19–32)
Calcium: 10 mg/dL (ref 8.4–10.5)
Chloride: 104 mEq/L (ref 96–112)
Creatinine, Ser: 0.96 mg/dL (ref 0.40–1.20)
GFR: 57.16 mL/min — ABNORMAL LOW (ref >60.00)
Glucose, Bld: 127 mg/dL — ABNORMAL HIGH (ref 70–99)
Potassium: 4.7 mEq/L (ref 3.5–5.1)
Sodium: 139 mEq/L (ref 135–145)
Total Bilirubin: 0.5 mg/dL (ref 0.2–1.2)
Total Protein: 6.6 g/dL (ref 6.0–8.3)

## 2018-08-01 LAB — LIPID PANEL
Cholesterol: 158 mg/dL (ref 0–200)
HDL: 50.4 mg/dL (ref >39.00)
LDL Cholesterol: 81 mg/dL (ref 0–99)
NonHDL: 107.34
Total CHOL/HDL Ratio: 3
Triglycerides: 133 mg/dL (ref 0.0–149.0)
VLDL: 26.6 mg/dL (ref 0.0–40.0)

## 2018-08-01 LAB — CBC WITH DIFFERENTIAL/PLATELET
Basophils Absolute: 0 10*3/uL (ref 0.0–0.1)
Basophils Relative: 1.1 % (ref 0.0–3.0)
Eosinophils Absolute: 0.1 10*3/uL (ref 0.0–0.7)
Eosinophils Relative: 3.3 % (ref 0.0–5.0)
HCT: 39.8 % (ref 36.0–46.0)
Hemoglobin: 13.5 g/dL (ref 12.0–15.0)
Lymphocytes Relative: 44 % (ref 12.0–46.0)
Lymphs Abs: 1.9 10*3/uL (ref 0.7–4.0)
MCHC: 33.9 g/dL (ref 30.0–36.0)
MCV: 94.4 fl (ref 78.0–100.0)
Monocytes Absolute: 0.4 10*3/uL (ref 0.1–1.0)
Monocytes Relative: 9.7 % (ref 3.0–12.0)
Neutro Abs: 1.8 10*3/uL (ref 1.4–7.7)
Neutrophils Relative %: 41.9 % — ABNORMAL LOW (ref 43.0–77.0)
Platelets: 265 10*3/uL (ref 150.0–400.0)
RBC: 4.22 Mil/uL (ref 3.87–5.11)
RDW: 13.5 % (ref 11.5–15.5)
WBC: 4.3 10*3/uL (ref 4.0–10.5)

## 2018-08-01 LAB — TSH: TSH: 1.22 u[IU]/mL (ref 0.35–4.50)

## 2018-08-01 LAB — VITAMIN D 25 HYDROXY (VIT D DEFICIENCY, FRACTURES): VITD: 48.75 ng/mL (ref 30.00–100.00)

## 2018-08-01 LAB — HEMOGLOBIN A1C: Hgb A1c MFr Bld: 6.8 % — ABNORMAL HIGH (ref 4.6–6.5)

## 2018-08-04 ENCOUNTER — Other Ambulatory Visit: Payer: Self-pay | Admitting: Obstetrics and Gynecology

## 2018-08-04 DIAGNOSIS — R928 Other abnormal and inconclusive findings on diagnostic imaging of breast: Secondary | ICD-10-CM

## 2018-08-05 ENCOUNTER — Other Ambulatory Visit: Payer: Self-pay

## 2018-08-05 ENCOUNTER — Encounter: Payer: Self-pay | Admitting: Family Medicine

## 2018-08-05 ENCOUNTER — Ambulatory Visit (INDEPENDENT_AMBULATORY_CARE_PROVIDER_SITE_OTHER): Payer: PPO | Admitting: Family Medicine

## 2018-08-05 VITALS — BP 138/78 | HR 71 | Temp 97.9°F | Ht 63.0 in | Wt 187.4 lb

## 2018-08-05 DIAGNOSIS — I10 Essential (primary) hypertension: Secondary | ICD-10-CM

## 2018-08-05 DIAGNOSIS — E7800 Pure hypercholesterolemia, unspecified: Secondary | ICD-10-CM

## 2018-08-05 DIAGNOSIS — Z6832 Body mass index (BMI) 32.0-32.9, adult: Secondary | ICD-10-CM

## 2018-08-05 DIAGNOSIS — E6609 Other obesity due to excess calories: Secondary | ICD-10-CM | POA: Diagnosis not present

## 2018-08-05 DIAGNOSIS — E66811 Obesity, class 1: Secondary | ICD-10-CM

## 2018-08-05 DIAGNOSIS — E119 Type 2 diabetes mellitus without complications: Secondary | ICD-10-CM

## 2018-08-05 DIAGNOSIS — E559 Vitamin D deficiency, unspecified: Secondary | ICD-10-CM

## 2018-08-05 MED ORDER — LISINOPRIL 5 MG PO TABS: 5.0000 mg | ORAL_TABLET | Freq: Every day | ORAL | 3 refills | Status: DC

## 2018-08-05 MED ORDER — LOVASTATIN 20 MG PO TABS: 20.0000 mg | ORAL_TABLET | Freq: Every day | ORAL | 3 refills | Status: DC

## 2018-08-05 MED ORDER — GEMFIBROZIL 600 MG PO TABS: 600.0000 mg | ORAL_TABLET | Freq: Two times a day (BID) | ORAL | 3 refills | Status: DC

## 2018-08-05 MED ORDER — SPIRONOLACTONE 25 MG PO TABS: ORAL_TABLET | ORAL | 3 refills | Status: DC

## 2018-08-05 MED ORDER — METFORMIN HCL 500 MG PO TABS: ORAL_TABLET | ORAL | 3 refills | Status: DC

## 2018-08-05 NOTE — Assessment & Plan Note (Signed)
Disc goals for lipids and reasons to control them Rev last labs with pt Rev low sat fat diet in detail Taking gemfibrozil and lovastatin  LDL up slightly to 81 -goal under 70 Will work on diet F/u 6 mo

## 2018-08-05 NOTE — Patient Instructions (Addendum)
For cholesterol  Avoid red meat/ fried foods/ egg yolks/ fatty breakfast meats/ butter, cheese and high fat dairy/ and shellfish    Get your eye exam as planned Keep taking good care of your feet   For diabetes :  Try to get most of your carbohydrates from produce (with the exception of white potatoes)  Eat less bread/pasta/rice/snack foods/cereals/sweets and other items from the middle of the grocery store (processed carbs)   Stay active  Aim for at least 30 minutes of exercise daily

## 2018-08-05 NOTE — Assessment & Plan Note (Signed)
Lab Results  Component Value Date   HGBA1C 6.8 (H) 08/01/2018   This is fairly stable with metformin  Discussed diet  Eye exam scheduled  Good foot care On ace and statin  Enc wt loss and exercise  F/u 6 mo

## 2018-08-05 NOTE — Assessment & Plan Note (Signed)
Discussed how this problem influences overall health and the risks it imposes  Reviewed plan for weight loss with lower calorie diet (via better food choices and also portion control or program like weight watchers) and exercise building up to or more than 30 minutes 5 days per week including some aerobic activity    

## 2018-08-05 NOTE — Assessment & Plan Note (Signed)
bp in fair control at this time  BP Readings from Last 1 Encounters:  08/05/18 138/78   No changes needed Most recent labs reviewed  Disc lifstyle change with low sodium diet and exercise

## 2018-08-05 NOTE — Progress Notes (Signed)
Subjective:    Patient ID: Amy Fry, female    DOB: 22-Dec-1945, 73 y.o.   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   Saw gyn last week  Mammogram - has to have another view -tomorrow  She is worried about it    Wt Readings from Last 3 Encounters:  08/05/18 187 lb 7 oz (85 kg)  02/04/18 182 lb (82.6 kg)  01/30/18 179 lb 8 oz (81.4 kg)  wt is up  Eating more stuck at home  Exercise - lots of yard work -at least 30 minutes a day  33.20 kg/m   bp is stable today  No cp or palpitations or headaches or edema  No side effects to medicines  BP Readings from Last 3 Encounters:  08/05/18 138/78  02/04/18 126/68  01/30/18 112/78     Diabetes 2 Home sugar results  DM diet - so /so  -not optimal Avoids bread and pasta  She eats sugar free sweets for the most part Exercise -yard work every single day Symptoms-none  A1C last  Lab Results  Component Value Date   HGBA1C 6.8 (H) 08/01/2018  this is fairly stable from 6.7 last time   No problems with medications -low dose metformin  Renal protection ace On statin   Last eye exam  8/19-has it scheduled 08/28/18  Foot care - no problems   Hyperlipidemia Lab Results  Component Value Date   CHOL 158 08/01/2018   CHOL 147 01/30/2018   CHOL 149 07/25/2017   Lab Results  Component Value Date   HDL 50.40 08/01/2018   HDL 54.40 01/30/2018   HDL 52.90 07/25/2017   Lab Results  Component Value Date   LDLCALC 81 08/01/2018   LDLCALC 66 01/30/2018   LDLCALC 74 07/25/2017   Lab Results  Component Value Date   TRIG 133.0 08/01/2018   TRIG 132.0 01/30/2018   TRIG 113.0 07/25/2017   Lab Results  Component Value Date   CHOLHDL 3 08/01/2018   CHOLHDL 3 01/30/2018   CHOLHDL 3 07/25/2017   No results found for: LDLDIRECT Lopid and lovastatin -no missed doses  Grills most of the time  Not a lot of red meat  No  shellfish  Some fish /occ  occ french fries   Vit D def Level 48.7  Lab Results  Component Value Date   TSH 1.22 08/01/2018    Lab Results  Component Value Date   CREATININE 0.96 08/01/2018   BUN 23 08/01/2018   NA 139 08/01/2018   K 4.7 08/01/2018   CL 104 08/01/2018   CO2 29 08/01/2018   Lab Results  Component Value Date   ALT 15 08/01/2018   AST 15 08/01/2018   ALKPHOS 52 08/01/2018   BILITOT 0.5 08/01/2018    Patient Active Problem List   Diagnosis Date Noted  . Need for hepatitis C screening test 12/13/2015  . Obesity 12/11/2014  . Herpes simplex 03/16/2014  . Pedal edema 06/15/2013  . Constipation 06/15/2013  . History of fracture of arm 06/09/2013  . Encounter for Medicare annual wellness exam 06/17/2012  . Routine general medical examination at a health care facility 02/18/2011  . Vitamin D deficiency 07/30/2008  . DERMATOPHYTOSIS, NAIL 07/26/2006  . Diabetes type 2, controlled ([place]) 07/25/2006  . HYPERCHOLESTEROLEMIA 07/25/2006  . Essential hypertension 07/25/2006  . Osteopenia 07/25/2006   Past  Medical History:  Diagnosis Date  . Arthritis   . Diabetes mellitus    type II  . Early cataracts, bilateral    no sx yet   . Hyperlipidemia   . Hypertension   . Osteopenia    Past Surgical History:  Procedure Laterality Date  . CYSTOSCOPY  10-1999  . FRACTURE SURGERY  2010   rib fx- from fall   . LAPAROSCOPIC CHOLECYSTECTOMY    . OOPHORECTOMY    . ORIF HUMERUS FRACTURE Left 06/09/2013   Procedure: OPEN REDUCTION INTERNAL FIXATION (ORIF) PROXIMAL HUMERUS FRACTURE;  Surgeon: Rozanna Box, MD;  Location: [facility] [facility];  Service: Orthopedics;  Laterality: Left;   Social History   Tobacco Use  . Smoking status: Former Research scientist (life sciences)  . Smokeless tobacco: Never Used  . Tobacco comment: a little as a teenager  Substance Use Topics  . Alcohol use: No    Alcohol/week: 0.0 standard drinks  . Drug use: No   Family History  Problem Relation Age of Onset  . Heart  attack Mother   . Heart attack Brother   . Stroke Brother   . Depression Sister   . Colon cancer Neg Hx   . Stomach cancer Neg Hx   . Esophageal cancer Neg Hx   . Rectal cancer Neg Hx    Allergies  Allergen Reactions  . Alendronate Sodium     REACTION: reaction not known  . Atorvastatin     REACTION: headaches  . Pravastatin Sodium     REACTION: headaches and arm pain   Current Outpatient Medications on File Prior to Visit  Medication Sig Dispense Refill  . Ascorbic Acid (VITAMIN C) 1000 MG tablet Take 500 mg by mouth daily.     Marland Kitchen aspirin 81 MG EC tablet Take 81 mg by mouth daily.      . Calcium Carbonate-Vit D-Min (CALCIUM 1200 PO) Take 1 tablet by mouth 2 (two) times daily.    . Cholecalciferol (VITAMIN D) 2000 UNITS CAPS Take 2,000 Units by mouth daily.     Marland Kitchen CRANBERRY PO Take 4,200 Units by mouth daily.    Marland Kitchen ibandronate (BONIVA) 150 MG tablet     . Omega-3 300 MG CAPS Take 300 mg by mouth 2 (two) times daily.     . vitamin B-12 (CYANOCOBALAMIN) 1000 MCG tablet Take 1,000 mcg by mouth daily.     No current facility-administered medications on file prior to visit.     Review of Systems  Constitutional: Negative for activity change, appetite change, fatigue, fever and unexpected weight change.  HENT: Negative for congestion, ear pain, rhinorrhea, sinus pressure and sore throat.   Eyes: Negative for pain, redness and visual disturbance.  Respiratory: Negative for cough, shortness of breath and wheezing.   Cardiovascular: Negative for chest pain and palpitations.  Gastrointestinal: Negative for abdominal pain, blood in stool, constipation and diarrhea.  Endocrine: Negative for polydipsia and polyuria.  Genitourinary: Negative for dysuria, frequency and urgency.  Musculoskeletal: Positive for back pain. Negative for arthralgias and myalgias.  Skin: Negative for pallor and rash.  Allergic/Immunologic: Negative for environmental allergies.  Neurological: Negative for dizziness,  syncope and headaches.  Hematological: Negative for adenopathy. Does not bruise/bleed easily.  Psychiatric/Behavioral: Negative for decreased concentration and dysphoric mood. The patient is not nervous/anxious.        Objective:   Physical Exam Constitutional:      General: She is not in acute distress.    Appearance: Normal appearance. She is well-developed. She is  obese. She is not ill-appearing or diaphoretic.  HENT:     Head: Normocephalic and atraumatic.     Mouth/Throat:     Mouth: Mucous membranes are moist.     Pharynx: Oropharynx is clear.  Eyes:     Conjunctiva/sclera: Conjunctivae normal.     Pupils: Pupils are equal, round, and reactive to light.  Neck:     Musculoskeletal: Normal range of motion and neck supple.     Thyroid: No thyromegaly.     Vascular: No carotid bruit or JVD.  Cardiovascular:     Rate and Rhythm: Normal rate and regular rhythm.     Pulses: Normal pulses.     Heart sounds: Normal heart sounds. No gallop.   Pulmonary:     Effort: Pulmonary effort is normal. No respiratory distress.     Breath sounds: Normal breath sounds. No wheezing or rales.  Abdominal:     General: Bowel sounds are normal. There is no distension or abdominal bruit.     Palpations: Abdomen is soft. There is no mass.     Tenderness: There is no abdominal tenderness.  Musculoskeletal:     Right lower leg: No edema.     Left lower leg: No edema.  Lymphadenopathy:     Cervical: No cervical adenopathy.  Skin:    General: Skin is warm and dry.     Coloration: Skin is not pale.     Findings: No rash.  Neurological:     Mental Status: She is alert. Mental status is at baseline.     Sensory: No sensory deficit.     Coordination: Coordination normal.     Deep Tendon Reflexes: Reflexes are normal and symmetric. Reflexes normal.  Psychiatric:        Mood and Affect: Mood normal.           Assessment & Plan:   Problem List Items Addressed This Visit      Cardiovascular  and Mediastinum   Essential hypertension    bp in fair control at this time  BP Readings from Last 1 Encounters:  08/05/18 138/78   No changes needed Most recent labs reviewed  Disc lifstyle change with low sodium diet and exercise        Relevant Medications   gemfibrozil (LOPID) 600 MG tablet   lisinopril (ZESTRIL) 5 MG tablet   lovastatin (MEVACOR) 20 MG tablet   spironolactone (ALDACTONE) 25 MG tablet     Endocrine   Diabetes type 2, controlled ([place]) - Primary    Lab Results  Component Value Date   HGBA1C 6.8 (H) 08/01/2018   This is fairly stable with metformin  Discussed diet  Eye exam scheduled  Good foot care On ace and statin  Enc wt loss and exercise  F/u 6 mo       Relevant Medications   lisinopril (ZESTRIL) 5 MG tablet   lovastatin (MEVACOR) 20 MG tablet   metFORMIN (GLUCOPHAGE) 500 MG tablet     Other   Vitamin D deficiency    Vitamin D level is therapeutic with current supplementation Disc importance of this to bone and overall health In 40s      HYPERCHOLESTEROLEMIA    Disc goals for lipids and reasons to control them Rev last labs with pt Rev low sat fat diet in detail Taking gemfibrozil and lovastatin  LDL up slightly to 81 -goal under 70 Will work on diet F/u 6 mo      Relevant Medications  gemfibrozil (LOPID) 600 MG tablet   lisinopril (ZESTRIL) 5 MG tablet   lovastatin (MEVACOR) 20 MG tablet   spironolactone (ALDACTONE) 25 MG tablet   Obesity    Discussed how this problem influences overall health and the risks it imposes  Reviewed plan for weight loss with lower calorie diet (via better food choices and also portion control or program like weight watchers) and exercise building up to or more than 30 minutes 5 days per week including some aerobic activity         Relevant Medications   metFORMIN (GLUCOPHAGE) 500 MG tablet

## 2018-08-05 NOTE — Assessment & Plan Note (Signed)
Vitamin D level is therapeutic with current supplementation Disc importance of this to bone and overall health In 40s

## 2018-08-06 ENCOUNTER — Ambulatory Visit
Admission: RE | Admit: 2018-08-06 | Discharge: 2018-08-06 | Disposition: A | Discharge status: Home / Self Care | Payer: PPO | Source: Ambulatory Visit | Attending: Obstetrics and Gynecology | Admitting: Obstetrics and Gynecology

## 2018-08-06 DIAGNOSIS — R928 Other abnormal and inconclusive findings on diagnostic imaging of breast: Secondary | ICD-10-CM

## 2018-08-06 DIAGNOSIS — N6001 Solitary cyst of right breast: Secondary | ICD-10-CM | POA: Diagnosis not present

## 2018-08-28 LAB — HM DIABETES EYE EXAM

## 2018-09-11 ENCOUNTER — Encounter: Payer: Self-pay | Admitting: Family Medicine

## 2018-12-04 ENCOUNTER — Encounter: Payer: Self-pay | Admitting: Family Medicine

## 2018-12-04 ENCOUNTER — Other Ambulatory Visit: Payer: Self-pay

## 2018-12-04 ENCOUNTER — Ambulatory Visit (INDEPENDENT_AMBULATORY_CARE_PROVIDER_SITE_OTHER): Payer: PPO | Admitting: Family Medicine

## 2018-12-04 VITALS — BP 124/82 | HR 70 | Temp 97.1°F | Ht 63.0 in | Wt 193.4 lb

## 2018-12-04 DIAGNOSIS — N3000 Acute cystitis without hematuria: Secondary | ICD-10-CM | POA: Insufficient documentation

## 2018-12-04 DIAGNOSIS — R300 Dysuria: Secondary | ICD-10-CM

## 2018-12-04 LAB — POC URINALSYSI DIPSTICK (AUTOMATED)
Bilirubin, UA: NEGATIVE
Blood, UA: NEGATIVE
Glucose, UA: NEGATIVE
Ketones, UA: NEGATIVE
Nitrite, UA: NEGATIVE
Protein, UA: NEGATIVE
Spec Grav, UA: 1.015 (ref 1.010–1.025)
Urobilinogen, UA: 0.2 E.U./dL
pH, UA: 7 (ref 5.0–8.0)

## 2018-12-04 MED ORDER — SULFAMETHOXAZOLE-TRIMETHOPRIM 800-160 MG PO TABS: 1.0000 | ORAL_TABLET | Freq: Two times a day (BID) | ORAL | 0 refills | Status: DC

## 2018-12-04 NOTE — Progress Notes (Signed)
Subjective:    Patient ID: Amy Fry, female    DOB: 11/16/1945, 73 y.o.   MRN: [id number]  HPI Pt presents with urinary symptoms incl dysuria and frequency  Wt Readings from Last 3 Encounters:  12/04/18 193 lb 7 oz (87.7 kg)  08/05/18 187 lb 7 oz (85 kg)  02/04/18 182 lb (82.6 kg)   34.27 kg/m   Burns to urinate  Going frequently  No urgency  No blood in urine   Some discomfort in her bladder area  A little low back pain  No flank pain   She takes cranberry daily to prevent uti   No nausea No fever   UA: Results for orders placed or performed in visit on 12/04/18  POCT Urinalysis Dipstick (Automated)  Result Value Ref Range   Color, UA Light Yellow    Clarity, UA Clear    Glucose, UA Negative Negative   Bilirubin, UA Negative    Ketones, UA Negative    Spec Grav, UA 1.015 1.010 - 1.025   Blood, UA Negative    pH, UA 7.0 5.0 - 8.0   Protein, UA Negative Negative   Urobilinogen, UA 0.2 0.2 or 1.0 E.U./dL   Nitrite, UA Negative    Leukocytes, UA Moderate (2+) (A) Negative    Patient Active Problem List   Diagnosis Date Noted  . Acute cystitis 12/04/2018  . Need for hepatitis C screening test 12/13/2015  . Obesity 12/11/2014  . Herpes simplex 03/16/2014  . Pedal edema 06/15/2013  . Constipation 06/15/2013  . History of fracture of arm 06/09/2013  . Encounter for Medicare annual wellness exam 06/17/2012  . Routine general medical examination at a health care facility 02/18/2011  . Vitamin D deficiency 07/30/2008  . DERMATOPHYTOSIS, NAIL 07/26/2006  . Diabetes type 2, controlled ([place]) 07/25/2006  . HYPERCHOLESTEROLEMIA 07/25/2006  . Essential hypertension 07/25/2006  . Osteopenia 07/25/2006   Past Medical History:  Diagnosis Date  . Arthritis   . Diabetes mellitus    type II  . Early cataracts, bilateral    no sx yet   . Hyperlipidemia   . Hypertension   . Osteopenia    Past Surgical History:  Procedure Laterality Date  . CYSTOSCOPY   10-1999  . FRACTURE SURGERY  2010   rib fx- from fall   . LAPAROSCOPIC CHOLECYSTECTOMY    . OOPHORECTOMY    . ORIF HUMERUS FRACTURE Left 06/09/2013   Procedure: OPEN REDUCTION INTERNAL FIXATION (ORIF) PROXIMAL HUMERUS FRACTURE;  Surgeon: Rozanna Box, MD;  Location: [facility];  Service: Orthopedics;  Laterality: Left;   Social History   Tobacco Use  . Smoking status: Former Research scientist (life sciences)  . Smokeless tobacco: Never Used  . Tobacco comment: a little as a teenager  Substance Use Topics  . Alcohol use: No    Alcohol/week: 0.0 standard drinks  . Drug use: No   Family History  Problem Relation Age of Onset  . Heart attack Mother   . Heart attack Brother   . Stroke Brother   . Depression Sister   . Colon cancer Neg Hx   . Stomach cancer Neg Hx   . Esophageal cancer Neg Hx   . Rectal cancer Neg Hx    Allergies  Allergen Reactions  . Alendronate Sodium     REACTION: reaction not known  . Atorvastatin     REACTION: headaches  . Pravastatin Sodium     REACTION: headaches and arm pain   Current Outpatient Medications on  File Prior to Visit  Medication Sig Dispense Refill  . Ascorbic Acid (VITAMIN C) 1000 MG tablet Take 500 mg by mouth daily.     Marland Kitchen aspirin 81 MG EC tablet Take 81 mg by mouth daily.      . Calcium Carbonate-Vit D-Min (CALCIUM 1200 PO) Take 1 tablet by mouth 2 (two) times daily.    . Cholecalciferol (VITAMIN D) 2000 UNITS CAPS Take 2,000 Units by mouth daily.     Marland Kitchen CRANBERRY PO Take 4,200 Units by mouth daily.    Marland Kitchen gemfibrozil (LOPID) 600 MG tablet Take 1 tablet (600 mg total) by mouth 2 (two) times daily. 180 tablet 3  . ibandronate (BONIVA) 150 MG tablet     . lisinopril (ZESTRIL) 5 MG tablet Take 1 tablet (5 mg total) by mouth daily. 90 tablet 3  . lovastatin (MEVACOR) 20 MG tablet Take 1 tablet (20 mg total) by mouth daily. 90 tablet 3  . metFORMIN (GLUCOPHAGE) 500 MG tablet TAKE 1 TABLET BY MOUTH TWICE A DAY WITH A MEAL 180 tablet 3  . Omega-3 300 MG CAPS Take 300 mg  by mouth 2 (two) times daily.     Marland Kitchen spironolactone (ALDACTONE) 25 MG tablet TAKE 1/2 TABLET BY MOUTH ONCE A DAY 45 tablet 3  . vitamin B-12 (CYANOCOBALAMIN) 1000 MCG tablet Take 1,000 mcg by mouth daily.     No current facility-administered medications on file prior to visit.      Review of Systems  Constitutional: Positive for fatigue. Negative for activity change, appetite change and fever.  HENT: Negative for congestion and sore throat.   Eyes: Negative for itching and visual disturbance.  Respiratory: Negative for cough and shortness of breath.   Cardiovascular: Negative for leg swelling.  Gastrointestinal: Negative for abdominal distention, abdominal pain, constipation, diarrhea and nausea.  Endocrine: Negative for cold intolerance and polydipsia.  Genitourinary: Positive for dysuria, frequency and urgency. Negative for difficulty urinating, flank pain and hematuria.  Musculoskeletal: Negative for myalgias.  Skin: Negative for rash.  Allergic/Immunologic: Negative for immunocompromised state.  Neurological: Negative for dizziness and weakness.  Hematological: Negative for adenopathy.       Objective:   Physical Exam Constitutional:      General: She is not in acute distress.    Appearance: She is well-developed. She is obese.  HENT:     Head: Normocephalic and atraumatic.  Eyes:     Conjunctiva/sclera: Conjunctivae normal.     Pupils: Pupils are equal, round, and reactive to light.  Neck:     Musculoskeletal: Normal range of motion and neck supple.  Cardiovascular:     Rate and Rhythm: Normal rate and regular rhythm.     Heart sounds: Normal heart sounds.  Pulmonary:     Effort: Pulmonary effort is normal.     Breath sounds: Normal breath sounds.  Abdominal:     General: Bowel sounds are normal. There is no distension.     Palpations: Abdomen is soft.     Tenderness: There is abdominal tenderness. There is no rebound.     Comments: No cva tenderness  Mild  suprapubic tenderness  Lymphadenopathy:     Cervical: No cervical adenopathy.  Skin:    Findings: No rash.  Neurological:     Mental Status: She is alert.  Psychiatric:        Mood and Affect: Mood normal.           Assessment & Plan:   Problem List Items Addressed This  Visit      Genitourinary   Acute cystitis - Primary    With dysuria and frequency Leuk in ua cx pending tx with bactrim ds  Inc fluids  Handout given  inst to call if symptoms worsen  Will alert when cx returns      Relevant Orders   Urine Culture    Other Visit Diagnoses    Dysuria       Relevant Orders   POCT Urinalysis Dipstick (Automated) (Completed)

## 2018-12-04 NOTE — Assessment & Plan Note (Signed)
With dysuria and frequency Leuk in ua cx pending tx with bactrim ds  Inc fluids  Handout given  inst to call if symptoms worsen  Will alert when cx returns

## 2018-12-04 NOTE — Patient Instructions (Addendum)
Drink lots of fluids Take the antibiotic as directed  If symptoms suddenly worsen- please call   We will call you when the culture result returns      Urinary Tract Infection, Adult  A urinary tract infection (UTI) is an infection of any part of the urinary tract. The urinary tract includes the kidneys, ureters, bladder, and urethra. These organs make, store, and get rid of urine in the body. Your health care provider may use other names to describe the infection. An upper UTI affects the ureters and kidneys (pyelonephritis). A lower UTI affects the bladder (cystitis) and urethra (urethritis). What are the causes? Most urinary tract infections are caused by bacteria in your genital area, around the entrance to your urinary tract (urethra). These bacteria grow and cause inflammation of your urinary tract. What increases the risk? You are more likely to develop this condition if:  You have a urinary catheter that stays in place (indwelling).  You are not able to control when you urinate or have a bowel movement (you have incontinence).  You are female and you: ? Use a spermicide or diaphragm for birth control. ? Have low estrogen levels. ? Are pregnant.  You have certain genes that increase your risk (genetics).  You are sexually active.  You take antibiotic medicines.  You have a condition that causes your flow of urine to slow down, such as: ? An enlarged prostate, if you are female. ? Blockage in your urethra (stricture). ? A kidney stone. ? A nerve condition that affects your bladder control (neurogenic bladder). ? Not getting enough to drink, or not urinating often.  You have certain medical conditions, such as: ? Diabetes. ? A weak disease-fighting system (immunesystem). ? Sickle cell disease. ? Gout. ? Spinal cord injury. What are the signs or symptoms? Symptoms of this condition include:  Needing to urinate right away (urgently).  Frequent urination or passing  small amounts of urine frequently.  Pain or burning with urination.  Blood in the urine.  Urine that smells bad or unusual.  Trouble urinating.  Cloudy urine.  Vaginal discharge, if you are female.  Pain in the abdomen or the lower back. You may also have:  Vomiting or a decreased appetite.  Confusion.  Irritability or tiredness.  A fever.  Diarrhea. The first symptom in older adults may be confusion. In some cases, they may not have any symptoms until the infection has worsened. How is this diagnosed? This condition is diagnosed based on your medical history and a physical exam. You may also have other tests, including:  Urine tests.  Blood tests.  Tests for sexually transmitted infections (STIs). If you have had more than one UTI, a cystoscopy or imaging studies may be done to determine the cause of the infections. How is this treated? Treatment for this condition includes:  Antibiotic medicine.  Over-the-counter medicines to treat discomfort.  Drinking enough water to stay hydrated. If you have frequent infections or have other conditions such as a kidney stone, you may need to see a health care provider who specializes in the urinary tract (urologist). In rare cases, urinary tract infections can cause sepsis. Sepsis is a life-threatening condition that occurs when the body responds to an infection. Sepsis is treated in the hospital with IV antibiotics, fluids, and other medicines. Follow these instructions at home:  Medicines  Take over-the-counter and prescription medicines only as told by your health care provider.  If you were prescribed an antibiotic medicine, take it  as told by your health care provider. Do not stop using the antibiotic even if you start to feel better. General instructions  Make sure you: ? Empty your bladder often and completely. Do not hold urine for long periods of time. ? Empty your bladder after sex. ? Wipe from front to back  after a bowel movement if you are female. Use each tissue one time when you wipe.  Drink enough fluid to keep your urine pale yellow.  Keep all follow-up visits as told by your health care provider. This is important. Contact a health care provider if:  Your symptoms do not get better after 1-2 days.  Your symptoms go away and then return. Get help right away if you have:  Severe pain in your back or your lower abdomen.  A fever.  Nausea or vomiting. Summary  A urinary tract infection (UTI) is an infection of any part of the urinary tract, which includes the kidneys, ureters, bladder, and urethra.  Most urinary tract infections are caused by bacteria in your genital area, around the entrance to your urinary tract (urethra).  Treatment for this condition often includes antibiotic medicines.  If you were prescribed an antibiotic medicine, take it as told by your health care provider. Do not stop using the antibiotic even if you start to feel better.  Keep all follow-up visits as told by your health care provider. This is important. This information is not intended to replace advice given to you by your health care provider. Make sure you discuss any questions you have with your health care provider. Document Released: 10/11/2004 Document Revised: 12/19/2017 Document Reviewed: 07/11/2017 Elsevier Patient Education  2020 [employer].

## 2018-12-06 LAB — URINE CULTURE
MICRO NUMBER:: 1119353
SPECIMEN QUALITY:: ADEQUATE

## 2019-02-04 ENCOUNTER — Telehealth: Payer: Self-pay | Admitting: Family Medicine

## 2019-02-04 DIAGNOSIS — E7800 Pure hypercholesterolemia, unspecified: Secondary | ICD-10-CM

## 2019-02-04 DIAGNOSIS — E119 Type 2 diabetes mellitus without complications: Secondary | ICD-10-CM

## 2019-02-04 DIAGNOSIS — E559 Vitamin D deficiency, unspecified: Secondary | ICD-10-CM

## 2019-02-04 DIAGNOSIS — I10 Essential (primary) hypertension: Secondary | ICD-10-CM

## 2019-02-04 NOTE — Telephone Encounter (Signed)
-----   Message from Ellamae Sia sent at 01/28/2019  9:26 AM EST ----- Regarding: lab orders for Thursday, 1.21.21  AWV lab orders, please.

## 2019-02-05 ENCOUNTER — Ambulatory Visit (INDEPENDENT_AMBULATORY_CARE_PROVIDER_SITE_OTHER): Payer: PPO

## 2019-02-05 ENCOUNTER — Ambulatory Visit: Payer: PPO

## 2019-02-05 ENCOUNTER — Other Ambulatory Visit (INDEPENDENT_AMBULATORY_CARE_PROVIDER_SITE_OTHER): Payer: PPO

## 2019-02-05 DIAGNOSIS — E119 Type 2 diabetes mellitus without complications: Secondary | ICD-10-CM

## 2019-02-05 DIAGNOSIS — E559 Vitamin D deficiency, unspecified: Secondary | ICD-10-CM

## 2019-02-05 DIAGNOSIS — I10 Essential (primary) hypertension: Secondary | ICD-10-CM | POA: Diagnosis not present

## 2019-02-05 DIAGNOSIS — Z0000 Encounter for general adult medical examination without abnormal findings: Secondary | ICD-10-CM | POA: Diagnosis not present

## 2019-02-05 DIAGNOSIS — E7800 Pure hypercholesterolemia, unspecified: Secondary | ICD-10-CM

## 2019-02-05 LAB — LIPID PANEL
Cholesterol: 163 mg/dL (ref 0–200)
HDL: 52.6 mg/dL (ref >39.00)
LDL Cholesterol: 85 mg/dL (ref 0–99)
NonHDL: 110.88
Total CHOL/HDL Ratio: 3
Triglycerides: 129 mg/dL (ref 0.0–149.0)
VLDL: 25.8 mg/dL (ref 0.0–40.0)

## 2019-02-05 LAB — CBC WITH DIFFERENTIAL/PLATELET
Basophils Absolute: 0.1 10*3/uL (ref 0.0–0.1)
Basophils Relative: 1.7 % (ref 0.0–3.0)
Eosinophils Absolute: 0.1 10*3/uL (ref 0.0–0.7)
Eosinophils Relative: 2.9 % (ref 0.0–5.0)
HCT: 42.4 % (ref 36.0–46.0)
Hemoglobin: 14.1 g/dL (ref 12.0–15.0)
Lymphocytes Relative: 43.4 % (ref 12.0–46.0)
Lymphs Abs: 2 10*3/uL (ref 0.7–4.0)
MCHC: 33.4 g/dL (ref 30.0–36.0)
MCV: 94.1 fl (ref 78.0–100.0)
Monocytes Absolute: 0.5 10*3/uL (ref 0.1–1.0)
Monocytes Relative: 10 % (ref 3.0–12.0)
Neutro Abs: 1.9 10*3/uL (ref 1.4–7.7)
Neutrophils Relative %: 42 % — ABNORMAL LOW (ref 43.0–77.0)
Platelets: 270 10*3/uL (ref 150.0–400.0)
RBC: 4.5 Mil/uL (ref 3.87–5.11)
RDW: 13.2 % (ref 11.5–15.5)
WBC: 4.5 10*3/uL (ref 4.0–10.5)

## 2019-02-05 LAB — COMPREHENSIVE METABOLIC PANEL
ALT: 17 U/L (ref 0–35)
AST: 18 U/L (ref 0–37)
Albumin: 4.4 g/dL (ref 3.5–5.2)
Alkaline Phosphatase: 62 U/L (ref 39–117)
BUN: 21 mg/dL (ref 6–23)
CO2: 29 mEq/L (ref 19–32)
Calcium: 9.8 mg/dL (ref 8.4–10.5)
Chloride: 103 mEq/L (ref 96–112)
Creatinine, Ser: 0.97 mg/dL (ref 0.40–1.20)
GFR: 56.4 mL/min — ABNORMAL LOW (ref >60.00)
Glucose, Bld: 149 mg/dL — ABNORMAL HIGH (ref 70–99)
Potassium: 4.5 mEq/L (ref 3.5–5.1)
Sodium: 139 mEq/L (ref 135–145)
Total Bilirubin: 0.5 mg/dL (ref 0.2–1.2)
Total Protein: 7 g/dL (ref 6.0–8.3)

## 2019-02-05 LAB — TSH: TSH: 1.28 u[IU]/mL (ref 0.35–4.50)

## 2019-02-05 LAB — VITAMIN D 25 HYDROXY (VIT D DEFICIENCY, FRACTURES): VITD: 55.64 ng/mL (ref 30.00–100.00)

## 2019-02-05 LAB — HEMOGLOBIN A1C: Hgb A1c MFr Bld: 7.1 % — ABNORMAL HIGH (ref 4.6–6.5)

## 2019-02-05 NOTE — Progress Notes (Signed)
PCP notes:  Health Maintenance: Needs pneumovax 23   Abnormal Screenings: none   Patient concerns: none   Nurse concerns: none   Next PCP appt.: 02/11/2019 @ 3:30 pm

## 2019-02-05 NOTE — Patient Instructions (Signed)
Amy Fry , Thank you for taking time to come for your Medicare Wellness Visit. I appreciate your ongoing commitment to your health goals. Please review the following plan we discussed and let me know if I can assist you in the future.   Screening recommendations/referrals: Colonoscopy: Up to date, completed 06/20/2017 Mammogram: Up to date, completed 08/06/2018 Bone Density: Up to date, completed 09/05/2017 Recommended yearly ophthalmology/optometry visit for glaucoma screening and checkup Recommended yearly dental visit for hygiene and checkup  Vaccinations: Influenza vaccine: declined Pneumococcal vaccine:will get at physical Tdap vaccine: Up to date, completed 06/05/2011 Shingles vaccine: declined   Advanced directives: Advance directive discussed with you today. Even though you declined this today please call our office should you change your mind and we can give you the proper paperwork for you to fill out.  Conditions/risks identified: diabetes, hypertension, hyperlipidemia  Next appointment: 02/11/2019 @ 3:30 pm    Preventive Care 65 Years and Older, Female Preventive care refers to lifestyle choices and visits with your health care provider that can promote health and wellness. What does preventive care include?  A yearly physical exam. This is also called an annual well check.  Dental exams once or twice a year.  Routine eye exams. Ask your health care provider how often you should have your eyes checked.  Personal lifestyle choices, including:  Daily care of your teeth and gums.  Regular physical activity.  Eating a healthy diet.  Avoiding tobacco and drug use.  Limiting alcohol use.  Practicing safe sex.  Taking low-dose aspirin every day.  Taking vitamin and mineral supplements as recommended by your health care provider. What happens during an annual well check? The services and screenings done by your health care provider during your annual well check  will depend on your age, overall health, lifestyle risk factors, and family history of disease. Counseling  Your health care provider may ask you questions about your:  Alcohol use.  Tobacco use.  Drug use.  Emotional well-being.  Home and relationship well-being.  Sexual activity.  Eating habits.  History of falls.  Memory and ability to understand (cognition).  Work and work Statistician.  Reproductive health. Screening  You may have the following tests or measurements:  Height, weight, and BMI.  Blood pressure.  Lipid and cholesterol levels. These may be checked every 5 years, or more frequently if you are over 41 years old.  Skin check.  Lung cancer screening. You may have this screening every year starting at age 69 if you have a 30-pack-year history of smoking and currently smoke or have quit within the past 15 years.  Fecal occult blood test (FOBT) of the stool. You may have this test every year starting at age 28.  Flexible sigmoidoscopy or colonoscopy. You may have a sigmoidoscopy every 5 years or a colonoscopy every 10 years starting at age 88.  Hepatitis C blood test.  Hepatitis B blood test.  Sexually transmitted disease (STD) testing.  Diabetes screening. This is done by checking your blood sugar (glucose) after you have not eaten for a while (fasting). You may have this done every 1-3 years.  Bone density scan. This is done to screen for osteoporosis. You may have this done starting at age 70.  Mammogram. This may be done every 1-2 years. Talk to your health care provider about how often you should have regular mammograms. Talk with your health care provider about your test results, treatment options, and if necessary, the need for more  tests. Vaccines  Your health care provider may recommend certain vaccines, such as:  Influenza vaccine. This is recommended every year.  Tetanus, diphtheria, and acellular pertussis (Tdap, Td) vaccine. You may  need a Td booster every 10 years.  Zoster vaccine. You may need this after age 48.  Pneumococcal 13-valent conjugate (PCV13) vaccine. One dose is recommended after age 42.  Pneumococcal polysaccharide (PPSV23) vaccine. One dose is recommended after age 24. Talk to your health care provider about which screenings and vaccines you need and how often you need them. This information is not intended to replace advice given to you by your health care provider. Make sure you discuss any questions you have with your health care provider. Document Released: 01/28/2015 Document Revised: 09/21/2015 Document Reviewed: 11/02/2014 Elsevier Interactive Patient Education  2017 [employer] Prevention in the Home Falls can cause injuries. They can happen to people of all ages. There are many things you can do to make your home safe and to help prevent falls. What can I do on the outside of my home?  Regularly fix the edges of walkways and driveways and fix any cracks.  Remove anything that might make you trip as you walk through a door, such as a raised step or threshold.  Trim any bushes or trees on the path to your home.  Use bright outdoor lighting.  Clear any walking paths of anything that might make someone trip, such as rocks or tools.  Regularly check to see if handrails are loose or broken. Make sure that both sides of any steps have handrails.  Any raised decks and porches should have guardrails on the edges.  Have any leaves, snow, or ice cleared regularly.  Use sand or salt on walking paths during winter.  Clean up any spills in your garage right away. This includes oil or grease spills. What can I do in the bathroom?  Use night lights.  Install grab bars by the toilet and in the tub and shower. Do not use towel bars as grab bars.  Use non-skid mats or decals in the tub or shower.  If you need to sit down in the shower, use a plastic, non-slip stool.  Keep the floor  dry. Clean up any water that spills on the floor as soon as it happens.  Remove soap buildup in the tub or shower regularly.  Attach bath mats securely with double-sided non-slip rug tape.  Do not have throw rugs and other things on the floor that can make you trip. What can I do in the bedroom?  Use night lights.  Make sure that you have a light by your bed that is easy to reach.  Do not use any sheets or blankets that are too big for your bed. They should not hang down onto the floor.  Have a firm chair that has side arms. You can use this for support while you get dressed.  Do not have throw rugs and other things on the floor that can make you trip. What can I do in the kitchen?  Clean up any spills right away.  Avoid walking on wet floors.  Keep items that you use a lot in easy-to-reach places.  If you need to reach something above you, use a strong step stool that has a grab bar.  Keep electrical cords out of the way.  Do not use floor polish or wax that makes floors slippery. If you must use wax, use non-skid floor  wax.  Do not have throw rugs and other things on the floor that can make you trip. What can I do with my stairs?  Do not leave any items on the stairs.  Make sure that there are handrails on both sides of the stairs and use them. Fix handrails that are broken or loose. Make sure that handrails are as long as the stairways.  Check any carpeting to make sure that it is firmly attached to the stairs. Fix any carpet that is loose or worn.  Avoid having throw rugs at the top or bottom of the stairs. If you do have throw rugs, attach them to the floor with carpet tape.  Make sure that you have a light switch at the top of the stairs and the bottom of the stairs. If you do not have them, ask someone to add them for you. What else can I do to help prevent falls?  Wear shoes that:  Do not have high heels.  Have rubber bottoms.  Are comfortable and fit you  well.  Are closed at the toe. Do not wear sandals.  If you use a stepladder:  Make sure that it is fully opened. Do not climb a closed stepladder.  Make sure that both sides of the stepladder are locked into place.  Ask someone to hold it for you, if possible.  Clearly mark and make sure that you can see:  Any grab bars or handrails.  First and last steps.  Where the edge of each step is.  Use tools that help you move around (mobility aids) if they are needed. These include:  Canes.  Walkers.  Scooters.  Crutches.  Turn on the lights when you go into a dark area. Replace any light bulbs as soon as they burn out.  Set up your furniture so you have a clear path. Avoid moving your furniture around.  If any of your floors are uneven, fix them.  If there are any pets around you, be aware of where they are.  Review your medicines with your doctor. Some medicines can make you feel dizzy. This can increase your chance of falling. Ask your doctor what other things that you can do to help prevent falls. This information is not intended to replace advice given to you by your health care provider. Make sure you discuss any questions you have with your health care provider. Document Released: 10/28/2008 Document Revised: 06/09/2015 Document Reviewed: 02/05/2014 Elsevier Interactive Patient Education  2017 [employer].

## 2019-02-05 NOTE — Progress Notes (Signed)
Subjective:   Amy Fry is a 74 y.o. female who presents for Medicare Annual (Subsequent) preventive examination.  Review of Systems: N/A   This visit is being conducted through telemedicine via telephone at the nurse health advisor's home address due to the COVID-19 pandemic. This patient has given me verbal consent via doximity to conduct this visit, patient states they are participating from their home address. Patient and myself are on the telephone call. There is no referral for this visit. Some vital signs may be absent or patient reported.    Patient identification: identified by name, DOB, and current address  Cardiac Risk Factors include: advanced age (>34men, >14 women);diabetes mellitus;hypertension;Other (see comment), Risk factor comments: hypercholesterolemia     Objective:     Vitals: There were no vitals taken for this visit.  There is no height or weight on file to calculate BMI.  Advanced Directives 02/05/2019 01/30/2018 06/20/2017 01/24/2017 12/14/2015 06/09/2013 06/04/2013  Does Patient Have a Medical Advance Directive? No No No No No Patient does not have advance directive Patient does not have advance directive  Would patient like information on creating a medical advance directive? No - Patient declined No - Patient declined - No - Patient declined - - -  Pre-existing out of facility DNR order (yellow form or pink MOST form) - - - - - No -    Tobacco Social History   Tobacco Use  Smoking Status Former Smoker  Smokeless Tobacco Never Used  Tobacco Comment   a little as a teenager     Counseling given: Not Answered Comment: a little as a teenager   Clinical Intake:  Pre-visit preparation completed: Yes  Pain : No/denies pain     Nutritional Risks: None Diabetes: Yes CBG done?: No Did pt. bring in CBG monitor from home?: No  How often do you need to have someone help you when you read instructions, pamphlets, or other written materials from  your doctor or pharmacy?: 1 - Never What is the last grade level you completed in school?: 12th  Interpreter Needed?: No  Information entered by :: CJohnson, LPN  Past Medical History:  Diagnosis Date  . Arthritis   . Diabetes mellitus    type II  . Early cataracts, bilateral    no sx yet   . Hyperlipidemia   . Hypertension   . Osteopenia    Past Surgical History:  Procedure Laterality Date  . CYSTOSCOPY  10-1999  . FRACTURE SURGERY  2010   rib fx- from fall   . LAPAROSCOPIC CHOLECYSTECTOMY    . OOPHORECTOMY    . ORIF HUMERUS FRACTURE Left 06/09/2013   Procedure: OPEN REDUCTION INTERNAL FIXATION (ORIF) PROXIMAL HUMERUS FRACTURE;  Surgeon: Rozanna Box, MD;  Location: [facility];  Service: Orthopedics;  Laterality: Left;   Family History  Problem Relation Age of Onset  . Heart attack Mother   . Heart attack Brother   . Stroke Brother   . Depression Sister   . Colon cancer Neg Hx   . Stomach cancer Neg Hx   . Esophageal cancer Neg Hx   . Rectal cancer Neg Hx    Social History   Socioeconomic History  . Marital status: Married    Spouse name: Not on file  . Number of children: 2  . Years of education: Not on file  . Highest education level: Not on file  Occupational History  . Occupation: Forensic psychologist: [employer]  Tobacco  Use  . Smoking status: Former Smoker  . Smokeless tobacco: Never Used  . Tobacco comment: a little as a teenager  Substance and Sexual Activity  . Alcohol use: No    Alcohol/week: 0.0 standard drinks  . Drug use: No  . Sexual activity: Never  Other Topics Concern  . Not on file  Social History Narrative  . Not on file   Social Determinants of Health   Financial Resource Strain: Low Risk   . Difficulty of Paying Living Expenses: Not hard at all  Food Insecurity: No Food Insecurity  . Worried About Charity fundraiser in the Last Year: Never true  . Ran Out of Food in the Last Year: Never true  Transportation Needs: No  Transportation Needs  . Lack of Transportation (Medical): No  . Lack of Transportation (Non-Medical): No  Physical Activity: Inactive  . Days of Exercise per Week: 0 days  . Minutes of Exercise per Session: 0 min  Stress: No Stress Concern Present  . Feeling of Stress : Not at all  Social Connections:   . Frequency of Communication with Friends and Family: Not on file  . Frequency of Social Gatherings with Friends and Family: Not on file  . Attends Religious Services: Not on file  . Active Member of Clubs or Organizations: Not on file  . Attends Archivist Meetings: Not on file  . Marital Status: Not on file    Outpatient Encounter Medications as of 02/05/2019  Medication Sig  . Ascorbic Acid (VITAMIN C) 1000 MG tablet Take 500 mg by mouth daily.   Marland Kitchen aspirin 81 MG EC tablet Take 81 mg by mouth daily.    . Calcium Carbonate-Vit D-Min (CALCIUM 1200 PO) Take 1 tablet by mouth 2 (two) times daily.  . Cholecalciferol (VITAMIN D) 2000 UNITS CAPS Take 2,000 Units by mouth daily.   Marland Kitchen CRANBERRY PO Take 4,200 Units by mouth daily.  Marland Kitchen gemfibrozil (LOPID) 600 MG tablet Take 1 tablet (600 mg total) by mouth 2 (two) times daily.  Marland Kitchen ibandronate (BONIVA) 150 MG tablet   . lisinopril (ZESTRIL) 5 MG tablet Take 1 tablet (5 mg total) by mouth daily.  Marland Kitchen lovastatin (MEVACOR) 20 MG tablet Take 1 tablet (20 mg total) by mouth daily.  . metFORMIN (GLUCOPHAGE) 500 MG tablet TAKE 1 TABLET BY MOUTH TWICE A DAY WITH A MEAL  . Omega-3 300 MG CAPS Take 300 mg by mouth 2 (two) times daily.   Marland Kitchen spironolactone (ALDACTONE) 25 MG tablet TAKE 1/2 TABLET BY MOUTH ONCE A DAY  . vitamin B-12 (CYANOCOBALAMIN) 1000 MCG tablet Take 1,000 mcg by mouth daily.  Marland Kitchen sulfamethoxazole-trimethoprim (BACTRIM DS) 800-160 MG tablet Take 1 tablet by mouth 2 (two) times daily. With food (Patient not taking: Reported on 02/05/2019)   No facility-administered encounter medications on file as of 02/05/2019.    Activities of Daily  Living In your present state of health, do you have any difficulty performing the following activities: 02/05/2019  Hearing? N  Vision? N  Difficulty concentrating or making decisions? N  Walking or climbing stairs? N  Dressing or bathing? N  Doing errands, shopping? N  Preparing Food and eating ? N  Using the Toilet? N  In the past six months, have you accidently leaked urine? N  Do you have problems with loss of bowel control? N  Managing your Medications? N  Managing your Finances? N  Housekeeping or managing your Housekeeping? N  Some recent data might  be hidden    Patient Care Team: Tower, Wynelle Fanny, MD as PCP - General Jola Schmidt, MD as Consulting Physician (Ophthalmology) Ralene Bathe, MD as Consulting Physician (Dermatology) Arvella Nigh, MD as Consulting Physician (Obstetrics and Gynecology)    Assessment:   This is a routine wellness examination for Varvara.  Exercise Activities and Dietary recommendations Current Exercise Habits: The patient does not participate in regular exercise at present, Exercise limited by: None identified  Goals    . Patient Stated     Starting 01/30/2018, I will continue to take medications as prescribed.     . Patient Stated     02/05/2019, I will maintain and continue medications as prescribed.       Fall Risk Fall Risk  02/05/2019 01/30/2018 01/24/2017 12/14/2015 12/08/2014  Falls in the past year? 0 0 No No No  Number falls in past yr: 0 - - - -  Injury with Fall? 0 - - - -  Risk for fall due to : Medication side effect - - - -  Follow up Falls evaluation completed;Falls prevention discussed - - - -   Is the patient's home free of loose throw rugs in walkways, pet beds, electrical cords, etc?   yes      Grab bars in the bathroom? yes      Handrails on the stairs?   yes      Adequate lighting?   yes  Timed Get Up and Go performed: N/A  Depression Screen PHQ 2/9 Scores 02/05/2019 01/30/2018 01/24/2017 12/14/2015  PHQ - 2  Score 0 0 0 0  PHQ- 9 Score 0 0 0 -     Cognitive Function MMSE - Mini Mental State Exam 02/05/2019 01/30/2018 01/24/2017 12/14/2015  Orientation to time 5 5 5 5   Orientation to Place 5 5 5 5   Registration 3 3 3 3   Attention/ Calculation 5 0 0 0  Recall 3 3 3 3   Language- name 2 objects - 0 0 0  Language- repeat 1 1 1 1   Language- follow 3 step command - 3 3 3   Language- read & follow direction - 0 0 0  Write a sentence - 0 0 0  Copy design - 0 0 0  Total score - 20 20 20   Mini Cog  Mini-Cog screen was completed. Maximum score is 22. A value of 0 denotes this part of the MMSE was not completed or the patient failed this part of the Mini-Cog screening.       Immunization History  Administered Date(s) Administered  . Pneumococcal Conjugate-13 02/04/2018  . Tdap 06/05/2011    Qualifies for Shingles Vaccine? declined  Screening Tests Health Maintenance  Topic Date Due  . HEMOGLOBIN A1C  02/01/2019  . PNA vac Low Risk Adult (2 of 2 - PPSV23) 02/05/2019  . INFLUENZA VACCINE  12/13/2025 (Originally 08/16/2018)  . FOOT EXAM  08/05/2019  . MAMMOGRAM  08/06/2019  . OPHTHALMOLOGY EXAM  08/28/2019  . TETANUS/TDAP  06/04/2021  . COLONOSCOPY  06/21/2022  . DEXA SCAN  Completed  . Hepatitis C Screening  Completed    Cancer Screenings: Lung: Low Dose CT Chest recommended if Age 49-80 years, 30 pack-year currently smoking OR have quit w/in 15years. Patient does not qualify. Breast:  Up to date on Mammogram? Yes   Up to date of Bone Density/Dexa? Yes Colorectal: completed 06/20/2017  Additional Screenings:  Hepatitis C Screening: 12/14/2015     Plan:   Patient will maintain and continue  medications as prescribed.   I have personally reviewed and noted the following in the patient's chart:   . Medical and social history . Use of alcohol, tobacco or illicit drugs  . Current medications and supplements . Functional ability and status . Nutritional status . Physical activity .  Advanced directives . List of other physicians . Hospitalizations, surgeries, and ER visits in previous 12 months . Vitals . Screenings to include cognitive, depression, and falls . Referrals and appointments  In addition, I have reviewed and discussed with patient certain preventive protocols, quality metrics, and best practice recommendations. A written personalized care plan for preventive services as well as general preventive health recommendations were provided to patient.     Andrez Grime, LPN  [phone]

## 2019-02-11 ENCOUNTER — Ambulatory Visit (INDEPENDENT_AMBULATORY_CARE_PROVIDER_SITE_OTHER): Payer: PPO | Admitting: Family Medicine

## 2019-02-11 ENCOUNTER — Other Ambulatory Visit: Payer: Self-pay

## 2019-02-11 ENCOUNTER — Encounter: Payer: Self-pay | Admitting: Family Medicine

## 2019-02-11 VITALS — BP 128/62 | HR 69 | Temp 97.1°F | Ht 63.0 in | Wt 193.0 lb

## 2019-02-11 DIAGNOSIS — E6609 Other obesity due to excess calories: Secondary | ICD-10-CM

## 2019-02-11 DIAGNOSIS — I10 Essential (primary) hypertension: Secondary | ICD-10-CM | POA: Diagnosis not present

## 2019-02-11 DIAGNOSIS — E1169 Type 2 diabetes mellitus with other specified complication: Secondary | ICD-10-CM | POA: Diagnosis not present

## 2019-02-11 DIAGNOSIS — Z6834 Body mass index (BMI) 34.0-34.9, adult: Secondary | ICD-10-CM

## 2019-02-11 DIAGNOSIS — E119 Type 2 diabetes mellitus without complications: Secondary | ICD-10-CM | POA: Diagnosis not present

## 2019-02-11 DIAGNOSIS — M810 Age-related osteoporosis without current pathological fracture: Secondary | ICD-10-CM

## 2019-02-11 DIAGNOSIS — E785 Hyperlipidemia, unspecified: Secondary | ICD-10-CM | POA: Diagnosis not present

## 2019-02-11 DIAGNOSIS — Z23 Encounter for immunization: Secondary | ICD-10-CM

## 2019-02-11 DIAGNOSIS — Z0000 Encounter for general adult medical examination without abnormal findings: Secondary | ICD-10-CM

## 2019-02-11 DIAGNOSIS — E559 Vitamin D deficiency, unspecified: Secondary | ICD-10-CM

## 2019-02-11 MED ORDER — METFORMIN HCL 1000 MG PO TABS: 1000.0000 mg | ORAL_TABLET | Freq: Two times a day (BID) | ORAL | 3 refills | Status: DC

## 2019-02-11 NOTE — Assessment & Plan Note (Signed)
Vitamin D level is therapeutic with current supplementation Disc importance of this to bone and overall health Level of 55

## 2019-02-11 NOTE — Progress Notes (Signed)
Subjective:    Patient ID: Amy Fry, female    DOB: 1945/09/09, 74 y.o.   MRN: [id number]  This visit occurred during the SARS-CoV-2 public health emergency.  Safety protocols were in place, including screening questions prior to the visit, additional usage of staff PPE, and extensive cleaning of exam room while observing appropriate contact time as indicated for disinfecting solutions.    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  34.19 kg/m   Pt had amw on 1/21 Noted she needs pna 23   Planning to get the covid vaccine when available   Flu vaccine - does not take   Mammogram 7/20 (had to get a re check)  Self breast exam -no lumps or changes   Colonoscopy 6/19 with 5 y recall   dexa 8/19   - [facility]  Taking boniva and tolerating it well  No falls  No fractures  D level is 55  She has a treadmill -needs to used   bp is stable today  No cp or palpitations or headaches or edema  No side effects to medicines  BP Readings from Last 3 Encounters:  02/11/19 128/62  12/04/18 124/82  08/05/18 138/78     Pulse Readings from Last 3 Encounters:  02/11/19 69  12/04/18 70  08/05/18 71    DM2 Lab Results  Component Value Date   HGBA1C 7.1 (H) 02/05/2019  up from 6.8 in the summer  On ace and statin  utd eye exam   Does not eat regular sweets often  Drinks unsweet tea - with artificial sweetener  Loves bread   Hyperlipidemia Lab Results  Component Value Date   CHOL 163 02/05/2019   CHOL 158 08/01/2018   CHOL 147 01/30/2018   Lab Results  Component Value Date   HDL 52.60 02/05/2019   HDL 50.40  08/01/2018   HDL 54.40 01/30/2018   Lab Results  Component Value Date   LDLCALC 85 02/05/2019   LDLCALC 81 08/01/2018   LDLCALC 66 01/30/2018   Lab Results  Component Value Date   TRIG 129.0 02/05/2019   TRIG 133.0 08/01/2018   TRIG 132.0 01/30/2018   Lab Results  Component Value Date   CHOLHDL 3 02/05/2019   CHOLHDL 3 08/01/2018   CHOLHDL 3 01/30/2018   No results found for: LDLDIRECT Stable  Gemfibrozil and lovastatin  No fried foods   Patient Active Problem List   Diagnosis Date Noted  . Need for hepatitis C screening test 12/13/2015  . Obesity 12/11/2014  . Herpes simplex 03/16/2014  . Pedal edema 06/15/2013  . History of fracture of arm 06/09/2013  . Encounter for Medicare annual wellness exam 06/17/2012  . Routine general medical examination at a health care facility 02/18/2011  . Vitamin D deficiency 07/30/2008  . DERMATOPHYTOSIS, NAIL 07/26/2006  . Diabetes type 2, controlled ([place]) 07/25/2006  . Hyperlipidemia associated with type 2 diabetes mellitus ([place]) 07/25/2006  . Essential hypertension 07/25/2006  . Osteoporosis 07/25/2006   Past Medical History:  Diagnosis Date  . Arthritis   .  Diabetes mellitus    type II  . Early cataracts, bilateral    no sx yet   . Hyperlipidemia   . Hypertension   . Osteopenia    Past Surgical History:  Procedure Laterality Date  . CYSTOSCOPY  10-1999  . FRACTURE SURGERY  2010   rib fx- from fall   . LAPAROSCOPIC CHOLECYSTECTOMY    . OOPHORECTOMY    . ORIF HUMERUS FRACTURE Left 06/09/2013   Procedure: OPEN REDUCTION INTERNAL FIXATION (ORIF) PROXIMAL HUMERUS FRACTURE;  Surgeon: Rozanna Box, MD;  Location: [facility];  Service: Orthopedics;  Laterality: Left;   Social History   Tobacco Use  . Smoking status: Former Research scientist (life sciences)  . Smokeless tobacco: Never Used  . Tobacco comment: a little as a teenager  Substance Use Topics  . Alcohol use: No    Alcohol/week: 0.0 standard drinks  . Drug use: No   Family History    Problem Relation Age of Onset  . Heart attack Mother   . Heart attack Brother   . Stroke Brother   . Depression Sister   . Colon cancer Neg Hx   . Stomach cancer Neg Hx   . Esophageal cancer Neg Hx   . Rectal cancer Neg Hx    Allergies  Allergen Reactions  . Alendronate Sodium     REACTION: reaction not known  . Atorvastatin     REACTION: headaches  . Pravastatin Sodium     REACTION: headaches and arm pain   Current Outpatient Medications on File Prior to Visit  Medication Sig Dispense Refill  . Ascorbic Acid (VITAMIN C) 1000 MG tablet Take 500 mg by mouth daily.     Marland Kitchen aspirin 81 MG EC tablet Take 81 mg by mouth daily.      . Calcium Carbonate-Vit D-Min (CALCIUM 1200 PO) Take 1 tablet by mouth 2 (two) times daily.    . Cholecalciferol (VITAMIN D) 2000 UNITS CAPS Take 2,000 Units by mouth daily.     Marland Kitchen CRANBERRY PO Take 4,200 Units by mouth daily.    Marland Kitchen gemfibrozil (LOPID) 600 MG tablet Take 1 tablet (600 mg total) by mouth 2 (two) times daily. 180 tablet 3  . ibandronate (BONIVA) 150 MG tablet     . lisinopril (ZESTRIL) 5 MG tablet Take 1 tablet (5 mg total) by mouth daily. 90 tablet 3  . lovastatin (MEVACOR) 20 MG tablet Take 1 tablet (20 mg total) by mouth daily. 90 tablet 3  . Omega-3 300 MG CAPS Take 300 mg by mouth 2 (two) times daily.     Marland Kitchen spironolactone (ALDACTONE) 25 MG tablet TAKE 1/2 TABLET BY MOUTH ONCE A DAY 45 tablet 3  . vitamin B-12 (CYANOCOBALAMIN) 1000 MCG tablet Take 1,000 mcg by mouth daily.     No current facility-administered medications on file prior to visit.    Review of Systems  Constitutional: Negative for activity change, appetite change, fatigue, fever and unexpected weight change.  HENT: Negative for congestion, ear pain, rhinorrhea, sinus pressure and sore throat.   Eyes: Negative for pain, redness and visual disturbance.  Respiratory: Negative for cough, shortness of breath and wheezing.   Cardiovascular: Negative for chest pain and  palpitations.  Gastrointestinal: Negative for abdominal pain, blood in stool, constipation and diarrhea.  Endocrine: Negative for polydipsia and polyuria.  Genitourinary: Negative for dysuria, frequency and urgency.  Musculoskeletal: Negative for arthralgias, back pain and myalgias.  Skin: Negative for pallor and rash.  Allergic/Immunologic: Negative for environmental allergies.  Neurological:  Negative for dizziness, syncope and headaches.  Hematological: Negative for adenopathy. Does not bruise/bleed easily.  Psychiatric/Behavioral: Negative for decreased concentration and dysphoric mood. The patient is not nervous/anxious.        Objective:   Physical Exam Constitutional:      General: She is not in acute distress.    Appearance: Normal appearance. She is well-developed. She is obese. She is not ill-appearing or diaphoretic.  HENT:     Head: Normocephalic and atraumatic.     Right Ear: Tympanic membrane, ear canal and external ear normal.     Left Ear: Tympanic membrane, ear canal and external ear normal.     Nose: Nose normal. No congestion.     Mouth/Throat:     Mouth: Mucous membranes are moist.     Pharynx: Oropharynx is clear. No posterior oropharyngeal erythema.  Eyes:     General: No scleral icterus.    Extraocular Movements: Extraocular movements intact.     Conjunctiva/sclera: Conjunctivae normal.     Pupils: Pupils are equal, round, and reactive to light.  Neck:     Thyroid: No thyromegaly.     Vascular: No carotid bruit or JVD.  Cardiovascular:     Rate and Rhythm: Normal rate and regular rhythm.     Pulses: Normal pulses.     Heart sounds: Normal heart sounds. No gallop.   Pulmonary:     Effort: Pulmonary effort is normal. No respiratory distress.     Breath sounds: Normal breath sounds. No wheezing.     Comments: Good air exch Chest:     Chest wall: No tenderness.  Abdominal:     General: Bowel sounds are normal. There is no distension or abdominal bruit.       Palpations: Abdomen is soft. There is no mass.     Tenderness: There is no abdominal tenderness.     Hernia: No hernia is present.  Genitourinary:    Comments: Breast exam: No mass, nodules, thickening, tenderness, bulging, retraction, inflamation, nipple discharge or skin changes noted.  No axillary or clavicular LA.     Musculoskeletal:        General: No tenderness. Normal range of motion.     Cervical back: Normal range of motion and neck supple. No rigidity. No muscular tenderness.     Right lower leg: No edema.     Left lower leg: No edema.  Lymphadenopathy:     Cervical: No cervical adenopathy.  Skin:    General: Skin is warm and dry.     Coloration: Skin is not pale.     Findings: No erythema or rash.     Comments: Solar lentigines diffusely   Neurological:     Mental Status: She is alert. Mental status is at baseline.     Cranial Nerves: No cranial nerve deficit.     Motor: No abnormal muscle tone.     Coordination: Coordination normal.     Gait: Gait normal.     Deep Tendon Reflexes: Reflexes are normal and symmetric. Reflexes normal.  Psychiatric:        Mood and Affect: Mood normal.        Cognition and Memory: Cognition and memory normal.     Comments: Pleasant            Assessment & Plan:   Problem List Items Addressed This Visit      Cardiovascular and Mediastinum   Essential hypertension    bp in fair control at this time  BP Readings from Last 1 Encounters:  02/11/19 128/62   No changes needed Most recent labs reviewed  Disc lifstyle change with low sodium diet and exercise          Endocrine   Diabetes type 2, controlled (HCC)    Lab Results  Component Value Date   HGBA1C 7.1 (H) 02/05/2019   Worse control with poor eating and less exercise  disc imp of low glycemic diet and wt loss to prevent DM2   Increasing metformin to 1000 mg bid (update if side eff)  Taking statin and ace utd eye exam  Good foot care F/u 3 mo       Relevant Medications   metFORMIN (GLUCOPHAGE) 1000 MG tablet   Hyperlipidemia associated with type 2 diabetes mellitus (HCC)    Disc goals for lipids and reasons to control them Rev last labs with pt Rev low sat fat diet in detail Adv to continue lovastatin and gemfibrozil Enc less trans and sat fats in diet       Relevant Medications   metFORMIN (GLUCOPHAGE) 1000 MG tablet     Musculoskeletal and Integument   Osteoporosis    Followed by gyn - last dexa 8/19  Taking boniva and tolerating well No falls or fractures  Vit D level good at 86 Enc more exercise - (treadmill)        Other   Vitamin D deficiency    Vitamin D level is therapeutic with current supplementation Disc importance of this to bone and overall health Level of 55      Routine general medical examination at a health care facility - Primary    Reviewed health habits including diet and exercise and skin cancer prevention Reviewed appropriate screening tests for age  Also reviewed health mt list, fam hx and immunization status , as well as social and family history   See HPI amw reviewed  Labs reviewed  pna 23 vaccine given Pt plans to get her covid vaccine in 2 weeks or more when available  Enc use of treadmill and better diet  Continue f/u gyn for OP with Boniva       Obesity    Discussed how this problem influences overall health and the risks it imposes  Reviewed plan for weight loss with lower calorie diet (via better food choices and also portion control or program like weight watchers) and exercise building up to or more than 30 minutes 5 days per week including some aerobic activity         Relevant Medications   metFORMIN (GLUCOPHAGE) 1000 MG tablet

## 2019-02-11 NOTE — Assessment & Plan Note (Signed)
Discussed how this problem influences overall health and the risks it imposes  Reviewed plan for weight loss with lower calorie diet (via better food choices and also portion control or program like weight watchers) and exercise building up to or more than 30 minutes 5 days per week including some aerobic activity    

## 2019-02-11 NOTE — Assessment & Plan Note (Signed)
Followed by gyn - last dexa 8/19  Taking boniva and tolerating well No falls or fractures  Vit D level good at 55 Enc more exercise - (treadmill)

## 2019-02-11 NOTE — Patient Instructions (Addendum)
Pneumonia vaccine today   Please wait at least 2 weeks to get your covid vaccine so it works well   For exercise daily - aim for at least 30 minutes or treadmill or video or outdoor work daily  Important for all of of your medical issues and general health   For diabetes : Try to get most of your carbohydrates from produce (with the exception of white potatoes)  Eat less bread/pasta/rice/snack foods/cereals/sweets and other items from the middle of the grocery store (processed carbs)   Increase metformin to 1000 mg twice daily  Let us know if any problems   Follow up in 3 months   Otherwise labs look stable

## 2019-02-11 NOTE — Assessment & Plan Note (Signed)
Lab Results  Component Value Date   HGBA1C 7.1 (H) 02/05/2019   Worse control with poor eating and less exercise  disc imp of low glycemic diet and wt loss to prevent DM2   Increasing metformin to 1000 mg bid (update if side eff)  Taking statin and ace utd eye exam  Good foot care F/u 3 mo

## 2019-02-11 NOTE — Assessment & Plan Note (Signed)
Reviewed health habits including diet and exercise and skin cancer prevention Reviewed appropriate screening tests for age  Also reviewed health mt list, fam hx and immunization status , as well as social and family history   See HPI amw reviewed  Labs reviewed  pna 23 vaccine given Pt plans to get her covid vaccine in 2 weeks or more when available  Enc use of treadmill and better diet  Continue f/u gyn for OP with [facility]

## 2019-02-11 NOTE — Assessment & Plan Note (Addendum)
Disc goals for lipids and reasons to control them Rev last labs with pt Rev low sat fat diet in detail Adv to continue lovastatin and gemfibrozil Enc less trans and sat fats in diet

## 2019-02-11 NOTE — Assessment & Plan Note (Signed)
bp in fair control at this time  BP Readings from Last 1 Encounters:  02/11/19 128/62   No changes needed Most recent labs reviewed  Disc lifstyle change with low sodium diet and exercise

## 2019-04-24 ENCOUNTER — Telehealth: Payer: Self-pay | Admitting: Gastroenterology

## 2019-04-24 NOTE — Telephone Encounter (Signed)
Ok with me 

## 2019-04-24 NOTE — Telephone Encounter (Signed)
Dr. Havery Moros,  This pt has had a colonoscopy with you and her husband see's Dr. Hilarie Fredrickson She is requesting to change doctors  And would like to be seen by Dr. Hilarie Fredrickson in the futrue .  Will you approve this switch?

## 2019-04-24 NOTE — Telephone Encounter (Signed)
Dr. Riley Kill,  This pt would like switch providers and would like see you.  I have asked Dr. Havery Moros and he has approved. Would you accept this patient? (Her husband sees you.)

## 2019-04-24 NOTE — Telephone Encounter (Signed)
Fine by me if okay with Dr. Pyrtle 

## 2019-05-13 ENCOUNTER — Ambulatory Visit (INDEPENDENT_AMBULATORY_CARE_PROVIDER_SITE_OTHER): Payer: PPO | Admitting: Family Medicine

## 2019-05-13 ENCOUNTER — Other Ambulatory Visit: Payer: Self-pay

## 2019-05-13 ENCOUNTER — Encounter: Payer: Self-pay | Admitting: Family Medicine

## 2019-05-13 VITALS — BP 126/82 | HR 66 | Temp 98.0°F | Wt 188.0 lb

## 2019-05-13 DIAGNOSIS — I10 Essential (primary) hypertension: Secondary | ICD-10-CM | POA: Diagnosis not present

## 2019-05-13 DIAGNOSIS — E785 Hyperlipidemia, unspecified: Secondary | ICD-10-CM | POA: Diagnosis not present

## 2019-05-13 DIAGNOSIS — E119 Type 2 diabetes mellitus without complications: Secondary | ICD-10-CM

## 2019-05-13 DIAGNOSIS — E1169 Type 2 diabetes mellitus with other specified complication: Secondary | ICD-10-CM

## 2019-05-13 DIAGNOSIS — Z6833 Body mass index (BMI) 33.0-33.9, adult: Secondary | ICD-10-CM

## 2019-05-13 DIAGNOSIS — E6609 Other obesity due to excess calories: Secondary | ICD-10-CM

## 2019-05-13 LAB — POCT GLYCOSYLATED HEMOGLOBIN (HGB A1C): Hemoglobin A1C: 6.5 % — AB (ref 4.0–5.6)

## 2019-05-13 NOTE — Progress Notes (Signed)
Subjective:    Patient ID: Amy Fry, female    DOB: 03-16-1945, 74 y.o.   MRN: [id number]  This visit occurred during the SARS-CoV-2 public health emergency.  Safety protocols were in place, including screening questions prior to the visit, additional usage of staff PPE, and extensive cleaning of exam room while observing appropriate contact time as indicated for disinfecting solutions.    HPI Pt presents for f/u of chronic medical problems   Wt Readings from Last 3 Encounters:  05/13/19 188 lb (85.3 kg)  02/11/19 193 lb (87.5 kg)  12/04/18 193 lb 7 oz (87.7 kg)   33.30 kg/m   Wt loss noted  Is more active-getting outdoor   She had her covid vaccines   HTN bp is stable today  No cp or palpitations or headaches or edema  No side effects to medicines  BP Readings from Last 3 Encounters:  05/13/19 126/82  02/11/19 128/62  12/04/18 124/82      DM2 Last A1C was up at 7.1 after poor eating  We inc metformin to 1000 mg bid  She is eating -back to normal   (less carbs/sweets)  Taking statin and ace  utd eye exam   Lab Results  Component Value Date   HGBA1C 6.5 (A) 05/13/2019   Improved today! Happy about that   Hyperlipidemia Lab Results  Component Value Date   CHOL 163 02/05/2019   HDL 52.60 02/05/2019   LDLCALC 85 02/05/2019   TRIG 129.0 02/05/2019   CHOLHDL 3 02/05/2019   Taking lovastatin and gemfibrozil  She has [facility] screening upcoming  occ pain in R lower leg-thinks from veins No claudication  Patient Active Problem List   Diagnosis Date Noted  . Need for hepatitis C screening test 12/13/2015  . Obesity 12/11/2014  . Herpes simplex 03/16/2014  . Pedal edema 06/15/2013  . History of fracture of arm 06/09/2013  . Encounter for Medicare annual wellness exam 06/17/2012  . Routine general medical examination at a health care facility 02/18/2011  . Vitamin D deficiency 07/30/2008  . DERMATOPHYTOSIS, NAIL 07/26/2006  . Diabetes type 2,  controlled ([place]) 07/25/2006  . Hyperlipidemia associated with type 2 diabetes mellitus ([place]) 07/25/2006  . Essential hypertension 07/25/2006  . Osteoporosis 07/25/2006   Past Medical History:  Diagnosis Date  . Arthritis   . Diabetes mellitus    type II  . Early cataracts, bilateral    no sx yet   . Hyperlipidemia   . Hypertension   . Osteopenia    Past Surgical History:  Procedure Laterality Date  . CYSTOSCOPY  10-1999  . FRACTURE SURGERY  2010   rib fx- from fall   . LAPAROSCOPIC CHOLECYSTECTOMY    . OOPHORECTOMY    . ORIF HUMERUS FRACTURE Left 06/09/2013   Procedure: OPEN REDUCTION INTERNAL FIXATION (ORIF) PROXIMAL HUMERUS FRACTURE;  Surgeon: Rozanna Box, MD;  Location: [facility];  Service: Orthopedics;  Laterality: Left;   Social History   Tobacco Use  . Smoking status: Former Research scientist (life sciences)  . Smokeless tobacco: Never Used  . Tobacco comment: a little as a teenager  Substance Use Topics  . Alcohol use: No    Alcohol/week: 0.0 standard drinks  . Drug use: No   Family History  Problem Relation Age of Onset  . Heart attack Mother   . Heart attack Brother   . Stroke Brother   . Depression Sister   . Colon cancer Neg Hx   . Stomach cancer Neg Hx   .  Esophageal cancer Neg Hx   . Rectal cancer Neg Hx    Allergies  Allergen Reactions  . Alendronate Sodium     REACTION: reaction not known  . Atorvastatin     REACTION: headaches  . Pravastatin Sodium     REACTION: headaches and arm pain   Current Outpatient Medications on File Prior to Visit  Medication Sig Dispense Refill  . Ascorbic Acid (VITAMIN C) 1000 MG tablet Take 500 mg by mouth daily.     Marland Kitchen aspirin 81 MG EC tablet Take 81 mg by mouth daily.      . Calcium Carbonate-Vit D-Min (CALCIUM 1200 PO) Take 1 tablet by mouth 2 (two) times daily.    . Cholecalciferol (VITAMIN D) 2000 UNITS CAPS Take 2,000 Units by mouth daily.     Marland Kitchen CRANBERRY PO Take 4,200 Units by mouth daily.    Marland Kitchen gemfibrozil (LOPID) 600 MG tablet  Take 1 tablet (600 mg total) by mouth 2 (two) times daily. 180 tablet 3  . ibandronate (BONIVA) 150 MG tablet     . lisinopril (ZESTRIL) 5 MG tablet Take 1 tablet (5 mg total) by mouth daily. 90 tablet 3  . lovastatin (MEVACOR) 20 MG tablet Take 1 tablet (20 mg total) by mouth daily. 90 tablet 3  . metFORMIN (GLUCOPHAGE) 1000 MG tablet Take 1 tablet (1,000 mg total) by mouth 2 (two) times daily with a meal. 180 tablet 3  . Omega-3 300 MG CAPS Take 300 mg by mouth 2 (two) times daily.     Marland Kitchen spironolactone (ALDACTONE) 25 MG tablet TAKE 1/2 TABLET BY MOUTH ONCE A DAY 45 tablet 3  . vitamin B-12 (CYANOCOBALAMIN) 1000 MCG tablet Take 1,000 mcg by mouth daily.     No current facility-administered medications on file prior to visit.    Review of Systems  Constitutional: Negative for activity change, appetite change, fatigue, fever and unexpected weight change.  HENT: Negative for congestion, ear pain, rhinorrhea, sinus pressure and sore throat.   Eyes: Negative for pain, redness and visual disturbance.  Respiratory: Negative for cough, shortness of breath and wheezing.   Cardiovascular: Negative for chest pain and palpitations.  Gastrointestinal: Negative for abdominal pain, blood in stool, constipation and diarrhea.  Endocrine: Negative for polydipsia and polyuria.  Genitourinary: Negative for dysuria, frequency and urgency.  Musculoskeletal: Negative for arthralgias, back pain and myalgias.  Skin: Negative for pallor and rash.  Allergic/Immunologic: Negative for environmental allergies.  Neurological: Negative for dizziness, syncope and headaches.  Hematological: Negative for adenopathy. Does not bruise/bleed easily.  Psychiatric/Behavioral: Negative for decreased concentration and dysphoric mood. The patient is not nervous/anxious.        Objective:   Physical Exam Constitutional:      General: She is not in acute distress.    Appearance: Normal appearance. She is well-developed. She is  obese. She is not ill-appearing.  HENT:     Head: Normocephalic and atraumatic.  Eyes:     General: No scleral icterus.    Conjunctiva/sclera: Conjunctivae normal.     Pupils: Pupils are equal, round, and reactive to light.  Neck:     Thyroid: No thyromegaly.     Vascular: No carotid bruit or JVD.  Cardiovascular:     Rate and Rhythm: Normal rate and regular rhythm.     Heart sounds: Normal heart sounds. No gallop.   Pulmonary:     Effort: Pulmonary effort is normal. No respiratory distress.     Breath sounds: Normal breath sounds.  No wheezing or rales.  Abdominal:     General: Bowel sounds are normal. There is no distension or abdominal bruit.     Palpations: Abdomen is soft. There is no mass.     Tenderness: There is no abdominal tenderness.  Musculoskeletal:     Cervical back: Normal range of motion and neck supple.  Lymphadenopathy:     Cervical: No cervical adenopathy.  Skin:    General: Skin is warm and dry.     Findings: No rash.  Neurological:     Mental Status: She is alert.     Sensory: No sensory deficit.     Coordination: Coordination normal.     Deep Tendon Reflexes: Reflexes are normal and symmetric. Reflexes normal.           Assessment & Plan:   Problem List Items Addressed This Visit      Cardiovascular and Mediastinum   Essential hypertension    bp in fair control at this time  BP Readings from Last 1 Encounters:  05/13/19 126/82   No changes needed Most recent labs reviewed  Disc lifstyle change with low sodium diet and exercise          Endocrine   Diabetes type 2, controlled ([place]) - Primary    Lab Results  Component Value Date   HGBA1C 6.5 (A) 05/13/2019   Improved with inc in metformin to 1000 mg bid  On statin and ace  Eating better and more exercise  Enc further wt loss F/u approx 6 mo        Relevant Orders   POCT glycosylated hemoglobin (Hb A1C) (Completed)   Hyperlipidemia associated with type 2 diabetes mellitus  (HCC)    Continues lovastatin and gemfibrozil Disc goals for lipids and reasons to control them Rev last labs with pt Rev low sat fat diet in detail         Other   Obesity    Wt is down 5 lb  Enc her to keep working on it   Discussed how this problem influences overall health and the risks it imposes  Reviewed plan for weight loss with lower calorie diet (via better food choices and also portion control or program like weight watchers) and exercise building up to or more than 30 minutes 5 days per week including some aerobic activity

## 2019-05-13 NOTE — Assessment & Plan Note (Signed)
Wt is down 5 lb  Enc her to keep working on it   Discussed how this problem influences overall health and the risks it imposes  Reviewed plan for weight loss with lower calorie diet (via better food choices and also portion control or program like weight watchers) and exercise building up to or more than 30 minutes 5 days per week including some aerobic activity

## 2019-05-13 NOTE — Patient Instructions (Addendum)
A1C is improved at 6.5  Keep loosing weight and exercising  Continue metformin at the current dose   Keep up the good work

## 2019-05-13 NOTE — Assessment & Plan Note (Signed)
Continues lovastatin and gemfibrozil Disc goals for lipids and reasons to control them Rev last labs with pt Rev low sat fat diet in detail

## 2019-05-13 NOTE — Assessment & Plan Note (Signed)
Lab Results  Component Value Date   HGBA1C 6.5 (A) 05/13/2019   Improved with inc in metformin to 1000 mg bid  On statin and ace  Eating better and more exercise  Enc further wt loss F/u approx 6 mo

## 2019-05-13 NOTE — Assessment & Plan Note (Signed)
bp in fair control at this time  BP Readings from Last 1 Encounters:  05/13/19 126/82   No changes needed Most recent labs reviewed  Disc lifstyle change with low sodium diet and exercise

## 2019-05-26 ENCOUNTER — Other Ambulatory Visit: Payer: Self-pay | Admitting: Family Medicine

## 2019-09-03 DIAGNOSIS — E119 Type 2 diabetes mellitus without complications: Secondary | ICD-10-CM | POA: Diagnosis not present

## 2019-09-03 DIAGNOSIS — H25013 Cortical age-related cataract, bilateral: Secondary | ICD-10-CM | POA: Diagnosis not present

## 2019-09-03 DIAGNOSIS — H5203 Hypermetropia, bilateral: Secondary | ICD-10-CM | POA: Diagnosis not present

## 2019-09-03 LAB — HM DIABETES EYE EXAM

## 2019-10-17 ENCOUNTER — Telehealth: Payer: Self-pay | Admitting: Family Medicine

## 2019-10-17 DIAGNOSIS — I10 Essential (primary) hypertension: Secondary | ICD-10-CM

## 2019-10-17 DIAGNOSIS — E785 Hyperlipidemia, unspecified: Secondary | ICD-10-CM

## 2019-10-17 DIAGNOSIS — E119 Type 2 diabetes mellitus without complications: Secondary | ICD-10-CM

## 2019-10-17 NOTE — Telephone Encounter (Signed)
-----   Message from Cloyd Stagers, RT sent at 10/06/2019  2:33 PM EDT ----- Regarding: Lab Orders for Monday 10.4.2021 Please place lab orders for Monday 10.4.2021, office visit for f/u on Monday 10.4.2021 Thank you, Dyke Maes RT(R)

## 2019-10-19 ENCOUNTER — Other Ambulatory Visit: Payer: Self-pay

## 2019-10-19 ENCOUNTER — Other Ambulatory Visit (INDEPENDENT_AMBULATORY_CARE_PROVIDER_SITE_OTHER): Payer: PPO

## 2019-10-19 DIAGNOSIS — I10 Essential (primary) hypertension: Secondary | ICD-10-CM

## 2019-10-19 DIAGNOSIS — E785 Hyperlipidemia, unspecified: Secondary | ICD-10-CM

## 2019-10-19 DIAGNOSIS — E1169 Type 2 diabetes mellitus with other specified complication: Secondary | ICD-10-CM

## 2019-10-19 DIAGNOSIS — E119 Type 2 diabetes mellitus without complications: Secondary | ICD-10-CM | POA: Diagnosis not present

## 2019-10-19 LAB — COMPREHENSIVE METABOLIC PANEL
ALT: 17 U/L (ref 0–35)
AST: 18 U/L (ref 0–37)
Albumin: 4.3 g/dL (ref 3.5–5.2)
Alkaline Phosphatase: 48 U/L (ref 39–117)
BUN: 18 mg/dL (ref 6–23)
CO2: 25 mEq/L (ref 19–32)
Calcium: 9.4 mg/dL (ref 8.4–10.5)
Chloride: 105 mEq/L (ref 96–112)
Creatinine, Ser: 0.9 mg/dL (ref 0.40–1.20)
GFR: 61.37 mL/min (ref >60.00)
Glucose, Bld: 126 mg/dL — ABNORMAL HIGH (ref 70–99)
Potassium: 4.4 mEq/L (ref 3.5–5.1)
Sodium: 140 mEq/L (ref 135–145)
Total Bilirubin: 0.3 mg/dL (ref 0.2–1.2)
Total Protein: 6.8 g/dL (ref 6.0–8.3)

## 2019-10-19 LAB — LIPID PANEL
Cholesterol: 155 mg/dL (ref 0–200)
HDL: 48.6 mg/dL (ref >39.00)
LDL Cholesterol: 75 mg/dL (ref 0–99)
NonHDL: 106.89
Total CHOL/HDL Ratio: 3
Triglycerides: 159 mg/dL — ABNORMAL HIGH (ref 0.0–149.0)
VLDL: 31.8 mg/dL (ref 0.0–40.0)

## 2019-10-19 LAB — HEMOGLOBIN A1C: Hgb A1c MFr Bld: 6.9 % — ABNORMAL HIGH (ref 4.6–6.5)

## 2019-10-26 ENCOUNTER — Other Ambulatory Visit: Payer: Self-pay

## 2019-10-26 ENCOUNTER — Encounter: Payer: Self-pay | Admitting: Family Medicine

## 2019-10-26 ENCOUNTER — Ambulatory Visit (INDEPENDENT_AMBULATORY_CARE_PROVIDER_SITE_OTHER): Payer: PPO | Admitting: Family Medicine

## 2019-10-26 VITALS — BP 128/80 | HR 72 | Temp 96.9°F | Ht 63.0 in | Wt 186.0 lb

## 2019-10-26 DIAGNOSIS — E785 Hyperlipidemia, unspecified: Secondary | ICD-10-CM

## 2019-10-26 DIAGNOSIS — Z6832 Body mass index (BMI) 32.0-32.9, adult: Secondary | ICD-10-CM | POA: Diagnosis not present

## 2019-10-26 DIAGNOSIS — E6609 Other obesity due to excess calories: Secondary | ICD-10-CM

## 2019-10-26 DIAGNOSIS — E1169 Type 2 diabetes mellitus with other specified complication: Secondary | ICD-10-CM

## 2019-10-26 DIAGNOSIS — I10 Essential (primary) hypertension: Secondary | ICD-10-CM | POA: Diagnosis not present

## 2019-10-26 DIAGNOSIS — E119 Type 2 diabetes mellitus without complications: Secondary | ICD-10-CM

## 2019-10-26 NOTE — Assessment & Plan Note (Signed)
bp in fair control at this time  BP Readings from Last 1 Encounters:  10/26/19 128/80   No changes needed inst to continue lisinopril 5 mg daily Most recent labs reviewed  Disc lifstyle change with low sodium diet and exercise

## 2019-10-26 NOTE — Patient Instructions (Addendum)
Labs look ok  A1C is 6.9 - we need to keep it below 7 Keep working on a healthy diet   Try to get most of your carbohydrates from produce (with the exception of white potatoes)  Eat less bread/pasta/rice/snack foods/cereals/sweets and other items from the middle of the grocery store (processed carbs)   Add exercise whenever you can   Take care of yourself   At check out - we will send for your eye exam report   Get your covid booster shot after the mammogram   Follow up for physical in February

## 2019-10-26 NOTE — Assessment & Plan Note (Signed)
Lab Results  Component Value Date   HGBA1C 6.9 (H) 10/19/2019   Improved from 7.1 with improved diet and some wt loss  Taking statin and ace  Nl foot exam  Sent for eye exam from this summer (per pt nl)  inst to continue metformin 1000 mg bid  Add exercise when able  F/u next in feb

## 2019-10-26 NOTE — Progress Notes (Signed)
Subjective:    Patient ID: Amy Fry, female    DOB: 27-Nov-1945, 74 y.o.   MRN: [id number]  This visit occurred during the SARS-CoV-2 public health emergency.  Safety protocols were in place, including screening questions prior to the visit, additional usage of staff PPE, and extensive cleaning of exam room while observing appropriate contact time as indicated for disinfecting solutions.    HPI Pt presents for f/u of chronic health problems   Wt Readings from Last 3 Encounters:  10/26/19 186 lb (84.4 kg)  05/13/19 188 lb (85.3 kg)  02/11/19 193 lb (87.5 kg)  down 7 lb since January  Also came back from vacation  32.95 kg/m   Some exercise- walking some   Feeling good overall   Declines flu shots  She has the pfizer covid shots in April  Will get her booster after mammogram early next month   HTN bp is stable today  No cp or palpitations or headaches or edema  No side effects to medicines  BP Readings from Last 3 Encounters:  10/26/19 128/80  05/13/19 126/82  02/11/19 128/62    Taking lisinopril 5 mg daily  Pulse Readings from Last 3 Encounters:  10/26/19 72  05/13/19 66  02/11/19 69     DM2 Lab Results  Component Value Date   HGBA1C 6.9 (H) 10/19/2019   Her A1C was 6.5 in April and 7.1  in January  Taking metformin 1000 mg bid  On ace and statin  Eye care -exam this summer-we need report/no retinopathy   Lab Results  Component Value Date   CREATININE 0.90 10/19/2019   BUN 18 10/19/2019   NA 140 10/19/2019   K 4.4 10/19/2019   CL 105 10/19/2019   CO2 25 10/19/2019    Hyperlipidemia Lab Results  Component Value Date   CHOL 155 10/19/2019   CHOL 163 02/05/2019   CHOL 158 08/01/2018   Lab Results  Component Value Date   HDL 48.60 10/19/2019   HDL 52.60 02/05/2019   HDL 50.40 08/01/2018   Lab Results  Component Value Date   LDLCALC 75 10/19/2019   [place] 85 02/05/2019   LDLCALC 81 08/01/2018   Lab Results  Component Value Date     TRIG 159.0 (H) 10/19/2019   TRIG 129.0 02/05/2019   TRIG 133.0 08/01/2018   Lab Results  Component Value Date   CHOLHDL 3 10/19/2019   CHOLHDL 3 02/05/2019   CHOLHDL 3 08/01/2018   No results found for: LDLDIRECT Taking lovasatin and gemfibrozil  Diet - is pretty good overall  Really watching sugar   Patient Active Problem List   Diagnosis Date Noted  . Need for hepatitis C screening test 12/13/2015  . Obesity 12/11/2014  . Herpes simplex 03/16/2014  . Pedal edema 06/15/2013  . History of fracture of arm 06/09/2013  . Encounter for Medicare annual wellness exam 06/17/2012  . Routine general medical examination at a health care facility 02/18/2011  . Vitamin D deficiency 07/30/2008  . DERMATOPHYTOSIS, NAIL 07/26/2006  . Diabetes type 2, controlled ([place]) 07/25/2006  . Hyperlipidemia associated with type 2 diabetes mellitus ([place]) 07/25/2006  . Essential hypertension 07/25/2006  . Osteoporosis 07/25/2006   Past Medical History:  Diagnosis Date  . Arthritis   . Diabetes mellitus    type II  . Early cataracts, bilateral    no sx yet   . Hyperlipidemia   . Hypertension   . Osteopenia    Past Surgical History:  Procedure  Laterality Date  . CYSTOSCOPY  10-1999  . FRACTURE SURGERY  2010   rib fx- from fall   . LAPAROSCOPIC CHOLECYSTECTOMY    . OOPHORECTOMY    . ORIF HUMERUS FRACTURE Left 06/09/2013   Procedure: OPEN REDUCTION INTERNAL FIXATION (ORIF) PROXIMAL HUMERUS FRACTURE;  Surgeon: Rozanna Box, MD;  Location: [facility];  Service: Orthopedics;  Laterality: Left;   Social History   Tobacco Use  . Smoking status: Former Research scientist (life sciences)  . Smokeless tobacco: Never Used  . Tobacco comment: a little as a teenager  Vaping Use  . Vaping Use: Never used  Substance Use Topics  . Alcohol use: No    Alcohol/week: 0.0 standard drinks  . Drug use: No   Family History  Problem Relation Age of Onset  . Heart attack Mother   . Heart attack Brother   . Stroke Brother   .  Depression Sister   . Colon cancer Neg Hx   . Stomach cancer Neg Hx   . Esophageal cancer Neg Hx   . Rectal cancer Neg Hx    Allergies  Allergen Reactions  . Alendronate Sodium     REACTION: reaction not known  . Atorvastatin     REACTION: headaches  . Pravastatin Sodium     REACTION: headaches and arm pain   Current Outpatient Medications on File Prior to Visit  Medication Sig Dispense Refill  . Ascorbic Acid (VITAMIN C) 1000 MG tablet Take 500 mg by mouth daily.     Marland Kitchen aspirin 81 MG EC tablet Take 81 mg by mouth daily.      . Calcium Carbonate-Vit D-Min (CALCIUM 1200 PO) Take 1 tablet by mouth 2 (two) times daily.    . Cholecalciferol (VITAMIN D) 2000 UNITS CAPS Take 2,000 Units by mouth daily.     Marland Kitchen CRANBERRY PO Take 4,200 Units by mouth daily.    Marland Kitchen gemfibrozil (LOPID) 600 MG tablet TAKE 1 TABLET BY MOUTH TWICE (2) DAILY 180 tablet 1  . ibandronate (BONIVA) 150 MG tablet     . lisinopril (ZESTRIL) 5 MG tablet TAKE 1 TABLET BY MOUTH ONCE DAILY 90 tablet 1  . lovastatin (MEVACOR) 20 MG tablet TAKE 1 TABLET BY MOUTH ONCE DAILY 90 tablet 1  . metFORMIN (GLUCOPHAGE) 1000 MG tablet Take 1 tablet (1,000 mg total) by mouth 2 (two) times daily with a meal. 180 tablet 3  . Omega-3 300 MG CAPS Take 300 mg by mouth 2 (two) times daily.     Marland Kitchen spironolactone (ALDACTONE) 25 MG tablet TAKE 1/2 TABLET BY MOUTH ONCE DAILY 45 tablet 1  . vitamin B-12 (CYANOCOBALAMIN) 1000 MCG tablet Take 1,000 mcg by mouth daily.     No current facility-administered medications on file prior to visit.    Review of Systems  Constitutional: Negative for activity change, appetite change, fatigue, fever and unexpected weight change.  HENT: Negative for congestion, ear pain, rhinorrhea, sinus pressure and sore throat.   Eyes: Negative for pain, redness and visual disturbance.  Respiratory: Negative for cough, shortness of breath and wheezing.   Cardiovascular: Negative for chest pain and palpitations.    Gastrointestinal: Negative for abdominal pain, blood in stool, constipation and diarrhea.  Endocrine: Negative for polydipsia and polyuria.  Genitourinary: Negative for dysuria, frequency and urgency.  Musculoskeletal: Negative for arthralgias, back pain and myalgias.       Frequently stubs her toes- is now better about wearing shoes at home  Skin: Negative for pallor and rash.  Allergic/Immunologic: Negative  for environmental allergies.  Neurological: Negative for dizziness, syncope and headaches.  Hematological: Negative for adenopathy. Does not bruise/bleed easily.  Psychiatric/Behavioral: Negative for decreased concentration and dysphoric mood. The patient is not nervous/anxious.        Objective:   Physical Exam Constitutional:      General: She is not in acute distress.    Appearance: Normal appearance. She is well-developed. She is obese. She is not ill-appearing.  HENT:     Head: Normocephalic and atraumatic.  Eyes:     Conjunctiva/sclera: Conjunctivae normal.     Pupils: Pupils are equal, round, and reactive to light.  Neck:     Thyroid: No thyromegaly.     Vascular: No carotid bruit or JVD.  Cardiovascular:     Rate and Rhythm: Normal rate and regular rhythm.     Heart sounds: Normal heart sounds. No gallop.   Pulmonary:     Effort: Pulmonary effort is normal. No respiratory distress.     Breath sounds: Normal breath sounds. No wheezing or rales.  Abdominal:     General: Bowel sounds are normal. There is no distension or abdominal bruit.     Palpations: Abdomen is soft. There is no mass.     Tenderness: There is no abdominal tenderness.  Musculoskeletal:     Cervical back: Normal range of motion and neck supple.     Right lower leg: No edema.     Left lower leg: No edema.  Lymphadenopathy:     Cervical: No cervical adenopathy.  Skin:    General: Skin is warm and dry.     Findings: No rash.  Neurological:     Mental Status: She is alert.     Sensory: No  sensory deficit.     Coordination: Coordination normal.     Deep Tendon Reflexes: Reflexes are normal and symmetric. Reflexes normal.  Psychiatric:        Mood and Affect: Mood normal.           Assessment & Plan:   Problem List Items Addressed This Visit      Cardiovascular and Mediastinum   Essential hypertension    bp in fair control at this time  BP Readings from Last 1 Encounters:  10/26/19 128/80   No changes needed inst to continue lisinopril 5 mg daily Most recent labs reviewed  Disc lifstyle change with low sodium diet and exercise          Endocrine   Diabetes type 2, controlled ([place]) - Primary    Lab Results  Component Value Date   HGBA1C 6.9 (H) 10/19/2019   Improved from 7.1 with improved diet and some wt loss  Taking statin and ace  Nl foot exam  Sent for eye exam from this summer (per pt nl)  inst to continue metformin 1000 mg bid  Add exercise when able  F/u next in feb       Hyperlipidemia associated with type 2 diabetes mellitus ([place])    Fairly stable  Disc goals for lipids and reasons to control them Rev last labs with pt Rev low sat fat diet in detail Trig 159 with gemifibrozil 600 mg bid  LDL 75 with lovasatin 20 mg daily        Other   Obesity    Discussed how this problem influences overall health and the risks it imposes  Reviewed plan for weight loss with lower calorie diet (via better food choices and also portion control or program  like weight watchers) and exercise building up to or more than 30 minutes 5 days per week including some aerobic activity   Commended wt loss so far  Enc her to continue  Add more exercise if able

## 2019-10-26 NOTE — Assessment & Plan Note (Signed)
Discussed how this problem influences overall health and the risks it imposes  Reviewed plan for weight loss with lower calorie diet (via better food choices and also portion control or program like weight watchers) and exercise building up to or more than 30 minutes 5 days per week including some aerobic activity   Commended wt loss so far  Enc her to continue  Add more exercise if able

## 2019-10-26 NOTE — Assessment & Plan Note (Signed)
Fairly stable  Disc goals for lipids and reasons to control them Rev last labs with pt Rev low sat fat diet in detail Trig 159 with gemifibrozil 600 mg bid  LDL 75 with lovasatin 20 mg daily

## 2019-11-03 ENCOUNTER — Ambulatory Visit (HOSPITAL_COMMUNITY)
Admission: EM | Admit: 2019-11-03 | Discharge: 2019-11-03 | Disposition: A | Discharge status: Home / Self Care | Payer: PPO | Attending: Urgent Care | Admitting: Urgent Care

## 2019-11-03 ENCOUNTER — Ambulatory Visit (INDEPENDENT_AMBULATORY_CARE_PROVIDER_SITE_OTHER): Payer: PPO

## 2019-11-03 ENCOUNTER — Encounter (HOSPITAL_COMMUNITY): Payer: Self-pay

## 2019-11-03 ENCOUNTER — Other Ambulatory Visit: Payer: Self-pay

## 2019-11-03 DIAGNOSIS — S9032XA Contusion of left foot, initial encounter: Secondary | ICD-10-CM

## 2019-11-03 DIAGNOSIS — W19XXXA Unspecified fall, initial encounter: Secondary | ICD-10-CM

## 2019-11-03 DIAGNOSIS — M79675 Pain in left toe(s): Secondary | ICD-10-CM | POA: Diagnosis not present

## 2019-11-03 DIAGNOSIS — M7732 Calcaneal spur, left foot: Secondary | ICD-10-CM | POA: Diagnosis not present

## 2019-11-03 DIAGNOSIS — S99922A Unspecified injury of left foot, initial encounter: Secondary | ICD-10-CM

## 2019-11-03 DIAGNOSIS — M79672 Pain in left foot: Secondary | ICD-10-CM

## 2019-11-03 NOTE — ED Provider Notes (Signed)
[place]   MRN: [id number] DOB: [birth year]  Subjective:   Amy Fry is a 74 y.o. female presenting for 2-week history of persistent left 3rd and 4th toe pain. Patient states that she injured it about 2 weeks ago, does not know exactly how it happened but hit her foot somehow or maybe twisted it against a bucket. Has since had persistent pain. Has used Aspercreme only.  No current facility-administered medications for this encounter.  Current Outpatient Medications:  .  Ascorbic Acid (VITAMIN C) 1000 MG tablet, Take 500 mg by mouth daily. , Disp: , Rfl:  .  aspirin 81 MG EC tablet, Take 81 mg by mouth daily.  , Disp: , Rfl:  .  Calcium Carbonate-Vit D-Min (CALCIUM 1200 PO), Take 1 tablet by mouth 2 (two) times daily., Disp: , Rfl:  .  Cholecalciferol (VITAMIN D) 2000 UNITS CAPS, Take 2,000 Units by mouth daily. , Disp: , Rfl:  .  CRANBERRY PO, Take 4,200 Units by mouth daily., Disp: , Rfl:  .  gemfibrozil (LOPID) 600 MG tablet, TAKE 1 TABLET BY MOUTH TWICE (2) DAILY, Disp: 180 tablet, Rfl: 1 .  ibandronate (BONIVA) 150 MG tablet, , Disp: , Rfl:  .  lisinopril (ZESTRIL) 5 MG tablet, TAKE 1 TABLET BY MOUTH ONCE DAILY, Disp: 90 tablet, Rfl: 1 .  lovastatin (MEVACOR) 20 MG tablet, TAKE 1 TABLET BY MOUTH ONCE DAILY, Disp: 90 tablet, Rfl: 1 .  metFORMIN (GLUCOPHAGE) 1000 MG tablet, Take 1 tablet (1,000 mg total) by mouth 2 (two) times daily with a meal., Disp: 180 tablet, Rfl: 3 .  Omega-3 300 MG CAPS, Take 300 mg by mouth 2 (two) times daily. , Disp: , Rfl:  .  spironolactone (ALDACTONE) 25 MG tablet, TAKE 1/2 TABLET BY MOUTH ONCE DAILY, Disp: 45 tablet, Rfl: 1 .  vitamin B-12 (CYANOCOBALAMIN) 1000 MCG tablet, Take 1,000 mcg by mouth daily., Disp: , Rfl:    Allergies  Allergen Reactions  . Alendronate Sodium     REACTION: reaction not known  . Atorvastatin     REACTION: headaches  . Pravastatin Sodium     REACTION: headaches and arm pain    Past Medical  History:  Diagnosis Date  . Arthritis   . Diabetes mellitus    type II  . Early cataracts, bilateral    no sx yet   . Hyperlipidemia   . Hypertension   . Osteopenia      Past Surgical History:  Procedure Laterality Date  . CYSTOSCOPY  10-1999  . FRACTURE SURGERY  2010   rib fx- from fall   . LAPAROSCOPIC CHOLECYSTECTOMY    . OOPHORECTOMY    . ORIF HUMERUS FRACTURE Left 06/09/2013   Procedure: OPEN REDUCTION INTERNAL FIXATION (ORIF) PROXIMAL HUMERUS FRACTURE;  Surgeon: Rozanna Box, MD;  Location: [facility];  Service: Orthopedics;  Laterality: Left;    Family History  Problem Relation Age of Onset  . Heart attack Mother   . Heart attack Brother   . Stroke Brother   . Depression Sister   . Colon cancer Neg Hx   . Stomach cancer Neg Hx   . Esophageal cancer Neg Hx   . Rectal cancer Neg Hx     Social History   Tobacco Use  . Smoking status: Former Research scientist (life sciences)  . Smokeless tobacco: Never Used  . Tobacco comment: a little as a teenager  Vaping Use  . Vaping Use: Never used  Substance Use Topics  .  Alcohol use: No    Alcohol/week: 0.0 standard drinks  . Drug use: No    ROS   Objective:   Vitals: BP (!) 162/81 (BP Location: Right Arm)   Pulse 75   Temp 97.8 F (36.6 C) (Oral)   Resp 18   SpO2 97%   Physical Exam Constitutional:      General: She is not in acute distress.    Appearance: Normal appearance. She is well-developed. She is not ill-appearing, toxic-appearing or diaphoretic.  HENT:     Head: Normocephalic and atraumatic.     Nose: Nose normal.     Mouth/Throat:     Mouth: Mucous membranes are moist.     Pharynx: Oropharynx is clear.  Eyes:     General: No scleral icterus.       Right eye: No discharge.        Left eye: No discharge.     Extraocular Movements: Extraocular movements intact.     Conjunctiva/sclera: Conjunctivae normal.     Pupils: Pupils are equal, round, and reactive to light.  Cardiovascular:     Rate and Rhythm: Normal rate.    Pulmonary:     Effort: Pulmonary effort is normal.  Musculoskeletal:     Left foot: Decreased range of motion. Normal capillary refill. Tenderness and bony tenderness present. No swelling, deformity, laceration or crepitus.       Feet:  Skin:    General: Skin is warm and dry.  Neurological:     General: No focal deficit present.     Mental Status: She is alert and oriented to person, place, and time.  Psychiatric:        Mood and Affect: Mood normal.        Behavior: Behavior normal.        Thought Content: Thought content normal.        Judgment: Judgment normal.    DG Foot Complete Left  Result Date: 11/03/2019 CLINICAL DATA:  Recent fall with left foot pain, initial encounter EXAM: LEFT FOOT - COMPLETE 3+ VIEW COMPARISON:  None. FINDINGS: Calcaneal spurs are noted. No acute fracture or dislocation is noted. No soft tissue abnormality is seen. IMPRESSION: Chronic changes without acute abnormality. Electronically Signed   By: Inez Catalina M.D.   On: 11/03/2019 17:18    Assessment and Plan :   PDMP not reviewed this encounter.  1. Toe pain, left   2. Foot injury, left, initial encounter   3. Contusion of left foot, initial encounter     Recommended conservative management with postop shoe, Tylenol. X-ray is negative and therefore we will hold off on more aggressive management, referral to an orthopedist. Counseled patient on potential for adverse effects with medications prescribed/recommended today, ER and return-to-clinic precautions discussed, patient verbalized understanding.    Jaynee Eagles, PA-C 11/03/19 1725

## 2019-11-03 NOTE — Discharge Instructions (Signed)
Try to wear the postop shoe to help in your recovery. The x-ray did not show a fracture and so we will manage your symptoms for foot contusion. For pain, please just use Tylenol at a dose of 500mg -650mg  once every 6 hours as needed for your aches, pains, fevers. Do not use any nonsteroidal anti-inflammatories (NSAIDs) like ibuprofen, Motrin, naproxen, Aleve, etc. which are all available over-the-counter.

## 2019-11-03 NOTE — ED Triage Notes (Signed)
Pt presents with pain in the left  third and fourth toe x 2 weeks. States she fell over her left side in the garage 2 weeks ago. Pain is worse when walking.

## 2019-11-12 ENCOUNTER — Other Ambulatory Visit: Payer: Self-pay | Admitting: Family Medicine

## 2019-11-13 NOTE — Telephone Encounter (Signed)
I don't see PCP has filled this med, it's on list as historical, will route to PCP for review

## 2019-11-13 NOTE — Telephone Encounter (Signed)
I think gyn used to px this.  Its fine to change to me if needed.  How long has she been on it?   (I px for 5 year intervals) Thanks

## 2019-11-17 NOTE — Telephone Encounter (Signed)
lft VM to rtn call to get information.  Dm/cma

## 2019-11-17 NOTE — Telephone Encounter (Signed)
Pt returned call [facility] [phone]

## 2019-11-18 NOTE — Telephone Encounter (Signed)
Pt returned call [facility] [phone]

## 2019-11-19 DIAGNOSIS — Z6831 Body mass index (BMI) 31.0-31.9, adult: Secondary | ICD-10-CM | POA: Diagnosis not present

## 2019-11-19 DIAGNOSIS — M816 Localized osteoporosis [Lequesne]: Secondary | ICD-10-CM | POA: Diagnosis not present

## 2019-11-19 DIAGNOSIS — Z1231 Encounter for screening mammogram for malignant neoplasm of breast: Secondary | ICD-10-CM | POA: Diagnosis not present

## 2019-11-19 DIAGNOSIS — Z01419 Encounter for gynecological examination (general) (routine) without abnormal findings: Secondary | ICD-10-CM | POA: Diagnosis not present

## 2019-11-19 DIAGNOSIS — N958 Other specified menopausal and perimenopausal disorders: Secondary | ICD-10-CM | POA: Diagnosis not present

## 2019-11-19 NOTE — Telephone Encounter (Signed)
(  per DPR) left VM letting pt know Dr. Marliss Coots comments and requested pt to call the office back

## 2020-02-09 ENCOUNTER — Other Ambulatory Visit: Payer: Self-pay | Admitting: Family Medicine

## 2020-02-10 ENCOUNTER — Telehealth: Payer: Self-pay | Admitting: Family Medicine

## 2020-02-10 ENCOUNTER — Other Ambulatory Visit (INDEPENDENT_AMBULATORY_CARE_PROVIDER_SITE_OTHER): Payer: PPO

## 2020-02-10 ENCOUNTER — Other Ambulatory Visit: Payer: Self-pay

## 2020-02-10 DIAGNOSIS — I10 Essential (primary) hypertension: Secondary | ICD-10-CM | POA: Diagnosis not present

## 2020-02-10 DIAGNOSIS — E1169 Type 2 diabetes mellitus with other specified complication: Secondary | ICD-10-CM

## 2020-02-10 DIAGNOSIS — E119 Type 2 diabetes mellitus without complications: Secondary | ICD-10-CM

## 2020-02-10 DIAGNOSIS — E559 Vitamin D deficiency, unspecified: Secondary | ICD-10-CM | POA: Diagnosis not present

## 2020-02-10 DIAGNOSIS — E785 Hyperlipidemia, unspecified: Secondary | ICD-10-CM

## 2020-02-10 DIAGNOSIS — M810 Age-related osteoporosis without current pathological fracture: Secondary | ICD-10-CM

## 2020-02-10 LAB — COMPREHENSIVE METABOLIC PANEL
ALT: 13 U/L (ref 0–35)
AST: 16 U/L (ref 0–37)
Albumin: 4.4 g/dL (ref 3.5–5.2)
Alkaline Phosphatase: 56 U/L (ref 39–117)
BUN: 18 mg/dL (ref 6–23)
CO2: 29 mEq/L (ref 19–32)
Calcium: 10.3 mg/dL (ref 8.4–10.5)
Chloride: 104 mEq/L (ref 96–112)
Creatinine, Ser: 0.87 mg/dL (ref 0.40–1.20)
GFR: 66.15 mL/min (ref >60.00)
Glucose, Bld: 122 mg/dL — ABNORMAL HIGH (ref 70–99)
Potassium: 4.4 mEq/L (ref 3.5–5.1)
Sodium: 140 mEq/L (ref 135–145)
Total Bilirubin: 0.6 mg/dL (ref 0.2–1.2)
Total Protein: 6.8 g/dL (ref 6.0–8.3)

## 2020-02-10 LAB — CBC WITH DIFFERENTIAL/PLATELET
Basophils Absolute: 0 10*3/uL (ref 0.0–0.1)
Basophils Relative: 0.9 % (ref 0.0–3.0)
Eosinophils Absolute: 0.1 10*3/uL (ref 0.0–0.7)
Eosinophils Relative: 3 % (ref 0.0–5.0)
HCT: 39.6 % (ref 36.0–46.0)
Hemoglobin: 13.6 g/dL (ref 12.0–15.0)
Lymphocytes Relative: 45.3 % (ref 12.0–46.0)
Lymphs Abs: 1.7 10*3/uL (ref 0.7–4.0)
MCHC: 34.2 g/dL (ref 30.0–36.0)
MCV: 91.9 fl (ref 78.0–100.0)
Monocytes Absolute: 0.4 10*3/uL (ref 0.1–1.0)
Monocytes Relative: 10.6 % (ref 3.0–12.0)
Neutro Abs: 1.5 10*3/uL (ref 1.4–7.7)
Neutrophils Relative %: 40.2 % — ABNORMAL LOW (ref 43.0–77.0)
Platelets: 309 10*3/uL (ref 150.0–400.0)
RBC: 4.31 Mil/uL (ref 3.87–5.11)
RDW: 13.8 % (ref 11.5–15.5)
WBC: 3.8 10*3/uL — ABNORMAL LOW (ref 4.0–10.5)

## 2020-02-10 LAB — VITAMIN D 25 HYDROXY (VIT D DEFICIENCY, FRACTURES): VITD: 61.66 ng/mL (ref 30.00–100.00)

## 2020-02-10 LAB — HEMOGLOBIN A1C: Hgb A1c MFr Bld: 6.3 % (ref 4.6–6.5)

## 2020-02-10 LAB — LIPID PANEL
Cholesterol: 152 mg/dL (ref 0–200)
HDL: 52.4 mg/dL (ref >39.00)
LDL Cholesterol: 79 mg/dL (ref 0–99)
NonHDL: 99.22
Total CHOL/HDL Ratio: 3
Triglycerides: 103 mg/dL (ref 0.0–149.0)
VLDL: 20.6 mg/dL (ref 0.0–40.0)

## 2020-02-10 LAB — TSH: TSH: 1.77 u[IU]/mL (ref 0.35–4.50)

## 2020-02-10 NOTE — Telephone Encounter (Signed)
Pharmacy requests refill on: Gemfibrozil 600 mg   LAST REFILL: 05/26/2019 (Q-180, R-1) LAST OV: 10/26/2019 NEXT OV: 02/17/2020 PHARMACY: [facility] requests refill on: Metformin 1000 mg    LAST REFILL: 02/11/2019 (Q-180, R-3) LAST OV: 10/26/2019 NEXT OV: 02/17/2020 PHARMACY: [facility]  Hgb A1C (10/19/2019): 6.9  Pharmacy requests refill on: Lovastatin 20 mg   LAST REFILL: 05/26/2019 (Q-90, R-1) LAST OV: 10/26/2019 NEXT OV: 02/17/2020 PHARMACY: [facility] requests refill on: Spironolactone 25 mg   LAST REFILL: 05/26/2019 (Q-45, R-1) LAST OV: 10/26/2019 NEXT OV: 02/17/2020 PHARMACY: [facility] requests refill on: Lisinopril 5 mg   LAST REFILL: 05/26/2019 (Q-90, R-1) LAST OV: 10/26/2019 NEXT OV: 02/17/2020 PHARMACY: [facility]

## 2020-02-10 NOTE — Telephone Encounter (Signed)
-----   Message from Cloyd Stagers, RT sent at 01/25/2020  3:23 PM EST ----- Regarding: Lab Orders for Wednesday 1.26.2022 Please place lab orders for Wednesday 1.26.2022, office visit for physical on Wednesday 2.2.2022 Thank you, Dyke Maes RT(R)

## 2020-02-17 ENCOUNTER — Other Ambulatory Visit: Payer: Self-pay

## 2020-02-17 ENCOUNTER — Ambulatory Visit (INDEPENDENT_AMBULATORY_CARE_PROVIDER_SITE_OTHER): Payer: PPO | Admitting: Family Medicine

## 2020-02-17 ENCOUNTER — Encounter: Payer: Self-pay | Admitting: Family Medicine

## 2020-02-17 VITALS — BP 132/78 | HR 63 | Temp 96.9°F | Ht 62.75 in | Wt 171.5 lb

## 2020-02-17 DIAGNOSIS — E6609 Other obesity due to excess calories: Secondary | ICD-10-CM

## 2020-02-17 DIAGNOSIS — E119 Type 2 diabetes mellitus without complications: Secondary | ICD-10-CM | POA: Diagnosis not present

## 2020-02-17 DIAGNOSIS — E559 Vitamin D deficiency, unspecified: Secondary | ICD-10-CM

## 2020-02-17 DIAGNOSIS — Z0000 Encounter for general adult medical examination without abnormal findings: Secondary | ICD-10-CM

## 2020-02-17 DIAGNOSIS — Z6830 Body mass index (BMI) 30.0-30.9, adult: Secondary | ICD-10-CM

## 2020-02-17 DIAGNOSIS — I10 Essential (primary) hypertension: Secondary | ICD-10-CM

## 2020-02-17 DIAGNOSIS — M810 Age-related osteoporosis without current pathological fracture: Secondary | ICD-10-CM | POA: Diagnosis not present

## 2020-02-17 DIAGNOSIS — E785 Hyperlipidemia, unspecified: Secondary | ICD-10-CM | POA: Diagnosis not present

## 2020-02-17 DIAGNOSIS — E1169 Type 2 diabetes mellitus with other specified complication: Secondary | ICD-10-CM

## 2020-02-17 NOTE — Assessment & Plan Note (Signed)
Followed by gyn ([facility] )-per pt dexa done in the fall improved  Continues boniva  No falls or recent fractures Continues ca and D (nl D level) Encouraged more exercise

## 2020-02-17 NOTE — Assessment & Plan Note (Signed)
Reviewed health habits including diet and exercise and skin cancer prevention Reviewed appropriate screening tests for age  Also reviewed health mt list, fam hx and immunization status , as well as social and family history   See HPI Labs reviewed  Sent for mammogram and dexa reports from gyn  Declines flu and shigrix vaccines  Given materials to work on an advance directive  No cognitive concerns  Fairly good hearing screen Vision/eye care is utd  

## 2020-02-17 NOTE — Assessment & Plan Note (Signed)
Reviewed health habits including diet and exercise and skin cancer prevention Reviewed appropriate screening tests for age  Also reviewed health mt list, fam hx and immunization status , as well as social and family history   See HPI Labs reviewed  Sent for mammogram and dexa reports from gyn  Declines flu and shigrix vaccines  Given materials to work on an advance directive  No cognitive concerns  Fairly good hearing screen Vision/eye care is utd

## 2020-02-17 NOTE — Assessment & Plan Note (Signed)
bp in fair control at this time  BP Readings from Last 1 Encounters:  02/17/20 132/78   No changes needed Most recent labs reviewed  Disc lifstyle change with low sodium diet and exercise  Plan to continue lisinopril 5 mg daily and spironolactone 25 mg daily

## 2020-02-17 NOTE — Progress Notes (Signed)
Subjective:    Patient ID: Amy Fry, female    DOB: Nov 02, 1945, 75 y.o.   MRN: [id number]  This visit occurred during the SARS-CoV-2 public health emergency.  Safety protocols were in place, including screening questions prior to the visit, additional usage of staff PPE, and extensive cleaning of exam room while observing appropriate contact time as indicated for disinfecting solutions.    HPI Pt presents for amw and health mt exam  I have personally reviewed the Medicare Annual Wellness questionnaire and have noted 1. The patient's medical and social history 2. Their use of alcohol, tobacco or illicit drugs 3. Their current medications and supplements 4. The patient's functional ability including ADL's, fall risks, home safety risks and hearing or visual             impairment. 5. Diet and physical activities 6. Evidence for depression or mood disorders  The patients weight, height, BMI have been recorded in the chart and visual acuity is per eye clinic.  I have made referrals, counseling and provided education to the patient based review of the above and I have provided the pt with a written personalized care plan for preventive services. Reviewed and updated provider list, see scanned forms.  See scanned forms.  Routine anticipatory guidance given to patient.  See health maintenance. Colon cancer screening  Colonoscopy 6/19  Breast cancer screening mammogram -had last year at gyn (? Oct) Self breast exam -no lumps  Flu vaccine -declines  Tetanus vaccine 5/13 Tdap Pneumovax completed Zoster vaccine- not interested in vaccine covid vaccines -utd with booster Dexa -last fall with [facility] (? Improved)  Taking boniva (she thinks it may worsen aches)  Falls-none  Fractures (remote humerus fx) Supplements ca and D D level is 61.6 Exercise -not much   Advance directive- given materials to work on /wants husb to be [employer] function addressed- see  scanned forms- and if abnormal then additional documentation follows.   Once in a while she misplaces something  That is it  No confusion  Does not get lost  Does her finances /check book   PMH and SH reviewed  Meds, vitals, and allergies reviewed.   ROS: See HPI.  Otherwise negative.    Weight : Wt Readings from Last 3 Encounters:  02/17/20 171 lb 8 oz (77.8 kg)  10/26/19 186 lb (84.4 kg)  05/13/19 188 lb (85.3 kg)   30.62 kg/m Trying hard to loose weight  She does not eat breakfast - ? Fasting  Eating better also  Not exercising -but does have to put wood in the stove   Hearing/vision:  Hearing Screening   125Hz  250Hz  500Hz  1000Hz  2000Hz  3000Hz  4000Hz  6000Hz  8000Hz   Right ear:   40 40 40  40    Left ear:   40 40 40  0    Vision Screening Comments: Eye exam done in July 2021 with Dr. Valetta Close    Care team [facility]-pcp [facility]- ophth [facility]-gyn [facility]- GI  HTN bp is stable today  No cp or palpitations or headaches or edema  No side effects to medicines  BP Readings from Last 3 Encounters:  02/17/20 132/78  11/03/19 (!) 162/81  10/26/19 128/80     Taking lisinopril 5 mg daily  Spironolactone 25 mg   Pulse Readings from Last 3 Encounters:  02/17/20 63  11/03/19 75  10/26/19 72     DM2 Lab Results  Component Value Date   HGBA1C 6.3 02/10/2020  Taking statin and ace Improved from 6.9  Wt loss noted Eye exam utd Taking metformin 1000 mg bid   Hyperlipidemia Lab Results  Component Value Date   CHOL 152 02/10/2020   CHOL 155 10/19/2019   CHOL 163 02/05/2019   Lab Results  Component Value Date   HDL 52.40 02/10/2020   HDL 48.60 10/19/2019   HDL 52.60 02/05/2019   Lab Results  Component Value Date   LDLCALC 79 02/10/2020   LDLCALC 75 10/19/2019   LDLCALC 85 02/05/2019   Lab Results  Component Value Date   TRIG 103.0 02/10/2020   TRIG 159.0 (H) 10/19/2019   TRIG 129.0 02/05/2019   Lab Results  Component Value Date   CHOLHDL 3  02/10/2020   CHOLHDL 3 10/19/2019   CHOLHDL 3 02/05/2019   No results found for: LDLDIRECT Lovastatin and diet  Also lopid  Eating better   Other labs  Lab Results  Component Value Date   WBC 3.8 (L) 02/10/2020   HGB 13.6 02/10/2020   HCT 39.6 02/10/2020   MCV 91.9 02/10/2020   PLT 309.0 02/10/2020   Lab Results  Component Value Date   TSH 1.77 02/10/2020    Lab Results  Component Value Date   CREATININE 0.87 02/10/2020   BUN 18 02/10/2020   NA 140 02/10/2020   K 4.4 02/10/2020   CL 104 02/10/2020   CO2 29 02/10/2020   Lab Results  Component Value Date   ALT 13 02/10/2020   AST 16 02/10/2020   ALKPHOS 56 02/10/2020   BILITOT 0.6 02/10/2020    Patient Active Problem List   Diagnosis Date Noted  . Need for hepatitis C screening test 12/13/2015  . Obesity 12/11/2014  . Herpes simplex 03/16/2014  . Pedal edema 06/15/2013  . History of fracture of arm 06/09/2013  . Encounter for Medicare annual wellness exam 06/17/2012  . Routine general medical examination at a health care facility 02/18/2011  . Vitamin D deficiency 07/30/2008  . DERMATOPHYTOSIS, NAIL 07/26/2006  . Diabetes type 2, controlled ([place]) 07/25/2006  . Hyperlipidemia associated with type 2 diabetes mellitus ([place]) 07/25/2006  . Essential hypertension 07/25/2006  . Osteoporosis 07/25/2006   Past Medical History:  Diagnosis Date  . Arthritis   . Diabetes mellitus    type II  . Early cataracts, bilateral    no sx yet   . Hyperlipidemia   . Hypertension   . Osteopenia    Past Surgical History:  Procedure Laterality Date  . CYSTOSCOPY  10-1999  . FRACTURE SURGERY  2010   rib fx- from fall   . LAPAROSCOPIC CHOLECYSTECTOMY    . OOPHORECTOMY    . ORIF HUMERUS FRACTURE Left 06/09/2013   Procedure: OPEN REDUCTION INTERNAL FIXATION (ORIF) PROXIMAL HUMERUS FRACTURE;  Surgeon: Rozanna Box, MD;  Location: [facility];  Service: Orthopedics;  Laterality: Left;   Social History   Tobacco Use  . Smoking  status: Former Research scientist (life sciences)  . Smokeless tobacco: Never Used  . Tobacco comment: a little as a teenager  Vaping Use  . Vaping Use: Never used  Substance Use Topics  . Alcohol use: No    Alcohol/week: 0.0 standard drinks  . Drug use: No   Family History  Problem Relation Age of Onset  . Heart attack Mother   . Heart attack Brother   . Stroke Brother   . Depression Sister   . Colon cancer Neg Hx   . Stomach cancer Neg Hx   . Esophageal cancer  Neg Hx   . Rectal cancer Neg Hx    Allergies  Allergen Reactions  . Alendronate Sodium     REACTION: reaction not known  . Atorvastatin     REACTION: headaches  . Pravastatin Sodium     REACTION: headaches and arm pain   Current Outpatient Medications on File Prior to Visit  Medication Sig Dispense Refill  . Ascorbic Acid (VITAMIN C) 1000 MG tablet Take 500 mg by mouth daily.    Marland Kitchen aspirin 81 MG EC tablet Take 81 mg by mouth daily.    . Calcium Carbonate-Vit D-Min (CALCIUM 1200 PO) Take 1 tablet by mouth 2 (two) times daily.    . Cholecalciferol (VITAMIN D) 2000 UNITS CAPS Take 2,000 Units by mouth daily.     Marland Kitchen CRANBERRY PO Take 4,200 Units by mouth daily.    Marland Kitchen gemfibrozil (LOPID) 600 MG tablet TAKE 1 TABLET BY MOUTH TWICE (2) DAILY 180 tablet 1  . ibandronate (BONIVA) 150 MG tablet     . lisinopril (ZESTRIL) 5 MG tablet TAKE 1 TABLET BY MOUTH ONCE DAILY 90 tablet 1  . lovastatin (MEVACOR) 20 MG tablet TAKE 1 TABLET BY MOUTH ONCE DAILY 90 tablet 1  . metFORMIN (GLUCOPHAGE) 1000 MG tablet TAKE 1 TABLET BY MOUTH TWICE (2) DAILY WITH A MEAL 180 tablet 1  . Omega-3 300 MG CAPS Take 300 mg by mouth 2 (two) times daily.     Marland Kitchen spironolactone (ALDACTONE) 25 MG tablet TAKE 1/2 TABLET BY MOUTH ONCE DAILY 45 tablet 1  . vitamin B-12 (CYANOCOBALAMIN) 1000 MCG tablet Take 1,000 mcg by mouth daily.     No current facility-administered medications on file prior to visit.    Review of Systems  Constitutional: Negative for activity change, appetite  change, fatigue, fever and unexpected weight change.  HENT: Negative for congestion, ear pain, rhinorrhea, sinus pressure and sore throat.   Eyes: Negative for pain, redness and visual disturbance.  Respiratory: Negative for cough, shortness of breath and wheezing.   Cardiovascular: Negative for chest pain and palpitations.  Gastrointestinal: Negative for abdominal pain, blood in stool, constipation and diarrhea.  Endocrine: Negative for polydipsia and polyuria.  Genitourinary: Negative for dysuria, frequency and urgency.  Musculoskeletal: Negative for arthralgias, back pain and myalgias.  Skin: Negative for pallor and rash.  Allergic/Immunologic: Negative for environmental allergies.  Neurological: Negative for dizziness, syncope and headaches.  Hematological: Negative for adenopathy. Does not bruise/bleed easily.  Psychiatric/Behavioral: Negative for decreased concentration and dysphoric mood. The patient is not nervous/anxious.        Objective:   Physical Exam Constitutional:      General: She is not in acute distress.    Appearance: Normal appearance. She is well-developed. She is obese. She is not ill-appearing or diaphoretic.  HENT:     Head: Normocephalic and atraumatic.     Right Ear: Tympanic membrane, ear canal and external ear normal.     Left Ear: Tympanic membrane, ear canal and external ear normal.     Nose: Nose normal. No congestion.     Mouth/Throat:     Mouth: Mucous membranes are moist.     Pharynx: Oropharynx is clear. No posterior oropharyngeal erythema.  Eyes:     General: No scleral icterus.    Extraocular Movements: Extraocular movements intact.     Conjunctiva/sclera: Conjunctivae normal.     Pupils: Pupils are equal, round, and reactive to light.  Neck:     Thyroid: No thyromegaly.  Vascular: No carotid bruit or JVD.  Cardiovascular:     Rate and Rhythm: Normal rate and regular rhythm.     Pulses: Normal pulses.     Heart sounds: Normal heart  sounds. No gallop.   Pulmonary:     Effort: Pulmonary effort is normal. No respiratory distress.     Breath sounds: Normal breath sounds. No wheezing.     Comments: Good air exch Chest:     Chest wall: No tenderness.  Abdominal:     General: Bowel sounds are normal. There is no distension or abdominal bruit.     Palpations: Abdomen is soft. There is no mass.     Tenderness: There is no abdominal tenderness.     Hernia: No hernia is present.  Genitourinary:    Comments: Breast exam: No mass, nodules, thickening, tenderness, bulging, retraction, inflamation, nipple discharge or skin changes noted.  No axillary or clavicular LA.     Musculoskeletal:        General: No tenderness. Normal range of motion.     Cervical back: Normal range of motion and neck supple. No rigidity. No muscular tenderness.     Right lower leg: No edema.     Left lower leg: No edema.  Lymphadenopathy:     Cervical: No cervical adenopathy.  Skin:    General: Skin is warm and dry.     Coloration: Skin is not pale.     Findings: No erythema or rash.     Comments: Solar lentigines diffusely   Neurological:     Mental Status: She is alert. Mental status is at baseline.     Cranial Nerves: No cranial nerve deficit.     Motor: No abnormal muscle tone.     Coordination: Coordination normal.     Gait: Gait normal.     Deep Tendon Reflexes: Reflexes are normal and symmetric. Reflexes normal.  Psychiatric:        Mood and Affect: Mood normal.        Cognition and Memory: Cognition and memory normal.           Assessment & Plan:   Problem List Items Addressed This Visit      Cardiovascular and Mediastinum   Essential hypertension    bp in fair control at this time  BP Readings from Last 1 Encounters:  02/17/20 132/78   No changes needed Most recent labs reviewed  Disc lifstyle change with low sodium diet and exercise  Plan to continue lisinopril 5 mg daily and spironolactone 25 mg daily           Endocrine   Diabetes type 2, controlled ([place])    Improved with wt loss and better dietary habits Lab Results  Component Value Date   HGBA1C 6.3 02/10/2020   Down from 6.9  Commended  Eye exam utd Good foot care Plan to continue metformin 1000 mg bid  F/u 6 mo      Hyperlipidemia associated with type 2 diabetes mellitus ([place])    Disc goals for lipids and reasons to control them Rev last labs with pt Rev low sat fat diet in detail Good control with lovastatin 20 mg daily and gemfibrozil 600 mg bid          Musculoskeletal and Integument   Osteoporosis    Followed by gyn ([facility] )-per pt dexa done in the fall improved  Continues boniva  No falls or recent fractures Continues ca and  D (nl D level) Encouraged more exercise         Other   Vitamin D deficiency    Good level at 61.6 Vitamin D level is therapeutic with current supplementation Disc importance of this to bone and overall health       Routine general medical examination at a health care facility    Reviewed health habits including diet and exercise and skin cancer prevention Reviewed appropriate screening tests for age  Also reviewed health mt list, fam hx and immunization status , as well as social and family history   See HPI Labs reviewed  Sent for mammogram and dexa reports from gyn  Declines flu and shigrix vaccines  Given materials to work on an advance directive  No cognitive concerns  Fairly good hearing screen Vision/eye care is utd       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    Reviewed health habits including diet and exercise and skin cancer prevention Reviewed appropriate screening tests for age  Also reviewed health mt list, fam hx and immunization status , as well as social and family history   See HPI Labs reviewed  Sent for mammogram and dexa reports from gyn  Declines flu and shigrix vaccines  Given materials to work on an  advance directive  No cognitive concerns  Fairly good hearing screen Vision/eye care is utd       Obesity    Discussed how this problem influences overall health and the risks it imposes  Reviewed plan for weight loss with lower calorie diet (via better food choices and also portion control or program like weight watchers) and exercise building up to or more than 30 minutes 5 days per week including some aerobic activity   Commended on wt loss so far with better diet  Discussed options for exercise/may try a pedaling device

## 2020-02-17 NOTE — Patient Instructions (Addendum)
Try adding some exercise -work up to 30 minutes per day  An exercise bike or pedaler is a great idea  Get out and walk a little bit when weather is better   Keep up the good work with weight loss   Work on the advance directive -blue booklet - then get it notarized   Take care of yourself !   Follow up in 6 months

## 2020-02-17 NOTE — Assessment & Plan Note (Signed)
Disc goals for lipids and reasons to control them Rev last labs with pt Rev low sat fat diet in detail Good control with lovastatin 20 mg daily and gemfibrozil 600 mg bid

## 2020-02-17 NOTE — Assessment & Plan Note (Signed)
Good level at 61.6 Vitamin D level is therapeutic with current supplementation Disc importance of this to bone and overall health

## 2020-02-17 NOTE — Assessment & Plan Note (Signed)
Improved with wt loss and better dietary habits Lab Results  Component Value Date   HGBA1C 6.3 02/10/2020   Down from 6.9  Commended  Eye exam utd Good foot care Plan to continue metformin 1000 mg bid  F/u 6 mo

## 2020-02-17 NOTE — Assessment & Plan Note (Signed)
Discussed how this problem influences overall health and the risks it imposes  Reviewed plan for weight loss with lower calorie diet (via better food choices and also portion control or program like weight watchers) and exercise building up to or more than 30 minutes 5 days per week including some aerobic activity   Commended on wt loss so far with better diet  Discussed options for exercise/may try a pedaling device

## 2020-03-17 ENCOUNTER — Telehealth: Payer: Self-pay | Admitting: Family Medicine

## 2020-03-17 NOTE — Chronic Care Management (AMB) (Signed)
  Chronic Care Management   Note  03/17/2020 Name: [name] MRN: [id number] DOB: 30-Aug-1945  Amy Fry is a 75 y.o. year old female who is a primary care patient of Tower, Wynelle Fanny, MD. I reached out to [employer] by phone today in response to a referral sent by Amy Fry's PCP, Tower, Wynelle Fanny, MD.   Amy Fry was given information about Chronic Care Management services today including:  1. CCM service includes personalized support from designated clinical staff supervised by her physician, including individualized plan of care and coordination with other care providers 2. 24/7 contact phone numbers for assistance for urgent and routine care needs. 3. Service will only be billed when office clinical staff spend 20 minutes or more in a month to coordinate care. 4. Only one practitioner may furnish and bill the service in a calendar month. 5. The patient may stop CCM services at any time (effective at the end of the month) by phone call to the office staff.   Patient agreed to services and verbal consent obtained.   Follow up plan:   [place]

## 2020-03-29 ENCOUNTER — Telehealth: Payer: Self-pay | Admitting: Family Medicine

## 2020-03-29 NOTE — Progress Notes (Signed)
  Chronic Care Management   Outreach Note  03/29/2020 Name: Amy Fry MRN: [id number] DOB: 11/07/1945  Referred by: Tower, Wynelle Fanny, MD Reason for referral : No chief complaint on file.   An unsuccessful telephone outreach was attempted today. The patient was referred to the pharmacist for assistance with care management and care coordination.   Follow Up Plan:   [place]uretta Grill [facility] Scheduler

## 2020-04-20 ENCOUNTER — Telehealth: Payer: Self-pay

## 2020-04-20 NOTE — Chronic Care Management (AMB) (Addendum)
    Chronic Care Management Pharmacy Assistant   Name: Amy Fry  MRN: [id number] DOB: 03/12/1945  Reason for Encounter: Initial Questions for CCM appointment 04/28/20   Conditions to be addressed/monitored: HTN, HLD and DMII  Recent office visits:  02/17/20- Dr. Loura Pardon, PCP-  11/03/19- Urgent Care- Toe injury 10/26/19- Dr. Glori Bickers  Recent consult visits:  [facility]19/19[facility]  None in previous 6 months  Medications: Outpatient Encounter Medications as of 04/20/2020  Medication Sig   Ascorbic Acid (VITAMIN C) 1000 MG tablet Take 500 mg by mouth daily.   aspirin 81 MG EC tablet Take 81 mg by mouth daily.   Calcium Carbonate-Vit D-Min (CALCIUM 1200 PO) Take 1 tablet by mouth 2 (two) times daily.   Cholecalciferol (VITAMIN D) 2000 UNITS CAPS Take 2,000 Units by mouth daily.    CRANBERRY PO Take 4,200 Units by mouth daily.   gemfibrozil (LOPID) 600 MG tablet TAKE 1 TABLET BY MOUTH TWICE (2) DAILY   ibandronate (BONIVA) 150 MG tablet    lisinopril (ZESTRIL) 5 MG tablet TAKE 1 TABLET BY MOUTH ONCE DAILY   lovastatin (MEVACOR) 20 MG tablet TAKE 1 TABLET BY MOUTH ONCE DAILY   metFORMIN (GLUCOPHAGE) 1000 MG tablet TAKE 1 TABLET BY MOUTH TWICE (2) DAILY WITH A MEAL   Omega-3 300 MG CAPS Take 300 mg by mouth 2 (two) times daily.    spironolactone (ALDACTONE) 25 MG tablet TAKE 1/2 TABLET BY MOUTH ONCE DAILY   vitamin B-12 (CYANOCOBALAMIN) 1000 MCG tablet Take 1,000 mcg by mouth daily.   No facility-administered encounter medications on file as of 04/20/2020.    Lab Results  Component Value Date/Time   HGBA1C 6.3 02/10/2020 08:19 AM   HGBA1C 6.9 (H) 10/19/2019 08:00 AM   MICROALBUR 0.2 07/30/2008 10:45 AM     BP Readings from Last 3 Encounters:  02/17/20 132/78  11/03/19 (!) 162/81  10/26/19 128/80    Have you seen any other providers since your last visit with PCP? No  Patient states she would not like to enroll in CCM services. She  states she talks with [facility] regularly and feels like it would be redundant. Michelle informed appointment on 04/28/20 at 2:00 to be cancelled.   Star Rating Drugs: Medication:  Last Fill: Day Supply Lisinopril 5 mg  02/10/20 90 Metformin 1000 mg 02/06/20 90 Lovastatin 20 mg 02/10/20 90  Follow-Up:  Pharmacist Review  Debbora Dus, CPP notified  Margaretmary Dys, [place] Assistant [phone]  I have reviewed the care management and care coordination activities outlined in this encounter and I am certifying that I agree with the content of this note. No further action required.  Debbora Dus, PharmD Clinical Pharmacist [facility] at [facility] [phone]

## 2020-04-28 ENCOUNTER — Telehealth: Payer: PPO

## 2020-04-28 NOTE — Progress Notes (Deleted)
Chronic Care Management Pharmacy Note  04/28/2020 Name:  Amy Fry MRN:  [id number] DOB:  1945-02-02  Subjective: Amy Fry is an 75 y.o. year old female who is a primary patient of Tower, Wynelle Fanny, MD.  The CCM team was consulted for assistance with disease management and care coordination needs.    Engaged with patient by telephone for initial visit in response to provider referral for pharmacy case management and/or care coordination services.   CCM Consent 03/17/20  Consent to Services:  The patient was given the following information about Chronic Care Management services today, agreed to services, and gave verbal consent: 1. CCM service includes personalized support from designated clinical staff supervised by the primary care provider, including individualized plan of care and coordination with other care providers 2. 24/7 contact phone numbers for assistance for urgent and routine care needs. 3. Service will only be billed when office clinical staff spend 20 minutes or more in a month to coordinate care. 4. Only one practitioner may furnish and bill the service in a calendar month. 5.The patient may stop CCM services at any time (effective at the end of the month) by phone call to the office staff. 6. The patient will be responsible for cost sharing (co-pay) of up to 20% of the service fee (after annual deductible is met). Patient agreed to services and consent obtained.  Patient Care Team: Tower, Wynelle Fanny, MD as PCP - Faylene Kurtz, MD as Consulting Physician (Ophthalmology) Ralene Bathe, MD as Consulting Physician (Dermatology) Arvella Nigh, MD as Consulting Physician (Obstetrics and Gynecology) Debbora Dus, [facility] as Pharmacist (Pharmacist)  Recent office visits:  02/17/20- Dr. Loura Pardon, PCP 11/03/19- Urgent Care- Toe injury 10/26/19- Dr. Glori Bickers  Recent consult visits:  [facility]  None in previous 6  months  Objective:  Lab Results  Component Value Date   CREATININE 0.87 02/10/2020   BUN 18 02/10/2020   GFR 66.15 02/10/2020   GFRNONAA >90 06/11/2013   GFRAA >90 06/11/2013   NA 140 02/10/2020   K 4.4 02/10/2020   CALCIUM 10.3 02/10/2020   CO2 29 02/10/2020   GLUCOSE 122 (H) 02/10/2020    Lab Results  Component Value Date/Time   HGBA1C 6.3 02/10/2020 08:19 AM   HGBA1C 6.9 (H) 10/19/2019 08:00 AM   GFR 66.15 02/10/2020 08:19 AM   GFR 61.37 10/19/2019 08:00 AM   MICROALBUR 0.2 07/30/2008 10:45 AM    Last diabetic Eye exam:  Lab Results  Component Value Date/Time   HMDIABEYEEXA No Retinopathy 08/28/2018 12:00 AM    Last diabetic Foot exam:  Lab Results  Component Value Date/Time   HMDIABFOOTEX yes  02/08/2010 12:00 AM     Lab Results  Component Value Date   CHOL 152 02/10/2020   HDL 52.40 02/10/2020   LDLCALC 79 02/10/2020   TRIG 103.0 02/10/2020   CHOLHDL 3 02/10/2020    Hepatic Function Latest Ref Rng & Units 02/10/2020 10/19/2019 02/05/2019  Total Protein 6.0 - 8.3 g/dL 6.8 6.8 7.0  Albumin 3.5 - 5.2 g/dL 4.4 4.3 4.4  AST 0 - 37 U/L '16 18 18  ' ALT 0 - 35 U/L '13 17 17  ' Alk Phosphatase 39 - 117 U/L 56 48 62  Total Bilirubin 0.2 - 1.2 mg/dL 0.6 0.3 0.5  Bilirubin, Direct 0.0 - 0.3 mg/dL - - -    Lab Results  Component Value Date/Time   TSH 1.77 02/10/2020 08:19 AM  TSH 1.28 02/05/2019 08:28 AM    CBC Latest Ref Rng & Units 02/10/2020 02/05/2019 08/01/2018  WBC 4.0 - 10.5 K/uL 3.8(L) 4.5 4.3  Hemoglobin 12.0 - 15.0 g/dL 13.6 14.1 13.5  Hematocrit 36.0 - 46.0 % 39.6 42.4 39.8  Platelets 150.0 - 400.0 K/uL 309.0 270.0 265.0    Lab Results  Component Value Date/Time   VD25OH 61.66 02/10/2020 08:19 AM   VD25OH 55.64 02/05/2019 08:28 AM    Clinical ASCVD: No  The 10-year ASCVD risk score Mikey Bussing DC Jr., et al., 2013) is: 31%   Values used to calculate the score:     Age: 28 years     Sex: Female     Is Non-Hispanic African American: No     Diabetic:  Yes     Tobacco smoker: No     Systolic Blood Pressure: 158 mmHg     Is BP treated: Yes     HDL Cholesterol: 52.4 mg/dL     Total Cholesterol: 152 mg/dL    Depression screen [facility] 2/9 02/17/2020 02/11/2019 02/05/2019  Decreased Interest 0 0 0  Down, Depressed, Hopeless 0 0 0  PHQ - 2 Score 0 0 0  Altered sleeping - - 0  Tired, decreased energy - - 0  Change in appetite - - 0  Feeling bad or failure about yourself  - - 0  Trouble concentrating - - 0  Moving slowly or fidgety/restless - - 0  Suicidal thoughts - - 0  PHQ-9 Score - - 0  Difficult doing work/chores - - Not difficult at all     ***Other: (CHADS2VASc if Afib, MMRC or CAT for COPD, ACT, DEXA)  Social History   Tobacco Use  Smoking Status Former Smoker  Smokeless Tobacco Never Used  Tobacco Comment   a little as a teenager   BP Readings from Last 3 Encounters:  02/17/20 132/78  11/03/19 (!) 162/81  10/26/19 128/80   Pulse Readings from Last 3 Encounters:  02/17/20 63  11/03/19 75  10/26/19 72   Wt Readings from Last 3 Encounters:  02/17/20 171 lb 8 oz (77.8 kg)  10/26/19 186 lb (84.4 kg)  05/13/19 188 lb (85.3 kg)   BMI Readings from Last 3 Encounters:  02/17/20 30.62 kg/m  10/26/19 32.95 kg/m  05/13/19 33.30 kg/m    Assessment/Interventions: Review of patient past medical history, allergies, medications, health status, including review of consultants reports, laboratory and other test data, was performed as part of comprehensive evaluation and provision of chronic care management services.   SDOH:  (Social Determinants of Health) assessments and interventions performed: Yes  SDOH Screenings   Alcohol Screen: Not on file  Depression (PHQ2-9): Low Risk   . PHQ-2 Score: 0  Financial Resource Strain: Not on file  Food Insecurity: Not on file  Housing: Not on file  Physical Activity: Not on file  Social Connections: Not on file  Stress: Not on file  Tobacco Use: Medium Risk  . Smoking Tobacco Use:  Former Smoker  . Smokeless Tobacco Use: Never Used  Transportation Needs: Not on file    CCM Care Plan  Allergies  Allergen Reactions  . Alendronate Sodium     REACTION: reaction not known  . Atorvastatin     REACTION: headaches  . Pravastatin Sodium     REACTION: headaches and arm pain    Medications Reviewed Today    Reviewed by Abner Greenspan, MD (Physician) on 02/17/20 at 1002  Med List Status: <None>  Medication Order Taking? Sig Documenting Provider Last Dose Status Informant  Ascorbic Acid (VITAMIN C) 1000 MG tablet [id number] Yes Take 500 mg by mouth daily. [provider] Taking Active Self  aspirin 81 MG EC tablet [id number] Yes Take 81 mg by mouth daily. [provider] Taking Active Self  Calcium Carbonate-Vit D-Min (CALCIUM 1200 PO) [id number] Yes Take 1 tablet by mouth 2 (two) times daily. [provider] Taking Active Self  Cholecalciferol (VITAMIN D) 2000 UNITS CAPS [id number] Yes Take 2,000 Units by mouth daily.  [provider] Taking Active Self  CRANBERRY PO [id number] Yes Take 4,200 Units by mouth daily. [provider] Taking Active Self  gemfibrozil (LOPID) 600 MG tablet [id number] Yes TAKE 1 TABLET BY MOUTH TWICE (2) DAILY Tower, Wynelle Fanny, MD Taking Active   ibandronate (BONIVA) 150 MG tablet [id number] Yes  [provider] Taking Active   lisinopril (ZESTRIL) 5 MG tablet [id number] Yes TAKE 1 TABLET BY MOUTH ONCE DAILY Tower, Wynelle Fanny, MD Taking Active   lovastatin (MEVACOR) 20 MG tablet [id number] Yes TAKE 1 TABLET BY MOUTH ONCE DAILY Tower, Wynelle Fanny, MD Taking Active   metFORMIN (GLUCOPHAGE) 1000 MG tablet [id number] Yes TAKE 1 TABLET BY MOUTH TWICE (2) DAILY WITH A MEAL Tower, Wynelle Fanny, MD Taking Active   Omega-3 300 MG CAPS [id number] Yes Take 300 mg by mouth 2 (two) times daily.  [provider] Taking Active Self  spironolactone (ALDACTONE) 25 MG tablet [id number] Yes TAKE 1/2 TABLET BY MOUTH ONCE DAILY Tower, Wynelle Fanny, MD Taking Active   vitamin B-12 (CYANOCOBALAMIN) 1000 MCG tablet [id number] Yes Take 1,000 mcg by mouth daily. [provider] Taking Active Self          Patient Active Problem List   Diagnosis Date Noted  . Need for hepatitis C screening test 12/13/2015  . Obesity 12/11/2014  . Herpes simplex 03/16/2014  . Pedal edema 06/15/2013  . History of fracture of arm 06/09/2013  . Encounter for Medicare annual wellness exam 06/17/2012  . Routine general medical examination at a health care facility 02/18/2011  . Vitamin D deficiency 07/30/2008  . DERMATOPHYTOSIS, NAIL 07/26/2006  . Diabetes type 2, controlled ([place]) 07/25/2006  . Hyperlipidemia associated with type 2 diabetes mellitus ([place]) 07/25/2006  . Essential hypertension 07/25/2006  . Osteoporosis 07/25/2006    Immunization History  Administered Date(s) Administered  . PFIZER(Purple Top)SARS-COV-2 Vaccination 04/04/2019, 04/25/2019, 11/30/2019  . Pneumococcal Conjugate-13 02/04/2018  . Pneumococcal Polysaccharide-23 02/11/2019  . Tdap 06/05/2011    Conditions to be addressed/monitored:  Hypertension, Hyperlipidemia, Diabetes and Osteoporosis  There are no care plans that you recently modified to display for this patient.   Current Barriers:  . {pharmacybarriers:24917} . ***  Pharmacist Clinical Goal(s):  Marland Kitchen Patient will {PHARMACYGOALCHOICES:24921} through collaboration with PharmD and provider.  . ***  Interventions: . 1:1 collaboration with Tower, Wynelle Fanny, MD regarding development and update of comprehensive plan of care as evidenced by provider attestation and co-signature . Inter-disciplinary care team collaboration (see longitudinal plan of care) . Comprehensive medication review performed; medication list updated in electronic medical record  Hypertension (BP goal <140/90) -{US controlled/uncontrolled:25276} -Current treatment: . Lisinopril 5 mg - 1 tablet daily . Spironolactone 25 mg - 1 tablet  daily -Medications previously tried: ***  -Current home readings: *** -Current dietary habits: *** -Current exercise habits: *** -{ACTIONS;DENIES/REPORTS:21021675::"Denies"} hypotensive/hypertensive symptoms -Educated on {CCM BP Counseling:25124} -Counseled to monitor BP at home ***, document, and provide log at future appointments -{CCMPHARMDINTERVENTION:25122}  Hyperlipidemia: (LDL goal < 100, TG < 150) -Controlled LDL - 79, TG 103 -Current treatment:  Gemfibrozil 600 mg - 1 tablet twice daily  Lovastatin 20 mg - 1 tablet daily  Aspirin 81 mg - 1 tablet daily   Fish oil -Medications previously tried: ***  -Current dietary patterns: *** -Current exercise habits: *** -Pt on Lopid > 10 years and unable to find lipid panel prior to initiation  -Educated on {CCM HLD Counseling:25126} -{CCMPHARMDINTERVENTION:25122}  Diabetes (A1c goal <7%) -Controlled - 6.3% -Current medications: Marland Kitchen Metformin 1000 mg - 1 tablet twice daily  -Medications previously tried: ***  -Current home glucose readings . fasting glucose: *** . post prandial glucose: *** -{ACTIONS;DENIES/REPORTS:21021675::"Denies"} hypoglycemic/hyperglycemic symptoms -Current meal patterns:  . breakfast: ***  . lunch: ***  . dinner: *** . snacks: *** . drinks: *** -Current exercise: *** -Educated on {CCM DM COUNSELING:25123} -Counseled to check feet daily and get yearly eye exams -{CCMPHARMDINTERVENTION:25122}  Ibandronate 150 mg - 1 tablet daily Vitamin D deficiency     Good level at 61.6 Vitamin D level is therapeutic with current      Follow by GYN DEXA Fall 2021 improving   Patient Goals/Self-Care Activities . Patient will:  - {pharmacypatientgoals:24919}  Follow Up Plan: {CM FOLLOW UP LKTG:25638}  Medication Assistance: {MEDASSISTANCEINFO:25044}  Patient's preferred pharmacy is: [facility], [place] [address] [place] [address] Phone:  [phone] Fax: [phone]  Uses pill box? {Yes or If no, why not?:20788} Pt endorses ***% compliance  We discussed: {Pharmacy options:24294} Patient decided to: {US Pharmacy ULAG:53646}  Care Plan and Follow Up Patient Decision:  {FOLLOWUP:24991}  Plan: {CM FOLLOW UP OEHO:12248}  Debbora Dus, PharmD Clinical Pharmacist [facility] at [facility] [phone]

## 2020-05-12 ENCOUNTER — Ambulatory Visit
Admission: EM | Admit: 2020-05-12 | Discharge: 2020-05-12 | Disposition: A | Discharge status: Home / Self Care | Payer: PPO | Attending: Emergency Medicine | Admitting: Emergency Medicine

## 2020-05-12 ENCOUNTER — Other Ambulatory Visit: Payer: Self-pay

## 2020-05-12 DIAGNOSIS — J0100 Acute maxillary sinusitis, unspecified: Secondary | ICD-10-CM

## 2020-05-12 DIAGNOSIS — J302 Other seasonal allergic rhinitis: Secondary | ICD-10-CM

## 2020-05-12 MED ORDER — AMOXICILLIN 875 MG PO TABS: 875.0000 mg | ORAL_TABLET | Freq: Two times a day (BID) | ORAL | 0 refills | Status: AC

## 2020-05-12 NOTE — ED Provider Notes (Signed)
Amy Fry    CSN: [id number] Arrival date & time: 05/12/20  1023      History   Chief Complaint Chief Complaint  Patient presents with  . Nasal Congestion    HPI Amy Fry is a 75 y.o. female.   Patient presents with 6-7 day history of runny nose, postnasal drip, sinus congestion, and cough.  Her symptoms are worse for the past few days.  She denies fever, chills, rash, shortness of breath, vomiting, diarrhea, or other symptoms.  Treatment attempted at home with Mucinex and nasal spray.  Her medical history includes diabetes, hypertension, arthritis, osteoporosis.  The history is provided by the patient and medical records.    Past Medical History:  Diagnosis Date  . Arthritis   . Diabetes mellitus    type II  . Early cataracts, bilateral    no sx yet   . Hyperlipidemia   . Hypertension   . Osteopenia     Patient Active Problem List   Diagnosis Date Noted  . Need for hepatitis C screening test 12/13/2015  . Obesity 12/11/2014  . Herpes simplex 03/16/2014  . Pedal edema 06/15/2013  . History of fracture of arm 06/09/2013  . Encounter for Medicare annual wellness exam 06/17/2012  . Routine general medical examination at a health care facility 02/18/2011  . Vitamin D deficiency 07/30/2008  . DERMATOPHYTOSIS, NAIL 07/26/2006  . Diabetes type 2, controlled ([place]) 07/25/2006  . Hyperlipidemia associated with type 2 diabetes mellitus ([place]) 07/25/2006  . Essential hypertension 07/25/2006  . Osteoporosis 07/25/2006    Past Surgical History:  Procedure Laterality Date  . CYSTOSCOPY  10-1999  . FRACTURE SURGERY  2010   rib fx- from fall   . LAPAROSCOPIC CHOLECYSTECTOMY    . OOPHORECTOMY    . ORIF HUMERUS FRACTURE Left 06/09/2013   Procedure: OPEN REDUCTION INTERNAL FIXATION (ORIF) PROXIMAL HUMERUS FRACTURE;  Surgeon: Rozanna Box, MD;  Location: [facility];  Service: Orthopedics;  Laterality: Left;    OB History   No obstetric history on file.       Home Medications    Prior to Admission medications   Medication Sig Start Date End Date Taking? Authorizing Provider  amoxicillin (AMOXIL) 875 MG tablet Take 1 tablet (875 mg total) by mouth 2 (two) times daily for 7 days. 05/12/20 05/19/20 Yes Sharion Balloon, NP  Ascorbic Acid (VITAMIN C) 1000 MG tablet Take 500 mg by mouth daily.    [provider]  aspirin 81 MG EC tablet Take 81 mg by mouth daily.    [provider]  Calcium Carbonate-Vit D-Min (CALCIUM 1200 PO) Take 1 tablet by mouth 2 (two) times daily.    [provider]  Cholecalciferol (VITAMIN D) 2000 UNITS CAPS Take 2,000 Units by mouth daily.     [provider]  CRANBERRY PO Take 4,200 Units by mouth daily.    [provider]  gemfibrozil (LOPID) 600 MG tablet TAKE 1 TABLET BY MOUTH TWICE (2) DAILY 02/10/20   Tower, Wynelle Fanny, MD  ibandronate (BONIVA) 150 MG tablet  01/10/18   [provider]  lisinopril (ZESTRIL) 5 MG tablet TAKE 1 TABLET BY MOUTH ONCE DAILY 02/10/20   Tower, Wynelle Fanny, MD  lovastatin (MEVACOR) 20 MG tablet TAKE 1 TABLET BY MOUTH ONCE DAILY 02/10/20   Tower, Wynelle Fanny, MD  metFORMIN (GLUCOPHAGE) 1000 MG tablet TAKE 1 TABLET BY MOUTH TWICE (2) DAILY WITH A MEAL 02/10/20   Tower, Wynelle Fanny, MD  Omega-3 300 MG CAPS Take 300 mg by mouth 2 (two) times daily.     [provider]  spironolactone (ALDACTONE) 25 MG tablet TAKE 1/2 TABLET BY MOUTH ONCE DAILY 02/10/20   Tower, Wynelle Fanny, MD  vitamin B-12 (CYANOCOBALAMIN) 1000 MCG tablet Take 1,000 mcg by mouth daily.    [provider]    Family History Family History  Problem Relation Age of Onset  . Heart attack Mother   . Heart attack Brother   . Stroke Brother   . Depression Sister   . Colon cancer Neg Hx   . Stomach cancer Neg Hx   . Esophageal cancer Neg Hx   . Rectal cancer Neg Hx     Social History Social History   Tobacco Use  . Smoking status: Former Research scientist (life sciences)  . Smokeless tobacco: Never Used   . Tobacco comment: a little as a teenager  Vaping Use  . Vaping Use: Never used  Substance Use Topics  . Alcohol use: No    Alcohol/week: 0.0 standard drinks  . Drug use: No     Allergies   Alendronate sodium, Atorvastatin, and Pravastatin sodium   Review of Systems Review of Systems  Constitutional: Negative for chills and fever.  HENT: Positive for congestion, postnasal drip, rhinorrhea and sinus pressure. Negative for ear pain and sore throat.   Eyes: Negative for pain and visual disturbance.  Respiratory: Positive for cough. Negative for shortness of breath.   Cardiovascular: Negative for chest pain and palpitations.  Gastrointestinal: Negative for abdominal pain and vomiting.  Genitourinary: Negative for dysuria and hematuria.  Musculoskeletal: Negative for arthralgias and back pain.  Skin: Negative for color change and rash.  Neurological: Negative for seizures and syncope.  All other systems reviewed and are negative.    Physical Exam Triage Vital Signs ED Triage Vitals  Enc Vitals Group     BP      Pulse      Resp      Temp      Temp src      SpO2      Weight      Height      Head Circumference      Peak Flow      Pain Score      Pain Loc      Pain Edu?      Excl. in [place]?    No data found.  Updated Vital Signs BP 122/77   Pulse 75   Temp 98.1 F (36.7 C)   Resp 18   SpO2 98%   Visual Acuity Right Eye Distance:   Left Eye Distance:   Bilateral Distance:    Right Eye Near:   Left Eye Near:    Bilateral Near:     Physical Exam Vitals and nursing note reviewed.  Constitutional:      General: She is not in acute distress.    Appearance: She is well-developed. She is not ill-appearing.  HENT:     Head: Normocephalic and atraumatic.     Right Ear: Tympanic membrane normal.     Left Ear: Tympanic membrane normal.     Nose: Congestion and rhinorrhea present.     Mouth/Throat:     Mouth: Mucous membranes are moist.     Pharynx: Oropharynx  is clear.  Eyes:     Conjunctiva/sclera: Conjunctivae normal.  Cardiovascular:     Rate and Rhythm: Normal rate and regular rhythm.     Heart sounds: Normal  heart sounds.  Pulmonary:     Effort: Pulmonary effort is normal. No respiratory distress.     Breath sounds: Normal breath sounds.  Abdominal:     Palpations: Abdomen is soft.     Tenderness: There is no abdominal tenderness.  Musculoskeletal:     Cervical back: Neck supple.  Skin:    General: Skin is warm and dry.  Neurological:     General: No focal deficit present.     Mental Status: She is alert and oriented to person, place, and time.     Gait: Gait normal.  Psychiatric:        Mood and Affect: Mood normal.        Behavior: Behavior normal.      UC Treatments / Results  Labs (all labs ordered are listed, but only abnormal results are displayed) Labs Reviewed - No data to display  EKG   Radiology No results found.  Procedures Procedures (including critical care time)  Medications Ordered in UC Medications - No data to display  Initial Impression / Assessment and Plan / UC Course  I have reviewed the triage vital signs and the nursing notes.  Pertinent labs & imaging results that were available during my care of the patient were reviewed by me and considered in my medical decision making (see chart for details).   Acute sinusitis, seasonal allergies.  Patient declines COVID test.  Treating with amoxicillin.  Instructed her to continue taking Mucinex and Flonase as directed.  Discussed that she should follow-up with her PCP if her symptoms are not improving.  Patient agrees to plan of care.   Final Clinical Impressions(s) / UC Diagnoses   Final diagnoses:  Acute non-recurrent maxillary sinusitis  Seasonal allergies     Discharge Instructions     Take the amoxicillin as directed for your sinus infection.    Continue to take Mucinex and use Flonase nasal spray.    Follow up with your primary care  provider if your symptoms are not improving.        ED Prescriptions    Medication Sig Dispense Auth. Provider   amoxicillin (AMOXIL) 875 MG tablet Take 1 tablet (875 mg total) by mouth 2 (two) times daily for 7 days. 14 tablet Sharion Balloon, NP     PDMP not reviewed this encounter.   Sharion Balloon, NP 05/12/20 1049

## 2020-05-12 NOTE — Discharge Instructions (Signed)
Take the amoxicillin as directed for your sinus infection.    Continue to take Mucinex and use Flonase nasal spray.    Follow up with your primary care provider if your symptoms are not improving.

## 2020-05-12 NOTE — ED Triage Notes (Signed)
Pt presents with complaints of sore throat that has subsided and runny nose and nasal congestion that continues x 1 week. Denies relief with nasal spray and mucinex. Pt denies covid test.

## 2020-08-03 ENCOUNTER — Other Ambulatory Visit: Payer: Self-pay | Admitting: Family Medicine

## 2020-08-04 NOTE — Telephone Encounter (Signed)
LOV 02/17/20 CPE Next appointment on 08/18/20 6 months follow up  Last filled on 02/10/20 #45 with 1 refill

## 2020-08-10 ENCOUNTER — Telehealth: Payer: Self-pay | Admitting: Family Medicine

## 2020-08-10 DIAGNOSIS — E1169 Type 2 diabetes mellitus with other specified complication: Secondary | ICD-10-CM

## 2020-08-10 DIAGNOSIS — E559 Vitamin D deficiency, unspecified: Secondary | ICD-10-CM

## 2020-08-10 DIAGNOSIS — I10 Essential (primary) hypertension: Secondary | ICD-10-CM

## 2020-08-10 DIAGNOSIS — E119 Type 2 diabetes mellitus without complications: Secondary | ICD-10-CM

## 2020-08-10 DIAGNOSIS — M810 Age-related osteoporosis without current pathological fracture: Secondary | ICD-10-CM

## 2020-08-10 NOTE — Telephone Encounter (Signed)
-----   Message from Ellamae Sia sent at 07/25/2020  9:28 AM EDT ----- Regarding: Lab orders for Thursday, 7.28.22 Patient is scheduled for CPX labs, please order future labs, Thanks , Karna Christmas

## 2020-08-11 ENCOUNTER — Other Ambulatory Visit: Payer: Self-pay

## 2020-08-11 ENCOUNTER — Other Ambulatory Visit (INDEPENDENT_AMBULATORY_CARE_PROVIDER_SITE_OTHER): Payer: PPO

## 2020-08-11 DIAGNOSIS — E559 Vitamin D deficiency, unspecified: Secondary | ICD-10-CM

## 2020-08-11 DIAGNOSIS — E785 Hyperlipidemia, unspecified: Secondary | ICD-10-CM | POA: Diagnosis not present

## 2020-08-11 DIAGNOSIS — E1169 Type 2 diabetes mellitus with other specified complication: Secondary | ICD-10-CM | POA: Diagnosis not present

## 2020-08-11 DIAGNOSIS — E119 Type 2 diabetes mellitus without complications: Secondary | ICD-10-CM | POA: Diagnosis not present

## 2020-08-11 DIAGNOSIS — I10 Essential (primary) hypertension: Secondary | ICD-10-CM

## 2020-08-11 LAB — CBC WITH DIFFERENTIAL/PLATELET
Basophils Absolute: 0 10*3/uL (ref 0.0–0.1)
Basophils Relative: 0.3 % (ref 0.0–3.0)
Eosinophils Absolute: 0.2 10*3/uL (ref 0.0–0.7)
Eosinophils Relative: 3.6 % (ref 0.0–5.0)
HCT: 40.7 % (ref 36.0–46.0)
Hemoglobin: 13.5 g/dL (ref 12.0–15.0)
Lymphocytes Relative: 45.3 % (ref 12.0–46.0)
Lymphs Abs: 2.2 10*3/uL (ref 0.7–4.0)
MCHC: 33.1 g/dL (ref 30.0–36.0)
MCV: 95.1 fl (ref 78.0–100.0)
Monocytes Absolute: 0.4 10*3/uL (ref 0.1–1.0)
Monocytes Relative: 9.1 % (ref 3.0–12.0)
Neutro Abs: 2 10*3/uL (ref 1.4–7.7)
Neutrophils Relative %: 41.7 % — ABNORMAL LOW (ref 43.0–77.0)
Platelets: 237 10*3/uL (ref 150.0–400.0)
RBC: 4.27 Mil/uL (ref 3.87–5.11)
RDW: 13.5 % (ref 11.5–15.5)
WBC: 4.8 10*3/uL (ref 4.0–10.5)

## 2020-08-11 LAB — COMPREHENSIVE METABOLIC PANEL
ALT: 16 U/L (ref 0–35)
AST: 20 U/L (ref 0–37)
Albumin: 4.5 g/dL (ref 3.5–5.2)
Alkaline Phosphatase: 60 U/L (ref 39–117)
BUN: 19 mg/dL (ref 6–23)
CO2: 28 mEq/L (ref 19–32)
Calcium: 10.5 mg/dL (ref 8.4–10.5)
Chloride: 103 mEq/L (ref 96–112)
Creatinine, Ser: 0.94 mg/dL (ref 0.40–1.20)
GFR: 60.07 mL/min (ref >60.00)
Glucose, Bld: 87 mg/dL (ref 70–99)
Potassium: 4.4 mEq/L (ref 3.5–5.1)
Sodium: 140 mEq/L (ref 135–145)
Total Bilirubin: 0.6 mg/dL (ref 0.2–1.2)
Total Protein: 6.9 g/dL (ref 6.0–8.3)

## 2020-08-11 LAB — HEMOGLOBIN A1C: Hgb A1c MFr Bld: 6.2 % (ref 4.6–6.5)

## 2020-08-11 LAB — LIPID PANEL
Cholesterol: 168 mg/dL (ref 0–200)
HDL: 60.9 mg/dL (ref >39.00)
LDL Cholesterol: 80 mg/dL (ref 0–99)
NonHDL: 106.79
Total CHOL/HDL Ratio: 3
Triglycerides: 132 mg/dL (ref 0.0–149.0)
VLDL: 26.4 mg/dL (ref 0.0–40.0)

## 2020-08-11 LAB — VITAMIN D 25 HYDROXY (VIT D DEFICIENCY, FRACTURES): VITD: 68.01 ng/mL (ref 30.00–100.00)

## 2020-08-11 LAB — TSH: TSH: 2.69 u[IU]/mL (ref 0.35–5.50)

## 2020-08-18 ENCOUNTER — Other Ambulatory Visit: Payer: Self-pay

## 2020-08-18 ENCOUNTER — Encounter: Payer: Self-pay | Admitting: Family Medicine

## 2020-08-18 ENCOUNTER — Ambulatory Visit (INDEPENDENT_AMBULATORY_CARE_PROVIDER_SITE_OTHER): Payer: PPO | Admitting: Family Medicine

## 2020-08-18 VITALS — BP 135/80 | HR 69 | Temp 97.4°F | Ht 62.75 in | Wt 165.2 lb

## 2020-08-18 DIAGNOSIS — E1169 Type 2 diabetes mellitus with other specified complication: Secondary | ICD-10-CM | POA: Diagnosis not present

## 2020-08-18 DIAGNOSIS — E785 Hyperlipidemia, unspecified: Secondary | ICD-10-CM | POA: Diagnosis not present

## 2020-08-18 DIAGNOSIS — I10 Essential (primary) hypertension: Secondary | ICD-10-CM

## 2020-08-18 DIAGNOSIS — E119 Type 2 diabetes mellitus without complications: Secondary | ICD-10-CM

## 2020-08-18 NOTE — Patient Instructions (Addendum)
Get your eye exam and please ask them to send [place] a copy   Be careful outdoors- try to do your work early or late   Labs are stable  Blood pressure is ok   Keep working on [employer] and exercise and weight loss   Follow up in 6 months

## 2020-08-18 NOTE — Assessment & Plan Note (Signed)
bp in fair control at this time  BP Readings from Last 1 Encounters:  08/18/20 135/80   No changes needed Most recent labs reviewed  Disc lifstyle change with low sodium diet and exercise  Plan to continue lisinopril 5 mg daily and spironolactone 25 mg daily

## 2020-08-18 NOTE — Assessment & Plan Note (Signed)
Disc goals for lipids and reasons to control them Rev last labs with pt Rev low sat fat diet in detail Plan to continue lovastatin 20 mg daily  gemfibrozil 600 mg bid  DM is well controlled

## 2020-08-18 NOTE — Progress Notes (Signed)
Subjective:    Patient ID: Amy Fry, female    DOB: 09/16/1945, 75 y.o.   MRN: [id number]  This visit occurred during the SARS-CoV-2 public health emergency.  Safety protocols were in place, including screening questions prior to the visit, additional usage of staff PPE, and extensive cleaning of exam room while observing appropriate contact time as indicated for disinfecting solutions.   HPI Pt presents for 6 mo f/u of chronic health problems including DM2 and HTN and hyperlipidemia   Wt Readings from Last 3 Encounters:  08/18/20 165 lb 3 oz (74.9 kg)  02/17/20 171 lb 8 oz (77.8 kg)  10/26/19 186 lb (84.4 kg)   29.50 kg/m Not doing a lot lately  Feeling ok   Working on weight- uses the pedaler every day  Legs are stronger   HTN bp is stable today  No cp or palpitations or headaches or edema  No side effects to medicines  BP Readings from Last 3 Encounters:  08/18/20 (!) 144/70  05/12/20 122/77  02/17/20 132/78     Pulse Readings from Last 3 Encounters:  08/18/20 69  05/12/20 75  02/17/20 63    Takes lisinopril 5 mg daily  Spironolactone 25 mg daily   DM2 Lab Results  Component Value Date   HGBA1C 6.2 08/11/2020   This is down from 6.3 Well controlled  Metformin 1000 mg bid -no side effects   On ace and statin  Eye exam 8/20 (per pt 8/21)  Has appt this month - [place] Eating very healthy and loosing weight  Does not eat breakfast   Avoid sweets Uses sugar free things instead  Produce -good  Not a lot of pasta  Too much bread    Hyperlipidemia Lab Results  Component Value Date   CHOL 168 08/11/2020   CHOL 152 02/10/2020   CHOL 155 10/19/2019   Lab Results  Component Value Date   HDL 60.90 08/11/2020   HDL 52.40 02/10/2020   HDL 48.60 10/19/2019   Lab Results  Component Value Date   LDLCALC 80 08/11/2020   LDLCALC 79 02/10/2020   LDLCALC 75 10/19/2019   Lab Results  Component Value Date   TRIG 132.0 08/11/2020   TRIG 103.0  02/10/2020   TRIG 159.0 (H) 10/19/2019   Lab Results  Component Value Date   CHOLHDL 3 08/11/2020   CHOLHDL 3 02/10/2020   CHOLHDL 3 10/19/2019   No results found for: LDLDIRECT Taking  Lovastatin 20 mg daily and gemfibrozil 600 mg bid    Patient Active Problem List   Diagnosis Date Noted   Need for hepatitis C screening test 12/13/2015   Obesity 12/11/2014   Herpes simplex 03/16/2014   Pedal edema 06/15/2013   History of fracture of arm 06/09/2013   Encounter for Medicare annual wellness exam 06/17/2012   Routine general medical examination at a health care facility 02/18/2011   Vitamin D deficiency 07/30/2008   DERMATOPHYTOSIS, NAIL 07/26/2006   Diabetes type 2, controlled ([place]) 07/25/2006   Hyperlipidemia associated with type 2 diabetes mellitus ([place]) 07/25/2006   Essential hypertension 07/25/2006   Osteoporosis 07/25/2006   Past Medical History:  Diagnosis Date   Arthritis    Diabetes mellitus    type II   Early cataracts, bilateral    no sx yet    Hyperlipidemia    Hypertension    Osteopenia    Past Surgical History:  Procedure Laterality Date   CYSTOSCOPY  10-1999   FRACTURE SURGERY  2010   rib fx- from fall    LAPAROSCOPIC CHOLECYSTECTOMY     OOPHORECTOMY     ORIF HUMERUS FRACTURE Left 06/09/2013   Procedure: OPEN REDUCTION INTERNAL FIXATION (ORIF) PROXIMAL HUMERUS FRACTURE;  Surgeon: Rozanna Box, MD;  Location: [facility];  Service: Orthopedics;  Laterality: Left;   Social History   Tobacco Use   Smoking status: Former   Smokeless tobacco: Never   Tobacco comments:    a little as a teenager  Vaping Use   Vaping Use: Never used  Substance Use Topics   Alcohol use: No    Alcohol/week: 0.0 standard drinks   Drug use: No   Family History  Problem Relation Age of Onset   Heart attack Mother    Heart attack Brother    Stroke Brother    Depression Sister    Colon cancer Neg Hx    Stomach cancer Neg Hx    Esophageal cancer Neg Hx    Rectal  cancer Neg Hx    Allergies  Allergen Reactions   Alendronate Sodium     REACTION: reaction not known   Atorvastatin     REACTION: headaches   Pravastatin Sodium     REACTION: headaches and arm pain   Current Outpatient Medications on File Prior to Visit  Medication Sig Dispense Refill   Ascorbic Acid (VITAMIN C) 1000 MG tablet Take 500 mg by mouth daily.     aspirin 81 MG EC tablet Take 81 mg by mouth daily.     Calcium Carbonate-Vit D-Min (CALCIUM 1200 PO) Take 1 tablet by mouth 2 (two) times daily.     Cholecalciferol (VITAMIN D) 2000 UNITS CAPS Take 2,000 Units by mouth daily.      CRANBERRY PO Take 4,200 Units by mouth daily.     gemfibrozil (LOPID) 600 MG tablet TAKE 1 TABLET BY MOUTH TWICE (2) DAILY 180 tablet 2   ibandronate (BONIVA) 150 MG tablet      lisinopril (ZESTRIL) 5 MG tablet TAKE 1 TABLET BY MOUTH ONCE DAILY 90 tablet 2   lovastatin (MEVACOR) 20 MG tablet TAKE 1 TABLET BY MOUTH ONCE DAILY 90 tablet 2   metFORMIN (GLUCOPHAGE) 1000 MG tablet TAKE 1 TABLET BY MOUTH TWICE (2) DAILY WITH A MEAL 180 tablet 2   Omega-3 300 MG CAPS Take 300 mg by mouth 2 (two) times daily.      spironolactone (ALDACTONE) 25 MG tablet TAKE 1/2 TABLET BY MOUTH ONCE DAILY 45 tablet 2   vitamin B-12 (CYANOCOBALAMIN) 1000 MCG tablet Take 1,000 mcg by mouth daily.     No current facility-administered medications on file prior to visit.    Review of Systems  Constitutional:  Negative for activity change, appetite change, fatigue, fever and unexpected weight change.  HENT:  Negative for congestion, ear pain, rhinorrhea, sinus pressure and sore throat.   Eyes:  Negative for pain, redness and visual disturbance.  Respiratory:  Negative for cough, shortness of breath and wheezing.   Cardiovascular:  Negative for chest pain and palpitations.  Gastrointestinal:  Negative for abdominal pain, blood in stool, constipation and diarrhea.  Endocrine: Negative for polydipsia and polyuria.  Genitourinary:   Negative for dysuria, frequency and urgency.  Musculoskeletal:  Negative for arthralgias, back pain and myalgias.  Skin:  Negative for pallor and rash.  Allergic/Immunologic: Negative for environmental allergies.  Neurological:  Negative for dizziness, syncope and headaches.  Hematological:  Negative for adenopathy. Does not bruise/bleed easily.  Psychiatric/Behavioral:  Negative for decreased  concentration and dysphoric mood. The patient is not nervous/anxious.       Objective:   Physical Exam Constitutional:      General: She is not in acute distress.    Appearance: Normal appearance. She is well-developed. She is not ill-appearing or diaphoretic.     Comments: Overweight   HENT:     Head: Normocephalic and atraumatic.     Mouth/Throat:     Mouth: Mucous membranes are moist.  Eyes:     Conjunctiva/sclera: Conjunctivae normal.     Pupils: Pupils are equal, round, and reactive to light.  Neck:     Thyroid: No thyromegaly.     Vascular: No carotid bruit or JVD.  Cardiovascular:     Rate and Rhythm: Normal rate and regular rhythm.     Heart sounds: Normal heart sounds.    No gallop.  Pulmonary:     Effort: Pulmonary effort is normal. No respiratory distress.     Breath sounds: Normal breath sounds. No wheezing or rales.  Abdominal:     General: Bowel sounds are normal. There is no abdominal bruit.     Palpations: Abdomen is soft.  Musculoskeletal:     Cervical back: Normal range of motion and neck supple.     Right lower leg: No edema.     Left lower leg: No edema.  Lymphadenopathy:     Cervical: No cervical adenopathy.  Skin:    General: Skin is warm and dry.     Coloration: Skin is not pale.     Findings: No rash.  Neurological:     Mental Status: She is alert.     Sensory: No sensory deficit.     Coordination: Coordination normal.     Deep Tendon Reflexes: Reflexes are normal and symmetric. Reflexes normal.  Psychiatric:        Mood and Affect: Mood normal.           Assessment & Plan:   Problem List Items Addressed This Visit       Cardiovascular and Mediastinum   Essential hypertension    bp in fair control at this time  BP Readings from Last 1 Encounters:  08/18/20 135/80  No changes needed Most recent labs reviewed  Disc lifstyle change with low sodium diet and exercise  Plan to continue lisinopril 5 mg daily and spironolactone 25 mg daily        Endocrine   Diabetes type 2, controlled ([place]) - Primary    Lab Results  Component Value Date   HGBA1C 6.2 08/11/2020  This continues to improve with weight loss and good diet/exercise  Sent for last eye exam report Nl foot exam Taking ace and statin  Enc flu shot in the fall      Hyperlipidemia associated with type 2 diabetes mellitus ([place])    Disc goals for lipids and reasons to control them Rev last labs with pt Rev low sat fat diet in detail Plan to continue lovastatin 20 mg daily  gemfibrozil 600 mg bid  DM is well controlled

## 2020-08-18 NOTE — Assessment & Plan Note (Signed)
Lab Results  Component Value Date   HGBA1C 6.2 08/11/2020   This continues to improve with weight loss and good diet/exercise  Sent for last eye exam report Nl foot exam Taking ace and statin  Enc flu shot in the fall

## 2020-08-31 ENCOUNTER — Encounter: Payer: Self-pay | Admitting: Ophthalmology

## 2020-09-08 DIAGNOSIS — H524 Presbyopia: Secondary | ICD-10-CM | POA: Diagnosis not present

## 2020-09-08 DIAGNOSIS — H25013 Cortical age-related cataract, bilateral: Secondary | ICD-10-CM | POA: Diagnosis not present

## 2020-09-08 DIAGNOSIS — E119 Type 2 diabetes mellitus without complications: Secondary | ICD-10-CM | POA: Diagnosis not present

## 2020-09-08 LAB — HM DIABETES EYE EXAM

## 2020-09-30 ENCOUNTER — Encounter: Payer: Self-pay | Admitting: Family Medicine

## 2020-12-01 DIAGNOSIS — Z01419 Encounter for gynecological examination (general) (routine) without abnormal findings: Secondary | ICD-10-CM | POA: Diagnosis not present

## 2020-12-01 DIAGNOSIS — Z6828 Body mass index (BMI) 28.0-28.9, adult: Secondary | ICD-10-CM | POA: Diagnosis not present

## 2020-12-01 DIAGNOSIS — Z1231 Encounter for screening mammogram for malignant neoplasm of breast: Secondary | ICD-10-CM | POA: Diagnosis not present

## 2020-12-01 LAB — HM MAMMOGRAPHY: HM Mammogram: NORMAL (ref 0–4)

## 2021-03-14 ENCOUNTER — Encounter: Payer: Self-pay | Admitting: Family Medicine

## 2021-03-14 ENCOUNTER — Other Ambulatory Visit: Payer: Self-pay

## 2021-03-14 ENCOUNTER — Ambulatory Visit (INDEPENDENT_AMBULATORY_CARE_PROVIDER_SITE_OTHER): Payer: PPO | Admitting: Family Medicine

## 2021-03-14 VITALS — BP 124/72 | HR 63 | Temp 97.4°F | Ht 62.75 in | Wt 159.5 lb

## 2021-03-14 DIAGNOSIS — I10 Essential (primary) hypertension: Secondary | ICD-10-CM

## 2021-03-14 DIAGNOSIS — E785 Hyperlipidemia, unspecified: Secondary | ICD-10-CM | POA: Diagnosis not present

## 2021-03-14 DIAGNOSIS — E1169 Type 2 diabetes mellitus with other specified complication: Secondary | ICD-10-CM

## 2021-03-14 DIAGNOSIS — E119 Type 2 diabetes mellitus without complications: Secondary | ICD-10-CM

## 2021-03-14 LAB — POCT GLYCOSYLATED HEMOGLOBIN (HGB A1C): Hemoglobin A1C: 5.6 % (ref 4.0–5.6)

## 2021-03-14 MED ORDER — LOVASTATIN 20 MG PO TABS: 20.0000 mg | ORAL_TABLET | Freq: Every day | ORAL | 3 refills | Status: DC

## 2021-03-14 MED ORDER — GEMFIBROZIL 600 MG PO TABS: ORAL_TABLET | ORAL | 3 refills | Status: DC

## 2021-03-14 MED ORDER — METFORMIN HCL 1000 MG PO TABS: ORAL_TABLET | ORAL | 3 refills | Status: DC

## 2021-03-14 MED ORDER — LISINOPRIL 5 MG PO TABS: 5.0000 mg | ORAL_TABLET | Freq: Every day | ORAL | 3 refills | Status: DC

## 2021-03-14 MED ORDER — SPIRONOLACTONE 25 MG PO TABS: 12.5000 mg | ORAL_TABLET | Freq: Every day | ORAL | 3 refills | Status: DC

## 2021-03-14 NOTE — Patient Instructions (Addendum)
Keep up the great work with diet and exercise and weight loss   Blood sugar control is great   No change in medicines   Follow up in 6 months for your annual exam

## 2021-03-14 NOTE — Assessment & Plan Note (Signed)
Lab Results  Component Value Date   HGBA1C 5.6 03/14/2021   This is down from 6.2 Commended on weight loss/better diet and exercise  Plan to continue metformin 1000 mg bid  Rev last labs  Taking ace and statin  Enc her to keep exercising  Eye exam nl 08/2020 Nl foot exam F/u 6 mo

## 2021-03-14 NOTE — Assessment & Plan Note (Signed)
bp in fair control at this time  BP Readings from Last 1 Encounters:  03/14/21 124/72   No changes needed Most recent labs reviewed  Disc lifstyle change with low sodium diet and exercise  Plan to continue lisinopril 5 mg daily and spironolactone 12.5 mg daily  Weight loss and exercise have helped also

## 2021-03-14 NOTE — Progress Notes (Signed)
Subjective:    Patient ID: Amy Fry, female    DOB: 20-Aug-1945, 76 y.o.   MRN: [id number]  This visit occurred during the SARS-CoV-2 public health emergency.  Safety protocols were in place, including screening questions prior to the visit, additional usage of staff PPE, and extensive cleaning of exam room while observing appropriate contact time as indicated for disinfecting solutions.   HPI Pt presents for f/u of DM2 and chronic health problems   Wt Readings from Last 3 Encounters:  03/14/21 159 lb 8 oz (72.3 kg)  08/18/20 165 lb 3 oz (74.9 kg)  02/17/20 171 lb 8 oz (77.8 kg)   28.48 kg/m  Loosing weight  Uses the Qvie daily/pedaling  Also eating healthy   HTN bp is stable today  No cp or palpitations or headaches or edema  No side effects to medicines  BP Readings from Last 3 Encounters:  03/14/21 124/72  08/18/20 135/80  05/12/20 122/77     Lisinopril 5 mg daily  Spironolactone 25 mg 1/2 pill daily Due for medicine refill   DM2 Lab Results  Component Value Date   HGBA1C 5.6 03/14/2021   Improved from 6.2 with weight loss Metformin 1000 mg bid   No longer eats breakfast-not hungry  No symptoms of hypoglycemia   No sweets  No sugar drinks   Not a lot of pasta  Eats bread-has to be careful of that   Takes ace and statin  Eye exam 08/2020    Hyperlipidemia Lab Results  Component Value Date   CHOL 168 08/11/2020   HDL 60.90 08/11/2020   LDLCALC 80 08/11/2020   TRIG 132.0 08/11/2020   CHOLHDL 3 08/11/2020   Lovastatin 20 mg daily  Gemfibrozil 600 mg bid    Lab Results  Component Value Date   CREATININE 0.94 08/11/2020   BUN 19 08/11/2020   NA 140 08/11/2020   K 4.4 08/11/2020   CL 103 08/11/2020   CO2 28 08/11/2020   Lab Results  Component Value Date   ALT 16 08/11/2020   AST 20 08/11/2020   ALKPHOS 60 08/11/2020   BILITOT 0.6 08/11/2020    Patient Active Problem List   Diagnosis Date Noted   Need for hepatitis C screening  test 12/13/2015   Obesity 12/11/2014   Herpes simplex 03/16/2014   Pedal edema 06/15/2013   History of fracture of arm 06/09/2013   Encounter for Medicare annual wellness exam 06/17/2012   Routine general medical examination at a health care facility 02/18/2011   Vitamin D deficiency 07/30/2008   DERMATOPHYTOSIS, NAIL 07/26/2006   Diabetes type 2, controlled ([place]) 07/25/2006   Hyperlipidemia associated with type 2 diabetes mellitus ([place]) 07/25/2006   Essential hypertension 07/25/2006   Osteoporosis 07/25/2006   Past Medical History:  Diagnosis Date   Arthritis    Diabetes mellitus    type II   Early cataracts, bilateral    no sx yet    Hyperlipidemia    Hypertension    Osteopenia    Past Surgical History:  Procedure Laterality Date   CYSTOSCOPY  10-1999   FRACTURE SURGERY  2010   rib fx- from fall    LAPAROSCOPIC CHOLECYSTECTOMY     OOPHORECTOMY     ORIF HUMERUS FRACTURE Left 06/09/2013   Procedure: OPEN REDUCTION INTERNAL FIXATION (ORIF) PROXIMAL HUMERUS FRACTURE;  Surgeon: Rozanna Box, MD;  Location: [facility];  Service: Orthopedics;  Laterality: Left;   Social History   Tobacco Use   Smoking  status: Former   Smokeless tobacco: Never   Tobacco comments:    a little as a teenager  Vaping Use   Vaping Use: Never used  Substance Use Topics   Alcohol use: No    Alcohol/week: 0.0 standard drinks   Drug use: No   Family History  Problem Relation Age of Onset   Heart attack Mother    Heart attack Brother    Stroke Brother    Depression Sister    Colon cancer Neg Hx    Stomach cancer Neg Hx    Esophageal cancer Neg Hx    Rectal cancer Neg Hx    Allergies  Allergen Reactions   Alendronate Sodium     REACTION: reaction not known   Atorvastatin     REACTION: headaches   Pravastatin Sodium     REACTION: headaches and arm pain   Current Outpatient Medications on File Prior to Visit  Medication Sig Dispense Refill   Ascorbic Acid (VITAMIN C) 1000 MG tablet  Take 500 mg by mouth daily.     aspirin 81 MG EC tablet Take 81 mg by mouth daily.     Calcium Carbonate-Vit D-Min (CALCIUM 1200 PO) Take 1 tablet by mouth 2 (two) times daily.     Cholecalciferol (VITAMIN D) 2000 UNITS CAPS Take 2,000 Units by mouth daily.      CRANBERRY PO Take 4,200 Units by mouth daily.     ibandronate (BONIVA) 150 MG tablet      Omega-3 300 MG CAPS Take 300 mg by mouth 2 (two) times daily.      vitamin B-12 (CYANOCOBALAMIN) 1000 MCG tablet Take 1,000 mcg by mouth daily.     No current facility-administered medications on file prior to visit.     Review of Systems  Constitutional:  Negative for activity change, appetite change, fatigue, fever and unexpected weight change.  HENT:  Negative for congestion, ear pain, rhinorrhea, sinus pressure and sore throat.   Eyes:  Negative for pain, redness and visual disturbance.  Respiratory:  Negative for cough, shortness of breath and wheezing.   Cardiovascular:  Negative for chest pain and palpitations.  Gastrointestinal:  Negative for abdominal pain, blood in stool, constipation and diarrhea.  Endocrine: Negative for polydipsia and polyuria.  Genitourinary:  Negative for dysuria, frequency and urgency.  Musculoskeletal:  Negative for arthralgias, back pain and myalgias.  Skin:  Negative for pallor and rash.  Allergic/Immunologic: Negative for environmental allergies.  Neurological:  Negative for dizziness, syncope and headaches.  Hematological:  Negative for adenopathy. Does not bruise/bleed easily.  Psychiatric/Behavioral:  Negative for decreased concentration and dysphoric mood. The patient is not nervous/anxious.       Objective:   Physical Exam Constitutional:      General: She is not in acute distress.    Appearance: Normal appearance. She is well-developed and normal weight. She is not ill-appearing or diaphoretic.  HENT:     Head: Normocephalic and atraumatic.  Eyes:     Conjunctiva/sclera: Conjunctivae normal.      Pupils: Pupils are equal, round, and reactive to light.  Neck:     Thyroid: No thyromegaly.     Vascular: No carotid bruit or JVD.  Cardiovascular:     Rate and Rhythm: Normal rate and regular rhythm.     Heart sounds: Normal heart sounds.    No gallop.  Pulmonary:     Effort: Pulmonary effort is normal. No respiratory distress.     Breath sounds: Normal breath sounds.  No wheezing or rales.  Abdominal:     General: Bowel sounds are normal. There is no distension or abdominal bruit.     Palpations: Abdomen is soft. There is no mass.     Tenderness: There is no abdominal tenderness.  Musculoskeletal:     Cervical back: Normal range of motion and neck supple.     Right lower leg: No edema.     Left lower leg: No edema.  Lymphadenopathy:     Cervical: No cervical adenopathy.  Skin:    General: Skin is warm and dry.     Coloration: Skin is not pale.     Findings: No rash.  Neurological:     Mental Status: She is alert.     Coordination: Coordination normal.     Deep Tendon Reflexes: Reflexes are normal and symmetric. Reflexes normal.  Psychiatric:        Mood and Affect: Mood normal.          Assessment & Plan:   Problem List Items Addressed This Visit       Cardiovascular and Mediastinum   Essential hypertension    bp in fair control at this time  BP Readings from Last 1 Encounters:  03/14/21 124/72  No changes needed Most recent labs reviewed  Disc lifstyle change with low sodium diet and exercise  Plan to continue lisinopril 5 mg daily and spironolactone 12.5 mg daily  Weight loss and exercise have helped also      Relevant Medications   lovastatin (MEVACOR) 20 MG tablet   gemfibrozil (LOPID) 600 MG tablet   lisinopril (ZESTRIL) 5 MG tablet   spironolactone (ALDACTONE) 25 MG tablet     Endocrine   Diabetes type 2, controlled ([place]) - Primary    Lab Results  Component Value Date   HGBA1C 5.6 03/14/2021  This is down from 6.2 Commended on weight  loss/better diet and exercise  Plan to continue metformin 1000 mg bid  Rev last labs  Taking ace and statin  Enc her to keep exercising  Eye exam nl 08/2020 Nl foot exam F/u 6 mo      Relevant Medications   metFORMIN (GLUCOPHAGE) 1000 MG tablet   lovastatin (MEVACOR) 20 MG tablet   lisinopril (ZESTRIL) 5 MG tablet   Other Relevant Orders   POCT HgB A1C (Completed)   Hyperlipidemia associated with type 2 diabetes mellitus ([place])    Disc goals for lipids and reasons to control them Rev last labs with pt Rev low sat fat diet in detail Last LDL of 80  Plan to continue good diet  Also lovastatin 20 mg daily and gemfibrozil 600 mg bid  F/u in 6 mo      Relevant Medications   metFORMIN (GLUCOPHAGE) 1000 MG tablet   lovastatin (MEVACOR) 20 MG tablet   gemfibrozil (LOPID) 600 MG tablet   lisinopril (ZESTRIL) 5 MG tablet   spironolactone (ALDACTONE) 25 MG tablet

## 2021-03-14 NOTE — Assessment & Plan Note (Signed)
Disc goals for lipids and reasons to control them Rev last labs with pt Rev low sat fat diet in detail Last LDL of 80  Plan to continue good diet  Also lovastatin 20 mg daily and gemfibrozil 600 mg bid  F/u in 6 mo

## 2021-06-06 ENCOUNTER — Ambulatory Visit (INDEPENDENT_AMBULATORY_CARE_PROVIDER_SITE_OTHER): Payer: PPO

## 2021-06-06 VITALS — Ht 62.75 in | Wt 159.0 lb

## 2021-06-06 DIAGNOSIS — Z0000 Encounter for general adult medical examination without abnormal findings: Secondary | ICD-10-CM | POA: Diagnosis not present

## 2021-06-06 NOTE — Progress Notes (Signed)
Subjective:   Amy Fry is a 76 y.o. female who presents for Medicare Annual (Subsequent) preventive examination.  Review of Systems    Virtual Visit via Telephone Note  I connected with  [name] on 06/06/21 at  2:30 PM EDT by telephone and verified that I am speaking with the correct person using two identifiers.  Location: Patient: Home Provider: Office Persons participating in the virtual visit: patient/Nurse Health Advisor   I discussed the limitations, risks, security and privacy concerns of performing an evaluation and management service by telephone and the availability of in person appointments. The patient expressed understanding and agreed to proceed.  Interactive audio and video telecommunications were attempted between this nurse and patient, however failed, due to patient having technical difficulties OR patient did not have access to video capability.  We continued and completed visit with audio only.  Some vital signs may be absent or patient reported.   Amy Peaches, LPN  Cardiac Risk Factors include: advanced age (>4mn, >>3women);hypertension     Objective:    Today's Vitals   06/06/21 1446  Weight: 159 lb (72.1 kg)  Height: 5' 2.75" (1.594 m)   Body mass index is 28.39 kg/m.     06/06/2021    2:56 PM 02/05/2019   10:09 AM 01/30/2018    9:22 AM 06/20/2017    7:50 AM 01/24/2017   10:56 AM 12/14/2015    9:23 AM 06/09/2013    2:59 PM  Advanced Directives  Does Patient Have a Medical Advance Directive? No No No No No No Patient does not have advance directive  Would patient like information on creating a medical advance directive? No - Patient declined No - Patient declined No - Patient declined  No - Patient declined    Pre-existing out of facility DNR order (yellow form or pink MOST form)       No    Current Medications (verified) Outpatient Encounter Medications as of 06/06/2021  Medication Sig   Ascorbic Acid (VITAMIN C) 1000 MG  tablet Take 500 mg by mouth daily.   aspirin 81 MG EC tablet Take 81 mg by mouth daily.   Calcium Carbonate-Vit D-Min (CALCIUM 1200 PO) Take 1 tablet by mouth 2 (two) times daily.   Cholecalciferol (VITAMIN D) 2000 UNITS CAPS Take 2,000 Units by mouth daily.    CRANBERRY PO Take 4,200 Units by mouth daily.   gemfibrozil (LOPID) 600 MG tablet TAKE 1 TABLET BY MOUTH TWICE (2) DAILY   ibandronate (BONIVA) 150 MG tablet    lisinopril (ZESTRIL) 5 MG tablet Take 1 tablet (5 mg total) by mouth daily.   lovastatin (MEVACOR) 20 MG tablet Take 1 tablet (20 mg total) by mouth daily.   metFORMIN (GLUCOPHAGE) 1000 MG tablet TAKE 1 TABLET BY MOUTH TWICE (2) DAILY WITH A MEAL   Omega-3 300 MG CAPS Take 300 mg by mouth 2 (two) times daily.    spironolactone (ALDACTONE) 25 MG tablet Take 0.5 tablets (12.5 mg total) by mouth daily.   vitamin B-12 (CYANOCOBALAMIN) 1000 MCG tablet Take 1,000 mcg by mouth daily.   No facility-administered encounter medications on file as of 06/06/2021.    Allergies (verified) Alendronate sodium, Atorvastatin, and Pravastatin sodium   History: Past Medical History:  Diagnosis Date   Arthritis    Diabetes mellitus    type II   Early cataracts, bilateral    no sx yet    Hyperlipidemia    Hypertension    Osteopenia  Past Surgical History:  Procedure Laterality Date   CYSTOSCOPY  10-1999   FRACTURE SURGERY  2010   rib fx- from fall    LAPAROSCOPIC CHOLECYSTECTOMY     OOPHORECTOMY     ORIF HUMERUS FRACTURE Left 06/09/2013   Procedure: OPEN REDUCTION INTERNAL FIXATION (ORIF) PROXIMAL HUMERUS FRACTURE;  Surgeon: Rozanna Box, MD;  Location: [facility];  Service: Orthopedics;  Laterality: Left;   Family History  Problem Relation Age of Onset   Heart attack Mother    Heart attack Brother    Stroke Brother    Depression Sister    Colon cancer Neg Hx    Stomach cancer Neg Hx    Esophageal cancer Neg Hx    Rectal cancer Neg Hx    Social History   Socioeconomic  History   Marital status: Married    Spouse name: Not on file   Number of children: 2   Years of education: Not on file   Highest education level: Not on file  Occupational History   Occupation: CNA    Employer: [employer]  Tobacco Use   Smoking status: Former   Smokeless tobacco: Never   Tobacco comments:    a little as a teenager  Scientific laboratory technician Use: Never used  Substance and Sexual Activity   Alcohol use: No    Alcohol/week: 0.0 standard drinks   Drug use: No   Sexual activity: Never  Other Topics Concern   Not on file  Social History Narrative   Not on file   Social Determinants of Health   Financial Resource Strain: Low Risk    Difficulty of Paying Living Expenses: Not hard at all  Food Insecurity: No Food Insecurity   Worried About Charity fundraiser in the Last Year: Never true   [place] in the Last Year: Never true  Transportation Needs: No Transportation Needs   Lack of Transportation (Medical): No   Lack of Transportation (Non-Medical): No  Physical Activity: Sufficiently Active   Days of Exercise per Week: 7 days   Minutes of Exercise per Session: 30 min  Stress: No Stress Concern Present   Feeling of Stress : Not at all  Social Connections: Socially Integrated   Frequency of Communication with Friends and Family: More than three times a week   Frequency of Social Gatherings with Friends and Family: More than three times a week   Attends Religious Services: More than 4 times per year   Active Member of [employer] or Organizations: Yes   Attends Music therapist: More than 4 times per year   Marital Status: Married    Tobacco Counseling Counseling given: Not Answered Tobacco comments: a little as a teenager   Clinical Intake:  Pre-visit preparation completed: NoNutrition Risk Assessment:  Has the patient had any N/V/D within the last 2 months?  No  Does the patient have any non-healing wounds?  No  Has the patient  had any unintentional weight loss or weight gain?  No   Diabetes:  Is the patient diabetic?  Yes  Pre diabetic If diabetic, was a CBG obtained today?  No  Did the patient bring in their glucometer from home?  No  How often do you monitor your CBG's? PRN.   Financial Strains and Diabetes Management:  Are you having any financial strains with the device, your supplies or your medication? No .  Does the patient want to be seen by Chronic Care  Management for management of their diabetes?  No  Would the patient like to be referred to a Nutritionist or for Diabetic Management?  No   Diabetic Exams:  Diabetic Eye Exam: Completed Yes. Overdue for diabetic eye exam. Pt has been advised about the importance in completing this exam. A referral has been placed today. Message sent to referral coordinator for scheduling purposes. Advised pt to expect a call from office referred to regarding appt.  Diabetic Foot Exam: Completed Yes. Pt has been advised about the importance in completing this exam. Pt is scheduled for diabetic foot exam on Followed by PCP.     Diabetic? Pre Diabetic  Activities of Daily Living    06/06/2021    2:55 PM  In your present state of health, do you have any difficulty performing the following activities:  Hearing? 0  Vision? 0  Difficulty concentrating or making decisions? 0  Walking or climbing stairs? 0  Dressing or bathing? 0  Preparing Food and eating ? N  Using the Toilet? N  In the past six months, have you accidently leaked urine? N  Do you have problems with loss of bowel control? N  Managing your Medications? N  Managing your Finances? N  Housekeeping or managing your Housekeeping? N    Patient Care Team: Tower, Wynelle Fanny, MD as PCP - Faylene Kurtz, MD as Consulting Physician (Ophthalmology) Ralene Bathe, MD as Consulting Physician (Dermatology) Arvella Nigh, MD as Consulting Physician (Obstetrics and Gynecology) Debbora Dus, [facility] as  Pharmacist (Pharmacist)  Indicate any recent Medical Services you may have received from other than [facility] providers in the past year (date may be approximate).     Assessment:   This is a routine wellness examination for Amadi.  Hearing/Vision screen Hearing Screening - Comments:: No hearing difficulty Vision Screening - Comments:: Wears glasses. followed by Dr Valetta Close  Dietary issues and exercise activities discussed: Exercise limited by: None identified   Goals Addressed               This Visit's Progress     Patient stated (pt-stated)        Try to stay healthy       Depression Screen    06/06/2021    2:52 PM 02/17/2020    9:51 AM 02/11/2019    3:29 PM 02/05/2019   10:09 AM 01/30/2018    9:21 AM 01/24/2017   10:57 AM 12/14/2015    9:23 AM  PHQ 2/9 Scores  PHQ - 2 Score 0 0 0 0 0 0 0  PHQ- 9 Score    0 0 0     Fall Risk    06/06/2021    2:55 PM 02/17/2020    9:50 AM 02/11/2019    3:28 PM 02/05/2019   10:09 AM 01/30/2018    9:21 AM  [place] in the past year? 0 0 0 0 0  Number falls in past yr: 0   0   Injury with Fall? 0   0   Risk for fall due to : No Fall Risks   Medication side effect   Follow up  Falls evaluation completed  Falls evaluation completed;Falls prevention discussed     FALL RISK PREVENTION PERTAINING TO THE HOME:  Any stairs in or around the home? No  If so, are there any without handrails? No  Home free of loose throw rugs in walkways, pet beds, electrical cords, etc? Yes  Adequate lighting in your  home to reduce risk of falls? Yes   ASSISTIVE DEVICES UTILIZED TO PREVENT FALLS:  Life alert? No  Use of a cane, walker or w/c? No  Grab bars in the bathroom? No  Shower chair or bench in shower? No  Elevated toilet seat or a handicapped toilet? Yes   TIMED UP AND GO:  Was the test performed? No . Audio Visit  Cognitive Function:    02/05/2019   10:11 AM 01/30/2018    9:22 AM 01/24/2017   10:57 AM 12/14/2015    9:27 AM  MMSE -  Mini Mental State Exam  Orientation to time '5 5 5 5  '$ Orientation to Place '5 5 5 5  '$ Registration '3 3 3 3  '$ Attention/ Calculation 5 0 0 0  Recall '3 3 3 3  '$ Language- name 2 objects  0 0 0  Language- repeat '1 1 1 1  '$ Language- follow 3 step command  '3 3 3  '$ Language- read & follow direction  0 0 0  Write a sentence  0 0 0  Copy design  0 0 0  Total score  '20 20 20        '$ 06/06/2021    2:56 PM  6CIT Screen  What Year? 0 points  What month? 0 points  What time? 0 points  Count back from 20 0 points  Months in reverse 0 points  Repeat phrase 0 points  Total Score 0 points    Immunizations Immunization History  Administered Date(s) Administered   PFIZER(Purple Top)SARS-COV-2 Vaccination 04/04/2019, 04/25/2019, 11/30/2019   Pneumococcal Conjugate-13 02/04/2018   Pneumococcal Polysaccharide-23 02/11/2019   Tdap 06/05/2011    TDAP status: Up to date  Flu Vaccine status: Declined, Education has been provided regarding the importance of this vaccine but patient still declined. Advised may receive this vaccine at local pharmacy or Health Dept. Aware to provide a copy of the vaccination record if obtained from local pharmacy or Health Dept. Verbalized acceptance and understanding.  Pneumococcal vaccine status: Up to date  Covid-19 vaccine status: Completed vaccines  Qualifies for Shingles Vaccine? Yes   Zostavax completed No   Shingrix Completed?: No.    Education has been provided regarding the importance of this vaccine. Patient has been advised to call insurance company to determine out of pocket expense if they have not yet received this vaccine. Advised may also receive vaccine at local pharmacy or Health Dept. Verbalized acceptance and understanding.  Screening Tests Health Maintenance  Topic Date Due   COVID-19 Vaccine (4 - Booster for Pfizer series) 06/22/2021 (Originally 01/25/2020)   Zoster Vaccines- Shingrix (1 of 2) 09/06/2021 (Originally 10/17/1996)   MAMMOGRAM   06/07/2022 (Originally 08/06/2019)   TETANUS/TDAP  06/07/2022 (Originally 06/04/2021)   INFLUENZA VACCINE  12/13/2025 (Originally 08/15/2021)   OPHTHALMOLOGY EXAM  09/08/2021   HEMOGLOBIN A1C  09/11/2021   FOOT EXAM  03/14/2022   COLONOSCOPY (Pts 45-38yr Insurance coverage will need to be confirmed)  06/21/2022   Pneumonia Vaccine 75 Years old  Completed   DEXA SCAN  Completed   Hepatitis C Screening  Completed   HPV VACCINES  Aged Out    Health Maintenance  There are no preventive care reminders to display for this patient.   Colorectal cancer screening: Type of screening: Colonoscopy. Completed 06/20/17. Repeat every 5 years  Mammogram status: Ordered Patient deferred. Pt provided with contact info and advised to call to schedule appt.     Lung Cancer Screening: (Low Dose CT Chest recommended if  Age 76-80 years, 30 pack-year currently smoking OR have quit w/in 15years.) does not qualify.     Additional Screening:  Hepatitis C Screening: does qualify; Completed 12/14/15  Vision Screening: Recommended annual ophthalmology exams for early detection of glaucoma and other disorders of the eye. Is the patient up to date with their annual eye exam?  Yes  Who is the provider or what is the name of the office in which the patient attends annual eye exams? Dr Valetta Close If pt is not established with a provider, would they like to be referred to a provider to establish care? No .   Dental Screening: Recommended annual dental exams for proper oral hygiene  Community Resource Referral / Chronic Care Management:  CRR required this visit?  No   CCM required this visit?  No      Plan:     I have personally reviewed and noted the following in the patient's chart:   Medical and social history Use of alcohol, tobacco or illicit drugs  Current medications and supplements including opioid prescriptions.  Functional ability and status Nutritional status Physical activity Advanced  directives List of other physicians Hospitalizations, surgeries, and ER visits in previous 12 months Vitals Screenings to include cognitive, depression, and falls Referrals and appointments  In addition, I have reviewed and discussed with patient certain preventive protocols, quality metrics, and best practice recommendations. A written personalized care plan for preventive services as well as general preventive health recommendations were provided to patient.     Amy Peaches, LPN   01/31/5788   Nurse Notes: None

## 2021-06-06 NOTE — Patient Instructions (Addendum)
Amy Fry , Thank you for taking time to come for your Medicare Wellness Visit. I appreciate your ongoing commitment to your health goals. Please review the following plan we discussed and let me know if I can assist you in the future.   These are the goals we discussed:  Goals       Patient Stated      Starting 01/30/2018, I will continue to take medications as prescribed.        Patient Stated      02/05/2019, I will maintain and continue medications as prescribed.      Patient stated (pt-stated)      Try to stay healthy        This is a list of the screening recommended for you and due dates:  Health Maintenance  Topic Date Due   COVID-19 Vaccine (4 - Booster for Pfizer series) 06/22/2021*   Zoster (Shingles) Vaccine (1 of 2) 09/06/2021*   Mammogram  06/07/2022*   Tetanus Vaccine  06/07/2022*   Flu Shot  12/13/2025*   Eye exam for diabetics  09/08/2021   Hemoglobin A1C  09/11/2021   Complete foot exam   03/14/2022   Colon Cancer Screening  06/21/2022   Pneumonia Vaccine  Completed   DEXA scan (bone density measurement)  Completed   Hepatitis C Screening: USPSTF Recommendation to screen - Ages 9-79 yo.  Completed   HPV Vaccine  Aged Out  *Topic was postponed. The date shown is not the original due date.   Advanced directives: No  Conditions/risks identified: None  Next appointment: Follow up in one year for your annual wellness visit    Preventive Care 65 Years and Older, Female Preventive care refers to lifestyle choices and visits with your health care provider that can promote health and wellness. What does preventive care include? A yearly physical exam. This is also called an annual well check. Dental exams once or twice a year. Routine eye exams. Ask your health care provider how often you should have your eyes checked. Personal lifestyle choices, including: Daily care of your teeth and gums. Regular physical activity. Eating a healthy diet. Avoiding  tobacco and drug use. Limiting alcohol use. Practicing safe sex. Taking low-dose aspirin every day. Taking vitamin and mineral supplements as recommended by your health care provider. What happens during an annual well check? The services and screenings done by your health care provider during your annual well check will depend on your age, overall health, lifestyle risk factors, and family history of disease. Counseling  Your health care provider may ask you questions about your: Alcohol use. Tobacco use. Drug use. Emotional well-being. Home and relationship well-being. Sexual activity. Eating habits. History of falls. Memory and ability to understand (cognition). Work and work Statistician. Reproductive health. Screening  You may have the following tests or measurements: Height, weight, and BMI. Blood pressure. Lipid and cholesterol levels. These may be checked every 5 years, or more frequently if you are over 85 years old. Skin check. Lung cancer screening. You may have this screening every year starting at age 74 if you have a 30-pack-year history of smoking and currently smoke or have quit within the past 15 years. Fecal occult blood test (FOBT) of the stool. You may have this test every year starting at age 71. Flexible sigmoidoscopy or colonoscopy. You may have a sigmoidoscopy every 5 years or a colonoscopy every 10 years starting at age 44. Hepatitis C blood test. Hepatitis B blood test. Sexually transmitted  disease (STD) testing. Diabetes screening. This is done by checking your blood sugar (glucose) after you have not eaten for a while (fasting). You may have this done every 1-3 years. Bone density scan. This is done to screen for osteoporosis. You may have this done starting at age 69. Mammogram. This may be done every 1-2 years. Talk to your health care provider about how often you should have regular mammograms. Talk with your health care provider about your test  results, treatment options, and if necessary, the need for more tests. Vaccines  Your health care provider may recommend certain vaccines, such as: Influenza vaccine. This is recommended every year. Tetanus, diphtheria, and acellular pertussis (Tdap, Td) vaccine. You may need a Td booster every 10 years. Zoster vaccine. You may need this after age 53. Pneumococcal 13-valent conjugate (PCV13) vaccine. One dose is recommended after age 12. Pneumococcal polysaccharide (PPSV23) vaccine. One dose is recommended after age 53. Talk to your health care provider about which screenings and vaccines you need and how often you need them. This information is not intended to replace advice given to you by your health care provider. Make sure you discuss any questions you have with your health care provider. Document Released: 01/28/2015 Document Revised: 09/21/2015 Document Reviewed: 11/02/2014 Elsevier Interactive Patient Education  2017 [place] Prevention in the Home Falls can cause injuries. They can happen to people of all ages. There are many things you can do to make your home safe and to help prevent falls. What can I do on the outside of my home? Regularly fix the edges of walkways and driveways and fix any cracks. Remove anything that might make you trip as you walk through a door, such as a raised step or threshold. Trim any bushes or trees on the path to your home. Use bright outdoor lighting. Clear any walking paths of anything that might make someone trip, such as rocks or tools. Regularly check to see if handrails are loose or broken. Make sure that both sides of any steps have handrails. Any raised decks and porches should have guardrails on the edges. Have any leaves, snow, or ice cleared regularly. Use sand or salt on walking paths during winter. Clean up any spills in your garage right away. This includes oil or grease spills. What can I do in the bathroom? Use night  lights. Install grab bars by the toilet and in the tub and shower. Do not use towel bars as grab bars. Use non-skid mats or decals in the tub or shower. If you need to sit down in the shower, use a plastic, non-slip stool. Keep the floor dry. Clean up any water that spills on the floor as soon as it happens. Remove soap buildup in the tub or shower regularly. Attach bath mats securely with double-sided non-slip rug tape. Do not have throw rugs and other things on the floor that can make you trip. What can I do in the bedroom? Use night lights. Make sure that you have a light by your bed that is easy to reach. Do not use any sheets or blankets that are too big for your bed. They should not hang down onto the floor. Have a firm chair that has side arms. You can use this for support while you get dressed. Do not have throw rugs and other things on the floor that can make you trip. What can I do in the kitchen? Clean up any spills right away. Avoid walking  on wet floors. Keep items that you use a lot in easy-to-reach places. If you need to reach something above you, use a strong step stool that has a grab bar. Keep electrical cords out of the way. Do not use floor polish or wax that makes floors slippery. If you must use wax, use non-skid floor wax. Do not have throw rugs and other things on the floor that can make you trip. What can I do with my stairs? Do not leave any items on the stairs. Make sure that there are handrails on both sides of the stairs and use them. Fix handrails that are broken or loose. Make sure that handrails are as long as the stairways. Check any carpeting to make sure that it is firmly attached to the stairs. Fix any carpet that is loose or worn. Avoid having throw rugs at the top or bottom of the stairs. If you do have throw rugs, attach them to the floor with carpet tape. Make sure that you have a light switch at the top of the stairs and the bottom of the stairs. If  you do not have them, ask someone to add them for you. What else can I do to help prevent falls? Wear shoes that: Do not have high heels. Have rubber bottoms. Are comfortable and fit you well. Are closed at the toe. Do not wear sandals. If you use a stepladder: Make sure that it is fully opened. Do not climb a closed stepladder. Make sure that both sides of the stepladder are locked into place. Ask someone to hold it for you, if possible. Clearly mark and make sure that you can see: Any grab bars or handrails. First and last steps. Where the edge of each step is. Use tools that help you move around (mobility aids) if they are needed. These include: Canes. Walkers. Scooters. Crutches. Turn on the lights when you go into a dark area. Replace any light bulbs as soon as they burn out. Set up your furniture so you have a clear path. Avoid moving your furniture around. If any of your floors are uneven, fix them. If there are any pets around you, be aware of where they are. Review your medicines with your doctor. Some medicines can make you feel dizzy. This can increase your chance of falling. Ask your doctor what other things that you can do to help prevent falls. This information is not intended to replace advice given to you by your health care provider. Make sure you discuss any questions you have with your health care provider. Document Released: 10/28/2008 Document Revised: 06/09/2015 Document Reviewed: 02/05/2014 Elsevier Interactive Patient Education  2017 [employer].

## 2021-09-03 ENCOUNTER — Telehealth: Payer: Self-pay | Admitting: Family Medicine

## 2021-09-03 DIAGNOSIS — I10 Essential (primary) hypertension: Secondary | ICD-10-CM

## 2021-09-03 DIAGNOSIS — M810 Age-related osteoporosis without current pathological fracture: Secondary | ICD-10-CM

## 2021-09-03 DIAGNOSIS — E119 Type 2 diabetes mellitus without complications: Secondary | ICD-10-CM

## 2021-09-03 DIAGNOSIS — E559 Vitamin D deficiency, unspecified: Secondary | ICD-10-CM

## 2021-09-03 DIAGNOSIS — E1169 Type 2 diabetes mellitus with other specified complication: Secondary | ICD-10-CM

## 2021-09-03 NOTE — Telephone Encounter (Signed)
-----   Message from Ellamae Sia sent at 08/24/2021  3:41 PM EDT ----- Regarding: Lab orders for Tuesday, 8.22.23 Patient is scheduled for CPX labs, please order future labs, Thanks , Karna Christmas

## 2021-09-05 ENCOUNTER — Other Ambulatory Visit (INDEPENDENT_AMBULATORY_CARE_PROVIDER_SITE_OTHER): Payer: PPO

## 2021-09-05 DIAGNOSIS — E559 Vitamin D deficiency, unspecified: Secondary | ICD-10-CM | POA: Diagnosis not present

## 2021-09-05 DIAGNOSIS — E785 Hyperlipidemia, unspecified: Secondary | ICD-10-CM

## 2021-09-05 DIAGNOSIS — E1169 Type 2 diabetes mellitus with other specified complication: Secondary | ICD-10-CM

## 2021-09-05 DIAGNOSIS — I10 Essential (primary) hypertension: Secondary | ICD-10-CM | POA: Diagnosis not present

## 2021-09-05 DIAGNOSIS — E119 Type 2 diabetes mellitus without complications: Secondary | ICD-10-CM

## 2021-09-05 LAB — CBC WITH DIFFERENTIAL/PLATELET
Basophils Absolute: 0 10*3/uL (ref 0.0–0.1)
Basophils Relative: 1.2 % (ref 0.0–3.0)
Eosinophils Absolute: 0.1 10*3/uL (ref 0.0–0.7)
Eosinophils Relative: 2.8 % (ref 0.0–5.0)
HCT: 35.9 % — ABNORMAL LOW (ref 36.0–46.0)
Hemoglobin: 12.3 g/dL (ref 12.0–15.0)
Lymphocytes Relative: 47.4 % — ABNORMAL HIGH (ref 12.0–46.0)
Lymphs Abs: 1.9 10*3/uL (ref 0.7–4.0)
MCHC: 34.3 g/dL (ref 30.0–36.0)
MCV: 90.1 fl (ref 78.0–100.0)
Monocytes Absolute: 0.3 10*3/uL (ref 0.1–1.0)
Monocytes Relative: 8.8 % (ref 3.0–12.0)
Neutro Abs: 1.6 10*3/uL (ref 1.4–7.7)
Neutrophils Relative %: 39.8 % — ABNORMAL LOW (ref 43.0–77.0)
Platelets: 277 10*3/uL (ref 150.0–400.0)
RBC: 3.99 Mil/uL (ref 3.87–5.11)
RDW: 13.5 % (ref 11.5–15.5)
WBC: 3.9 10*3/uL — ABNORMAL LOW (ref 4.0–10.5)

## 2021-09-05 LAB — COMPREHENSIVE METABOLIC PANEL
ALT: 12 U/L (ref 0–35)
AST: 16 U/L (ref 0–37)
Albumin: 4.4 g/dL (ref 3.5–5.2)
Alkaline Phosphatase: 61 U/L (ref 39–117)
BUN: 22 mg/dL (ref 6–23)
CO2: 27 mEq/L (ref 19–32)
Calcium: 9.7 mg/dL (ref 8.4–10.5)
Chloride: 105 mEq/L (ref 96–112)
Creatinine, Ser: 0.83 mg/dL (ref 0.40–1.20)
GFR: 69.22 mL/min (ref >60.00)
Glucose, Bld: 100 mg/dL — ABNORMAL HIGH (ref 70–99)
Potassium: 4.8 mEq/L (ref 3.5–5.1)
Sodium: 138 mEq/L (ref 135–145)
Total Bilirubin: 0.4 mg/dL (ref 0.2–1.2)
Total Protein: 6.7 g/dL (ref 6.0–8.3)

## 2021-09-05 LAB — HEMOGLOBIN A1C: Hgb A1c MFr Bld: 6.3 % (ref 4.6–6.5)

## 2021-09-05 LAB — LIPID PANEL
Cholesterol: 169 mg/dL (ref 0–200)
HDL: 60 mg/dL (ref >39.00)
LDL Cholesterol: 92 mg/dL (ref 0–99)
NonHDL: 109.46
Total CHOL/HDL Ratio: 3
Triglycerides: 86 mg/dL (ref 0.0–149.0)
VLDL: 17.2 mg/dL (ref 0.0–40.0)

## 2021-09-05 LAB — VITAMIN D 25 HYDROXY (VIT D DEFICIENCY, FRACTURES): VITD: 61.29 ng/mL (ref 30.00–100.00)

## 2021-09-05 LAB — TSH: TSH: 1.1 u[IU]/mL (ref 0.35–5.50)

## 2021-09-12 ENCOUNTER — Encounter: Payer: Self-pay | Admitting: Family Medicine

## 2021-09-12 ENCOUNTER — Ambulatory Visit (INDEPENDENT_AMBULATORY_CARE_PROVIDER_SITE_OTHER): Payer: PPO | Admitting: Family Medicine

## 2021-09-12 VITALS — BP 130/78 | HR 67 | Temp 97.6°F | Ht 62.25 in | Wt 162.0 lb

## 2021-09-12 DIAGNOSIS — L989 Disorder of the skin and subcutaneous tissue, unspecified: Secondary | ICD-10-CM | POA: Insufficient documentation

## 2021-09-12 DIAGNOSIS — E1169 Type 2 diabetes mellitus with other specified complication: Secondary | ICD-10-CM | POA: Diagnosis not present

## 2021-09-12 DIAGNOSIS — Z0000 Encounter for general adult medical examination without abnormal findings: Secondary | ICD-10-CM | POA: Diagnosis not present

## 2021-09-12 DIAGNOSIS — I10 Essential (primary) hypertension: Secondary | ICD-10-CM | POA: Diagnosis not present

## 2021-09-12 DIAGNOSIS — E559 Vitamin D deficiency, unspecified: Secondary | ICD-10-CM

## 2021-09-12 DIAGNOSIS — E785 Hyperlipidemia, unspecified: Secondary | ICD-10-CM | POA: Diagnosis not present

## 2021-09-12 DIAGNOSIS — M810 Age-related osteoporosis without current pathological fracture: Secondary | ICD-10-CM | POA: Diagnosis not present

## 2021-09-12 DIAGNOSIS — E119 Type 2 diabetes mellitus without complications: Secondary | ICD-10-CM | POA: Diagnosis not present

## 2021-09-12 NOTE — Assessment & Plan Note (Signed)
Level of 69 Vitamin D level is therapeutic with current supplementation Disc importance of this to bone and overall health

## 2021-09-12 NOTE — Progress Notes (Signed)
   Subjective:    Patient ID: Amy Fry, female    DOB: 07/25/1945, 76 y.o.   MRN: [id number]  HPI    Review of Systems     Objective:   Physical Exam        Assessment & Plan:

## 2021-09-12 NOTE — Assessment & Plan Note (Signed)
Disc goals for lipids and reasons to control them Rev last labs with pt Rev low sat fat diet in detail  Plan to continue lovastatin 20 mg daily and gemfibrozil 600 mg bid  Would like go get LDL under 70  Does not tolerate other statins

## 2021-09-12 NOTE — Patient Instructions (Addendum)
Get your eye exam Thursday and have them send [place] a report  Have gyn send [place] mammogram and bone density test reports   Cut your fried foods if you can to get your cholesterol down more  Avoid red meat/ fried foods/ egg yolks/ fatty breakfast meats/ butter, cheese and high fat dairy/ and shellfish    Also less carbs Try to get most of your carbohydrates from produce (with the exception of white potatoes)  Eat less bread/pasta/rice/snack foods/cereals/sweets and other items from the middle of the grocery store (processed carbs)  Follow up in 6 months  I placed a dermatology order  You can call Dr Alveria Apley office or you will get a call   Take care of yourself

## 2021-09-12 NOTE — Assessment & Plan Note (Signed)
dexa 2019 On boniva  Followed by gyn  No falls or fx  Disc need for calcium/ vitamin D/ wt bearing exercise and bone density test every 2 y to monitor Disc safety/ fracture risk in detail  Planning dexa in nov at gyn

## 2021-09-12 NOTE — Assessment & Plan Note (Signed)
Reviewed health habits including diet and exercise and skin cancer prevention Reviewed appropriate screening tests for age  Also reviewed health mt list, fam hx and immunization status , as well as social and family history   See HPI Labs reviewed  Declines shingrix and flu shots  Mammogram-at gyn and utd in 11/2020 dexa 2019 reviewed/no falls or fx taking boniva Enc exercise  Colonoscopy utd 06/2017 with 5 y recall

## 2021-09-12 NOTE — Assessment & Plan Note (Signed)
Keratotic/warty Ref to derm for eval and likely removal

## 2021-09-12 NOTE — Progress Notes (Signed)
Subjective:    Patient ID: Amy Fry, female    DOB: 12/23/1945, 76 y.o.   MRN: [id number]  HPI Here for health maintenance exam and to review chronic medical problems    Wt Readings from Last 3 Encounters:  09/12/21 162 lb (73.5 kg)  06/06/21 159 lb (72.1 kg)  03/14/21 159 lb 8 oz (72.3 kg)   29.39 kg/m  Taking care of herself  Did travel a bit this summer and will go to beach next month  Feels fine   Immunization History  Administered Date(s) Administered   PFIZER(Purple Top)SARS-COV-2 Vaccination 04/04/2019, 04/25/2019, 11/30/2019   Pneumococcal Conjugate-13 02/04/2018   Pneumococcal Polysaccharide-23 02/11/2019   Tdap 06/05/2011   Health Maintenance Due  Topic Date Due   Zoster Vaccines- Shingrix (1 of 2) Never done   COVID-19 Vaccine (4 - Pfizer series) 01/25/2020   OPHTHALMOLOGY EXAM  09/08/2021   Shingrix: declines  Flu shot : declines   Mammogram: - gets that at gyn , Dr Radene Knee  Last one nl in nov Self breast exam: no lumps   Gyn visit 11/2020  Goes back in nov  Dexa  08/2017  at [facility] - does well with it / once monthly  OP  Falls: none Fractures: none  Supplements : ca and D  D level is 69.2 Exercise : uses a pedaler every day and likes it  Some walking regular as well    Colonoscopy 06/2017 with 5 y recall    HTN bp is stable today  No cp or palpitations or headaches or edema  No side effects to medicines  BP Readings from Last 3 Encounters:  09/12/21 130/78  03/14/21 124/72  08/18/20 135/80     Lisinopril 5 mg daily  Spironolactone 12.5 mg daily   Lab Results  Component Value Date   CREATININE 0.83 09/05/2021   BUN 22 09/05/2021   NA 138 09/05/2021   K 4.8 09/05/2021   CL 105 09/05/2021   CO2 27 09/05/2021   DM2 Lab Results  Component Value Date   HGBA1C 6.3 09/05/2021   This is up from 5.6  Metformin 1000 mg bid -no problems  Eye exam due - has it scheduled on Thursday   Avoids sweets  Occ art sweetener    No change in vision     Hyperlipidemia Lab Results  Component Value Date   CHOL 169 09/05/2021   CHOL 168 08/11/2020   CHOL 152 02/10/2020   Lab Results  Component Value Date   HDL 60.00 09/05/2021   HDL 60.90 08/11/2020   HDL 52.40 02/10/2020   Lab Results  Component Value Date   LDLCALC 92 09/05/2021   LDLCALC 80 08/11/2020   LDLCALC 79 02/10/2020   Lab Results  Component Value Date   TRIG 86.0 09/05/2021   TRIG 132.0 08/11/2020   TRIG 103.0 02/10/2020   Lab Results  Component Value Date   CHOLHDL 3 09/05/2021   CHOLHDL 3 08/11/2020   CHOLHDL 3 02/10/2020   No results found for: "LDLDIRECT" Lovastatin 20 mg daily  Gemfibrozil 600 mg bid   Diet  Not much red meat  Some fried foods   Lab Results  Component Value Date   ALT 12 09/05/2021   AST 16 09/05/2021   ALKPHOS 61 09/05/2021   BILITOT 0.4 09/05/2021    Patient Active Problem List   Diagnosis Date Noted   Skin lesion of left arm 09/12/2021   Need for hepatitis C screening test 12/13/2015  Obesity 12/11/2014   Herpes simplex 03/16/2014   Pedal edema 06/15/2013   History of fracture of arm 06/09/2013   Encounter for Medicare annual wellness exam 06/17/2012   Routine general medical examination at a health care facility 02/18/2011   Vitamin D deficiency 07/30/2008   DERMATOPHYTOSIS, NAIL 07/26/2006   Diabetes type 2, controlled ([place]) 07/25/2006   Hyperlipidemia associated with type 2 diabetes mellitus ([place]) 07/25/2006   Essential hypertension 07/25/2006   Osteoporosis 07/25/2006   Past Medical History:  Diagnosis Date   Arthritis    Diabetes mellitus    type II   Early cataracts, bilateral    no sx yet    Hyperlipidemia    Hypertension    Osteopenia    Past Surgical History:  Procedure Laterality Date   CYSTOSCOPY  10-1999   FRACTURE SURGERY  2010   rib fx- from fall    LAPAROSCOPIC CHOLECYSTECTOMY     OOPHORECTOMY     ORIF HUMERUS FRACTURE Left 06/09/2013   Procedure: OPEN  REDUCTION INTERNAL FIXATION (ORIF) PROXIMAL HUMERUS FRACTURE;  Surgeon: Rozanna Box, MD;  Location: [facility];  Service: Orthopedics;  Laterality: Left;   Social History   Tobacco Use   Smoking status: Former   Smokeless tobacco: Never   Tobacco comments:    a little as a teenager  Vaping Use   Vaping Use: Never used  Substance Use Topics   Alcohol use: No    Alcohol/week: 0.0 standard drinks of alcohol   Drug use: No   Family History  Problem Relation Age of Onset   Heart attack Mother    Heart attack Brother    Stroke Brother    Depression Sister    Colon cancer Neg Hx    Stomach cancer Neg Hx    Esophageal cancer Neg Hx    Rectal cancer Neg Hx    Allergies  Allergen Reactions   Alendronate Sodium     REACTION: reaction not known   Atorvastatin     REACTION: headaches   Pravastatin Sodium     REACTION: headaches and arm pain   Current Outpatient Medications on File Prior to Visit  Medication Sig Dispense Refill   Ascorbic Acid (VITAMIN C) 1000 MG tablet Take 500 mg by mouth daily.     aspirin 81 MG EC tablet Take 81 mg by mouth daily.     Calcium Carbonate-Vit D-Min (CALCIUM 1200 PO) Take 1 tablet by mouth 2 (two) times daily.     Cholecalciferol (VITAMIN D) 2000 UNITS CAPS Take 2,000 Units by mouth daily.      CRANBERRY PO Take 4,200 Units by mouth daily.     gemfibrozil (LOPID) 600 MG tablet TAKE 1 TABLET BY MOUTH TWICE (2) DAILY 180 tablet 3   ibandronate (BONIVA) 150 MG tablet      lisinopril (ZESTRIL) 5 MG tablet Take 1 tablet (5 mg total) by mouth daily. 90 tablet 3   lovastatin (MEVACOR) 20 MG tablet Take 1 tablet (20 mg total) by mouth daily. 90 tablet 3   metFORMIN (GLUCOPHAGE) 1000 MG tablet TAKE 1 TABLET BY MOUTH TWICE (2) DAILY WITH A MEAL 180 tablet 3   Omega-3 300 MG CAPS Take 300 mg by mouth 2 (two) times daily.      spironolactone (ALDACTONE) 25 MG tablet Take 0.5 tablets (12.5 mg total) by mouth daily. 45 tablet 3   vitamin B-12 (CYANOCOBALAMIN)  1000 MCG tablet Take 1,000 mcg by mouth daily.     No current  facility-administered medications on file prior to visit.     Review of Systems  Constitutional:  Negative for activity change, appetite change, fatigue, fever and unexpected weight change.  HENT:  Negative for congestion, ear pain, rhinorrhea, sinus pressure and sore throat.   Eyes:  Negative for pain, redness and visual disturbance.  Respiratory:  Negative for cough, shortness of breath and wheezing.   Cardiovascular:  Negative for chest pain and palpitations.  Gastrointestinal:  Negative for abdominal pain, blood in stool, constipation and diarrhea.  Endocrine: Negative for polydipsia and polyuria.  Genitourinary:  Negative for dysuria, frequency and urgency.  Musculoskeletal:  Negative for arthralgias, back pain and myalgias.  Skin:  Negative for pallor and rash.  Allergic/Immunologic: Negative for environmental allergies.  Neurological:  Negative for dizziness, syncope and headaches.  Hematological:  Negative for adenopathy. Does not bruise/bleed easily.  Psychiatric/Behavioral:  Negative for decreased concentration and dysphoric mood. The patient is not nervous/anxious.        Objective:   Physical Exam Constitutional:      General: She is not in acute distress.    Appearance: Normal appearance. She is well-developed. She is obese. She is not ill-appearing or diaphoretic.  HENT:     Head: Normocephalic and atraumatic.     Right Ear: Tympanic membrane, ear canal and external ear normal.     Left Ear: Tympanic membrane, ear canal and external ear normal.     Nose: Nose normal. No congestion.     Mouth/Throat:     Mouth: Mucous membranes are moist.     Pharynx: Oropharynx is clear. No posterior oropharyngeal erythema.  Eyes:     General: No scleral icterus.    Extraocular Movements: Extraocular movements intact.     Conjunctiva/sclera: Conjunctivae normal.     Pupils: Pupils are equal, round, and reactive to  light.  Neck:     Thyroid: No thyromegaly.     Vascular: No carotid bruit or JVD.  Cardiovascular:     Rate and Rhythm: Normal rate and regular rhythm.     Pulses: Normal pulses.     Heart sounds: Normal heart sounds.     No gallop.  Pulmonary:     Effort: Pulmonary effort is normal. No respiratory distress.     Breath sounds: Normal breath sounds. No wheezing.     Comments: Good air exch Chest:     Chest wall: No tenderness.  Abdominal:     General: Bowel sounds are normal. There is no distension or abdominal bruit.     Palpations: Abdomen is soft. There is no mass.     Tenderness: There is no abdominal tenderness.     Hernia: No hernia is present.  Genitourinary:    Comments: Breast exam: No mass, nodules, thickening, tenderness, bulging, retraction, inflamation, nipple discharge or skin changes noted.  No axillary or clavicular LA.     Musculoskeletal:        General: No tenderness. Normal range of motion.     Cervical back: Normal range of motion and neck supple. No rigidity. No muscular tenderness.     Right lower leg: No edema.     Left lower leg: No edema.     Comments: No kyphosis   Lymphadenopathy:     Cervical: No cervical adenopathy.  Skin:    General: Skin is warm and dry.     Coloration: Skin is not pale.     Findings: No erythema or rash.     Comments: 4 mm  keratotic lesion on L upper arm- is rough and raised and pale in color  Solar lentigines diffusely   Neurological:     Mental Status: She is alert. Mental status is at baseline.     Cranial Nerves: No cranial nerve deficit.     Motor: No abnormal muscle tone.     Coordination: Coordination normal.     Gait: Gait normal.     Deep Tendon Reflexes: Reflexes are normal and symmetric. Reflexes normal.  Psychiatric:        Mood and Affect: Mood normal.        Cognition and Memory: Cognition and memory normal.           Assessment & Plan:   Problem List Items Addressed This Visit        Cardiovascular and Mediastinum   Essential hypertension    bp in fair control at this time  BP Readings from Last 1 Encounters:  09/12/21 130/78  No changes needed Most recent labs reviewed  Disc lifstyle change with low sodium diet and exercise  Plan to continue  Lisinopril 5 mg daily  Spironolactone 12.5 mg daily          Endocrine   Diabetes type 2, controlled ([place])    Lab Results  Component Value Date   HGBA1C 6.3 09/05/2021  Well controlled Continue metformin 1000 mg bid and good diet  Eye exam is this week  Nl foot exam On ace and statin  F/u 6 mo      Hyperlipidemia associated with type 2 diabetes mellitus ([place])    Disc goals for lipids and reasons to control them Rev last labs with pt Rev low sat fat diet in detail  Plan to continue lovastatin 20 mg daily and gemfibrozil 600 mg bid  Would like go get LDL under 70  Does not tolerate other statins         Musculoskeletal and Integument   Osteoporosis    dexa 2019 On boniva  Followed by gyn  No falls or fx  Disc need for calcium/ vitamin D/ wt bearing exercise and bone density test every 2 y to monitor Disc safety/ fracture risk in detail  Planning dexa in nov at gyn       Skin lesion of left arm    Keratotic/warty Ref to derm for eval and likely removal      Relevant Orders   Ambulatory referral to Dermatology     Other   Routine general medical examination at a health care facility - Primary    Reviewed health habits including diet and exercise and skin cancer prevention Reviewed appropriate screening tests for age  Also reviewed health mt list, fam hx and immunization status , as well as social and family history   See HPI Labs reviewed  Declines shingrix and flu shots  Mammogram-at gyn and utd in 11/2020 dexa 2019 reviewed/no falls or fx taking boniva Enc exercise  Colonoscopy utd 06/2017 with 5 y recall        Vitamin D deficiency    Level of 23 Vitamin D level is therapeutic with  current supplementation Disc importance of this to bone and overall health

## 2021-09-12 NOTE — Assessment & Plan Note (Signed)
Lab Results  Component Value Date   HGBA1C 6.3 09/05/2021   Well controlled Continue metformin 1000 mg bid and good diet  Eye exam is this week  Nl foot exam On ace and statin  F/u 6 mo

## 2021-09-12 NOTE — Assessment & Plan Note (Signed)
bp in fair control at this time  BP Readings from Last 1 Encounters:  09/12/21 130/78   No changes needed Most recent labs reviewed  Disc lifstyle change with low sodium diet and exercise  Plan to continue  Lisinopril 5 mg daily  Spironolactone 12.5 mg daily

## 2021-09-14 DIAGNOSIS — E119 Type 2 diabetes mellitus without complications: Secondary | ICD-10-CM | POA: Diagnosis not present

## 2021-09-14 DIAGNOSIS — H524 Presbyopia: Secondary | ICD-10-CM | POA: Diagnosis not present

## 2021-09-14 DIAGNOSIS — H2513 Age-related nuclear cataract, bilateral: Secondary | ICD-10-CM | POA: Diagnosis not present

## 2021-09-14 LAB — HM DIABETES EYE EXAM

## 2021-10-03 ENCOUNTER — Encounter: Payer: Self-pay | Admitting: Family Medicine

## 2021-11-22 ENCOUNTER — Ambulatory Visit: Payer: PPO | Admitting: Dermatology

## 2021-11-22 ENCOUNTER — Encounter: Payer: Self-pay | Admitting: Dermatology

## 2021-11-22 DIAGNOSIS — Z85828 Personal history of other malignant neoplasm of skin: Secondary | ICD-10-CM

## 2021-11-22 DIAGNOSIS — B079 Viral wart, unspecified: Secondary | ICD-10-CM

## 2021-11-22 DIAGNOSIS — L578 Other skin changes due to chronic exposure to nonionizing radiation: Secondary | ICD-10-CM

## 2021-11-22 DIAGNOSIS — D492 Neoplasm of unspecified behavior of bone, soft tissue, and skin: Secondary | ICD-10-CM

## 2021-11-22 NOTE — Patient Instructions (Signed)
Wound Care Instructions  Cleanse wound gently with soap and water once a day then pat dry with clean gauze. Apply a thin coat of Petrolatum (petroleum jelly, "Vaseline") over the wound (unless you have an allergy to this). We recommend that you use a new, sterile tube of Vaseline. Do not pick or remove scabs. Do not remove the yellow or white "healing tissue" from the base of the wound.  Cover the wound with fresh, clean, nonstick gauze and secure with paper tape. You may use Band-Aids in place of gauze and tape if the wound is small enough, but would recommend trimming much of the tape off as there is often too much. Sometimes Band-Aids can irritate the skin.  You should call the office for your biopsy report after 1 week if you have not already been contacted.  If you experience any problems, such as abnormal amounts of bleeding, swelling, significant bruising, significant pain, or evidence of infection, please call the office immediately.  FOR ADULT SURGERY PATIENTS: If you need something for pain relief you may take 1 extra strength Tylenol (acetaminophen) AND 2 Ibuprofen (200mg each) together every 4 hours as needed for pain. (do not take these if you are allergic to them or if you have a reason you should not take them.) Typically, you may only need pain medication for 1 to 3 days.     Due to recent changes in healthcare laws, you may see results of your pathology and/or laboratory studies on MyChart before the doctors have had a chance to review them. We understand that in some cases there may be results that are confusing or concerning to you. Please understand that not all results are received at the same time and often the doctors may need to interpret multiple results in order to provide you with the best plan of care or course of treatment. Therefore, we ask that you please give us 2 business days to thoroughly review all your results before contacting the office for clarification. Should  we see a critical lab result, you will be contacted sooner.   If You Need Anything After Your Visit  If you have any questions or concerns for your doctor, please call our main line at [phone] and press option 4 to reach your doctor's medical assistant. If no one answers, please leave a voicemail as directed and we will return your call as soon as possible. Messages left after 4 pm will be answered the following business day.   You may also send us a message via MyChart. We typically respond to MyChart messages within 1-2 business days.  For prescription refills, please ask your pharmacy to contact our office. Our fax number is [phone].  If you have an urgent issue when the clinic is closed that cannot wait until the next business day, you can page your doctor at the number below.    Please note that while we do our best to be available for urgent issues outside of office hours, we are not available 24/7.   If you have an urgent issue and are unable to reach us, you may choose to seek medical care at your doctor's office, retail clinic, urgent care center, or emergency room.  If you have a medical emergency, please immediately call 911 or go to the emergency department.  Pager Numbers  - Dr. Kowalski: [phone]  - Dr. Moye: [phone]  - Dr. Stewart: [phone]  In the event of inclement weather, please call our main line at   [phone] for an update on the status of any delays or closures.  Dermatology Medication Tips: Please keep the boxes that topical medications come in in order to help keep track of the instructions about where and how to use these. Pharmacies typically print the medication instructions only on the boxes and not directly on the medication tubes.   If your medication is too expensive, please contact our office at [phone] option 4 or send us a message through MyChart.   We are unable to tell what your co-pay for medications will be in  advance as this is different depending on your insurance coverage. However, we may be able to find a substitute medication at lower cost or fill out paperwork to get insurance to cover a needed medication.   If a prior authorization is required to get your medication covered by your insurance company, please allow us 1-2 business days to complete this process.  Drug prices often vary depending on where the prescription is filled and some pharmacies may offer cheaper prices.  The website www.goodrx.com contains coupons for medications through different pharmacies. The prices here do not account for what the cost may be with help from insurance (it may be cheaper with your insurance), but the website can give you the price if you did not use any insurance.  - You can print the associated coupon and take it with your prescription to the pharmacy.  - You may also stop by our office during regular business hours and pick up a GoodRx coupon card.  - If you need your prescription sent electronically to a different pharmacy, notify our office through [facility] MyChart or by phone at [phone] option 4.     Si Usted Necesita Algo Despus de Su Visita  Tambin puede enviarnos un mensaje a travs de MyChart. Por lo general respondemos a los mensajes de MyChart en el transcurso de 1 a 2 das hbiles.  Para renovar recetas, por favor pida a su farmacia que se ponga en contacto con nuestra oficina. Nuestro nmero de fax es el [phone].  Si tiene un asunto urgente cuando la clnica est cerrada y que no puede esperar hasta el siguiente da hbil, puede llamar/localizar a su doctor(a) al nmero que aparece a continuacin.   Por favor, tenga en cuenta que aunque hacemos todo lo posible para estar disponibles para asuntos urgentes fuera del horario de oficina, no estamos disponibles las 24 horas del da, los 7 das de la semana.   Si tiene un problema urgente y no puede comunicarse con nosotros, puede  optar por buscar atencin mdica  en el consultorio de su doctor(a), en una clnica privada, en un centro de atencin urgente o en una sala de emergencias.  Si tiene una emergencia mdica, por favor llame inmediatamente al 911 o vaya a la sala de emergencias.  Nmeros de bper  - Dr. Kowalski: [phone]  - Dra. Moye: [phone]  - Dra. Stewart: [phone]  En caso de inclemencias del tiempo, por favor llame a nuestra lnea principal al [phone] para una actualizacin sobre el estado de cualquier retraso o cierre.  Consejos para la medicacin en dermatologa: Por favor, guarde las cajas en las que vienen los medicamentos de uso tpico para ayudarle a seguir las instrucciones sobre dnde y cmo usarlos. Las farmacias generalmente imprimen las instrucciones del medicamento slo en las cajas y no directamente en los tubos del medicamento.   Si su medicamento es muy caro, por favor, pngase en contacto con   nuestra oficina llamando al [phone] y presione la opcin 4 o envenos un mensaje a travs de MyChart.   No podemos decirle cul ser su copago por los medicamentos por adelantado ya que esto es diferente dependiendo de la cobertura de su seguro. Sin embargo, es posible que podamos encontrar un medicamento sustituto a menor costo o llenar un formulario para que el seguro cubra el medicamento que se considera necesario.   Si se requiere una autorizacin previa para que su compaa de seguros cubra su medicamento, por favor permtanos de 1 a 2 das hbiles para completar este proceso.  Los precios de los medicamentos varan con frecuencia dependiendo del lugar de dnde se surte la receta y alguna farmacias pueden ofrecer precios ms baratos.  El sitio web www.goodrx.com tiene cupones para medicamentos de diferentes farmacias. Los precios aqu no tienen en cuenta lo que podra costar con la ayuda del seguro (puede ser ms barato con su seguro), pero el sitio web puede darle el  precio si no utiliz ningn seguro.  - Puede imprimir el cupn correspondiente y llevarlo con su receta a la farmacia.  - Tambin puede pasar por nuestra oficina durante el horario de atencin regular y recoger una tarjeta de cupones de GoodRx.  - Si necesita que su receta se enve electrnicamente a una farmacia diferente, informe a nuestra oficina a travs de MyChart de [place] o por telfono llamando al [phone] y presione la opcin 4.  

## 2021-11-22 NOTE — Progress Notes (Signed)
   New Patient Visit  Subjective  Amy Fry is a 76 y.o. female who presents for the following: lesion (Left arm. Dur: couple of years. Hx of surgery on left arm. This lesion is over incision site. Raised, rough).  The patient has spots, moles and lesions to be evaluated, some may be new or changing and the patient has concerns that these could be cancer.   Review of Systems: No other skin or systemic complaints except as noted in HPI or Assessment and Plan.   Objective  Well appearing patient in no apparent distress; mood and affect are within normal limits.  A focused examination was performed including left arm. Relevant physical exam findings are noted in the Assessment and Plan.  Left Upper Arm 36m keratotic papule        Assessment & Plan  Neoplasm of skin Left Upper Arm  Epidermal / dermal shaving  Lesion diameter (cm):  0.6 Informed consent: discussed and consent obtained   Patient was prepped and draped in usual sterile fashion: Area prepped with alcohol. Anesthesia: the lesion was anesthetized in a standard fashion   Anesthetic:  1% lidocaine w/ epinephrine 1-100,000 buffered w/ 8.4% NaHCO3 Instrument used: flexible razor blade   Hemostasis achieved with: pressure, aluminum chloride and electrodesiccation   Outcome: patient tolerated procedure well   Post-procedure details: wound care instructions given   Post-procedure details comment:  Ointment and small bandage applied  Destruction of lesion  Destruction method: electrodesiccation and curettage   Timeout:  patient name, date of birth, surgical site, and procedure verified Anesthesia: the lesion was anesthetized in a standard fashion   Anesthetic:  1% lidocaine w/ epinephrine 1-100,000 buffered w/ 8.4% NaHCO3 Curettage performed in three different directions: Yes   Electrodesiccation performed over the curetted area: Yes   Curettage cycles:  3 Final wound size (cm):  0.6 Hemostasis achieved  with:  pressure, aluminum chloride and electrodesiccation Outcome: patient tolerated procedure well with no complications   Post-procedure details: wound care instructions given    Specimen 1 - Surgical pathology Differential Diagnosis: wart vs ISK, R/O SCC  Check Margins: No    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 Superficial. 06/09/2015. - No evidence of recurrence today - Recommend regular full body skin exams - Recommend daily broad spectrum sunscreen SPF 30+ to sun-exposed areas, reapply every 2 hours as needed.  - Call if any new or changing lesions are noted between office visits   Actinic Damage - chronic, secondary to cumulative UV radiation exposure/sun exposure over time - diffuse scaly erythematous macules with underlying dyspigmentation - Recommend daily broad spectrum sunscreen SPF 30+ to sun-exposed areas, reapply every 2 hours as needed.  - Recommend staying in the shade or wearing long sleeves, sun glasses (UVA+UVB protection) and wide brim hats (4-inch brim around the entire circumference of the hat). - Call for new or changing lesions.   Return for TBSE, Next Available.  I, JEmelia Salisbury CMA, am acting as scribe for TBrendolyn Patty MD.  Documentation: I have reviewed the above documentation for accuracy and completeness, and I agree with the above.  TBrendolyn PattyMD

## 2021-11-28 ENCOUNTER — Telehealth: Payer: Self-pay

## 2021-11-28 NOTE — Telephone Encounter (Signed)
-----   Message from Ralene Bathe, MD sent at 11/27/2021  6:16 PM EST ----- Diagnosis Skin , left upper arm VERRUCA VULGARIS, IRRITATED  Benign irritated viral wart May recur No further treatment at this time

## 2021-11-28 NOTE — Telephone Encounter (Signed)
Left pt message to call for bx result/sh 

## 2021-11-28 NOTE — Telephone Encounter (Signed)
Patient notified of bx results  

## 2021-12-14 DIAGNOSIS — R29890 Loss of height: Secondary | ICD-10-CM | POA: Diagnosis not present

## 2021-12-14 DIAGNOSIS — Z7983 Long term (current) use of bisphosphonates: Secondary | ICD-10-CM | POA: Diagnosis not present

## 2021-12-14 DIAGNOSIS — Z01419 Encounter for gynecological examination (general) (routine) without abnormal findings: Secondary | ICD-10-CM | POA: Diagnosis not present

## 2021-12-14 DIAGNOSIS — Z6828 Body mass index (BMI) 28.0-28.9, adult: Secondary | ICD-10-CM | POA: Diagnosis not present

## 2021-12-14 DIAGNOSIS — M816 Localized osteoporosis [Lequesne]: Secondary | ICD-10-CM | POA: Diagnosis not present

## 2021-12-14 DIAGNOSIS — N958 Other specified menopausal and perimenopausal disorders: Secondary | ICD-10-CM | POA: Diagnosis not present

## 2021-12-14 DIAGNOSIS — Z1231 Encounter for screening mammogram for malignant neoplasm of breast: Secondary | ICD-10-CM | POA: Diagnosis not present

## 2022-03-05 DIAGNOSIS — M25561 Pain in right knee: Secondary | ICD-10-CM | POA: Diagnosis not present

## 2022-03-15 ENCOUNTER — Ambulatory Visit (INDEPENDENT_AMBULATORY_CARE_PROVIDER_SITE_OTHER): Payer: HMO | Admitting: Family Medicine

## 2022-03-15 ENCOUNTER — Encounter: Payer: Self-pay | Admitting: Family Medicine

## 2022-03-15 VITALS — BP 130/70 | HR 76 | Temp 97.3°F | Ht 62.25 in | Wt 158.2 lb

## 2022-03-15 DIAGNOSIS — E785 Hyperlipidemia, unspecified: Secondary | ICD-10-CM

## 2022-03-15 DIAGNOSIS — M810 Age-related osteoporosis without current pathological fracture: Secondary | ICD-10-CM | POA: Diagnosis not present

## 2022-03-15 DIAGNOSIS — E1169 Type 2 diabetes mellitus with other specified complication: Secondary | ICD-10-CM | POA: Diagnosis not present

## 2022-03-15 DIAGNOSIS — E119 Type 2 diabetes mellitus without complications: Secondary | ICD-10-CM

## 2022-03-15 DIAGNOSIS — I10 Essential (primary) hypertension: Secondary | ICD-10-CM | POA: Diagnosis not present

## 2022-03-15 LAB — MICROALBUMIN / CREATININE URINE RATIO
Creatinine,U: 22.5 mg/dL
Microalb Creat Ratio: 3.1 mg/g (ref 0.0–30.0)
Microalb, Ur: <0.7 mg/dL (ref 0.0–1.9)

## 2022-03-15 LAB — POCT GLYCOSYLATED HEMOGLOBIN (HGB A1C): Hemoglobin A1C: 5.8 % — AB (ref 4.0–5.6)

## 2022-03-15 NOTE — Assessment & Plan Note (Signed)
Lab Results  Component Value Date   HGBA1C 5.8 (A) 03/15/2022   Well controlled Continue metformin 1000 mg bid and good diet  Eye exam is up to date Disc plan for weight bearing exercise  Nl foot exam On ace and statin  F/u 6 mo

## 2022-03-15 NOTE — Assessment & Plan Note (Signed)
bp in fair control at this time  BP Readings from Last 1 Encounters:  03/15/22 130/70   No changes needed Most recent labs reviewed  Disc lifstyle change with low sodium diet and exercise  Plan to continue  Lisinopril 5 mg daily  Spironolactone 12.5 mg daily

## 2022-03-15 NOTE — Assessment & Plan Note (Signed)
Disc goals for lipids and reasons to control them Rev last labs with pt Rev low sat fat diet in detail  Plan to continue lovastatin 20 mg daily and gemfibrozil 600 mg bid  Would like go get LDL under 70  Does not tolerate other statins   Is working on diet Will re check at [facility] and consider inc statin if needed

## 2022-03-15 NOTE — Patient Instructions (Addendum)
Think about some upper body strength training (weights or exercise bands or machines or gym)  Think about the gym and silver sneakers   Walking /biking are great cardio and also weight bearing    Try to get most of your carbohydrates from produce (with the exception of white potatoes)  Eat less bread/pasta/rice/snack foods/cereals/sweets and other items from the middle of the grocery store (processed carbs)  A1c is down to 5.8  (great)   Urine protein test today   Follow up after 09/13/22 for your annual exam

## 2022-03-15 NOTE — Assessment & Plan Note (Signed)
Managed by gyn ? If they were discussing forteo? She is on boniva  No falls or fx   Disc options for weight bearing and strength training exercise for bone health  Takes D Handouts given Disc fall prevention

## 2022-03-15 NOTE — Progress Notes (Signed)
Subjective:    Patient ID: Amy Fry, female    DOB: 02-Dec-1945, 77 y.o.   MRN: [id number]  HPI Pt presents for 6 month f/u for DM2 and chronic medical problems   Wt Readings from Last 3 Encounters:  03/15/22 158 lb 4 oz (71.8 kg)  09/12/21 162 lb (73.5 kg)  06/06/21 159 lb (72.1 kg)   28.71 kg/m  Feeling pretty good  Had to have a shot in her knee and it help   Husband had knee replacement - that was a lot of work for her   Saw gyn for her OP  Proposed a daily shot  Was taking boniva   Used the sitting pedaler for a while    HTN bp is stable today  No cp or palpitations or headaches or edema  No side effects to medicines  BP Readings from Last 3 Encounters:  03/15/22 130/70  09/12/21 130/78  03/14/21 124/72    Pulse Readings from Last 3 Encounters:  03/15/22 76  09/12/21 67  03/14/21 63    Lisinopril 5 mg daily  Spironolactone 12.5 mg daily    DM2 Lab Results  Component Value Date   HGBA1C 6.3 09/05/2021    Today 5.8    good!   Metformin 1000 mg bid  Diet - better   (she does not eat breakfast) , eating less  She avoids sugar and sweets  Too much bread Avoids other carbs   Has lost 4 lb  Eye exam 08/2021   Lab Results  Component Value Date   MICROALBUR 0.2 07/30/2008    Lab Results  Component Value Date   CREATININE 0.83 09/05/2021   BUN 22 09/05/2021   NA 138 09/05/2021   K 4.8 09/05/2021   CL 105 09/05/2021   CO2 27 09/05/2021    GFR 69.22   Takes ace and statin   Hyperlipidemia Lab Results  Component Value Date   CHOL 169 09/05/2021   HDL 60.00 09/05/2021   LDLCALC 92 09/05/2021   TRIG 86.0 09/05/2021   CHOLHDL 3 09/05/2021   LDL was 92 Takes lovastatin 20 mg daily  Gemfibrozil 600 mg bid   Patient Active Problem List   Diagnosis Date Noted   Skin lesion of left arm 09/12/2021   Need for hepatitis C screening test 12/13/2015   Obesity 12/11/2014   Herpes simplex 03/16/2014   Pedal edema 06/15/2013    History of fracture of arm 06/09/2013   Encounter for Medicare annual wellness exam 06/17/2012   Routine general medical examination at a health care facility 02/18/2011   Vitamin D deficiency 07/30/2008   DERMATOPHYTOSIS, NAIL 07/26/2006   Diabetes type 2, controlled ([place]) 07/25/2006   Hyperlipidemia associated with type 2 diabetes mellitus ([place]) 07/25/2006   Essential hypertension 07/25/2006   Osteoporosis 07/25/2006   Past Medical History:  Diagnosis Date   Arthritis    Basal cell carcinoma 06/09/2015   Right lateral nasal bridge. Superfiical.   Diabetes mellitus    type II   Early cataracts, bilateral    no sx yet    Hyperlipidemia    Hypertension    Osteopenia    Past Surgical History:  Procedure Laterality Date   CYSTOSCOPY  10-1999   FRACTURE SURGERY  2010   rib fx- from fall    LAPAROSCOPIC CHOLECYSTECTOMY     OOPHORECTOMY     ORIF HUMERUS FRACTURE Left 06/09/2013   Procedure: OPEN REDUCTION INTERNAL FIXATION (ORIF) PROXIMAL HUMERUS FRACTURE;  Surgeon: Legrand Como  Shari Heritage, MD;  Location: [facility];  Service: Orthopedics;  Laterality: Left;   Social History   Tobacco Use   Smoking status: Former   Smokeless tobacco: Never   Tobacco comments:    a little as a teenager  Vaping Use   Vaping Use: Never used  Substance Use Topics   Alcohol use: No    Alcohol/week: 0.0 standard drinks of alcohol   Drug use: No   Family History  Problem Relation Age of Onset   Heart attack Mother    Heart attack Brother    Stroke Brother    Depression Sister    Colon cancer Neg Hx    Stomach cancer Neg Hx    Esophageal cancer Neg Hx    Rectal cancer Neg Hx    Allergies  Allergen Reactions   Alendronate Sodium     REACTION: reaction not known   Atorvastatin     REACTION: headaches   Pravastatin Sodium     REACTION: headaches and arm pain   Current Outpatient Medications on File Prior to Visit  Medication Sig Dispense Refill   Ascorbic Acid (VITAMIN C) 1000 MG tablet Take 500  mg by mouth daily.     aspirin 81 MG EC tablet Take 81 mg by mouth daily.     Calcium Carbonate-Vit D-Min (CALCIUM 1200 PO) Take 1 tablet by mouth 2 (two) times daily.     Cholecalciferol (VITAMIN D) 2000 UNITS CAPS Take 2,000 Units by mouth daily.      CRANBERRY PO Take 4,200 Units by mouth daily.     gemfibrozil (LOPID) 600 MG tablet TAKE 1 TABLET BY MOUTH TWICE (2) DAILY 180 tablet 3   ibandronate (BONIVA) 150 MG tablet      lisinopril (ZESTRIL) 5 MG tablet Take 1 tablet (5 mg total) by mouth daily. 90 tablet 3   lovastatin (MEVACOR) 20 MG tablet Take 1 tablet (20 mg total) by mouth daily. 90 tablet 3   metFORMIN (GLUCOPHAGE) 1000 MG tablet TAKE 1 TABLET BY MOUTH TWICE (2) DAILY WITH A MEAL 180 tablet 3   Omega-3 300 MG CAPS Take 300 mg by mouth 2 (two) times daily.      spironolactone (ALDACTONE) 25 MG tablet Take 0.5 tablets (12.5 mg total) by mouth daily. 45 tablet 3   vitamin B-12 (CYANOCOBALAMIN) 1000 MCG tablet Take 1,000 mcg by mouth daily.     No current facility-administered medications on file prior to visit.    Review of Systems  Constitutional:  Negative for activity change, appetite change, fatigue, fever and unexpected weight change.  HENT:  Negative for congestion, ear pain, rhinorrhea, sinus pressure and sore throat.   Eyes:  Negative for pain, redness and visual disturbance.  Respiratory:  Negative for cough, shortness of breath and wheezing.   Cardiovascular:  Negative for chest pain and palpitations.  Gastrointestinal:  Negative for abdominal pain, blood in stool, constipation and diarrhea.  Endocrine: Negative for polydipsia and polyuria.  Genitourinary:  Negative for dysuria, frequency and urgency.  Musculoskeletal:  Positive for arthralgias. Negative for back pain and myalgias.  Skin:  Negative for pallor and rash.  Allergic/Immunologic: Negative for environmental allergies.  Neurological:  Negative for dizziness, syncope and headaches.  Hematological:  Negative  for adenopathy. Does not bruise/bleed easily.  Psychiatric/Behavioral:  Negative for decreased concentration and dysphoric mood. The patient is not nervous/anxious.        Objective:   Physical Exam Constitutional:      General: She is  not in acute distress.    Appearance: Normal appearance. She is well-developed and normal weight. She is not ill-appearing or diaphoretic.  HENT:     Head: Normocephalic and atraumatic.     Mouth/Throat:     Pharynx: Oropharyngeal exudate present.  Eyes:     Conjunctiva/sclera: Conjunctivae normal.     Pupils: Pupils are equal, round, and reactive to light.  Neck:     Thyroid: No thyromegaly.     Vascular: No carotid bruit or JVD.  Cardiovascular:     Rate and Rhythm: Normal rate and regular rhythm.     Heart sounds: Normal heart sounds.     No gallop.  Pulmonary:     Effort: Pulmonary effort is normal. No respiratory distress.     Breath sounds: Normal breath sounds. No wheezing or rales.  Abdominal:     General: There is no distension or abdominal bruit.     Palpations: Abdomen is soft.  Musculoskeletal:     Cervical back: Normal range of motion and neck supple.     Right lower leg: No edema.     Left lower leg: No edema.  Lymphadenopathy:     Cervical: No cervical adenopathy.  Skin:    General: Skin is warm and dry.     Coloration: Skin is not pale.     Findings: No rash.  Neurological:     Mental Status: She is alert.     Coordination: Coordination normal.     Deep Tendon Reflexes: Reflexes are normal and symmetric. Reflexes normal.  Psychiatric:        Mood and Affect: Mood normal.           Assessment & Plan:   Problem List Items Addressed This Visit       Cardiovascular and Mediastinum   Essential hypertension    bp in fair control at this time  BP Readings from Last 1 Encounters:  03/15/22 130/70  No changes needed Most recent labs reviewed  Disc lifstyle change with low sodium diet and exercise  Plan to  continue  Lisinopril 5 mg daily  Spironolactone 12.5 mg daily          Endocrine   Diabetes type 2, controlled ([place]) - Primary    Lab Results  Component Value Date   HGBA1C 5.8 (A) 03/15/2022  Well controlled Continue metformin 1000 mg bid and good diet  Eye exam is up to date Disc plan for weight bearing exercise  Nl foot exam On ace and statin  F/u 6 mo      Relevant Orders   POCT HgB A1C (Completed)   Microalbumin / creatinine urine ratio   Hyperlipidemia associated with type 2 diabetes mellitus ([place])    Disc goals for lipids and reasons to control them Rev last labs with pt Rev low sat fat diet in detail  Plan to continue lovastatin 20 mg daily and gemfibrozil 600 mg bid  Would like go get LDL under 70  Does not tolerate other statins   Is working on diet Will re check at cpx and consider inc statin if needed        Musculoskeletal and Integument   Osteoporosis    Managed by gyn ? If they were discussing forteo? She is on boniva  No falls or fx   Disc options for weight bearing and strength training exercise for bone health  Takes D Handouts given Disc fall prevention

## 2022-03-16 ENCOUNTER — Encounter: Payer: Self-pay | Admitting: *Deleted

## 2022-04-05 ENCOUNTER — Telehealth: Payer: Self-pay | Admitting: Family Medicine

## 2022-04-05 MED ORDER — METFORMIN HCL 1000 MG PO TABS: ORAL_TABLET | ORAL | 1 refills | Status: DC

## 2022-04-05 MED ORDER — SPIRONOLACTONE 25 MG PO TABS: 12.5000 mg | ORAL_TABLET | Freq: Every day | ORAL | 1 refills | Status: DC

## 2022-04-05 MED ORDER — GEMFIBROZIL 600 MG PO TABS: ORAL_TABLET | ORAL | 1 refills | Status: DC

## 2022-04-05 MED ORDER — LOVASTATIN 20 MG PO TABS: 20.0000 mg | ORAL_TABLET | Freq: Every day | ORAL | 1 refills | Status: DC

## 2022-04-05 MED ORDER — LISINOPRIL 5 MG PO TABS: 5.0000 mg | ORAL_TABLET | Freq: Every day | ORAL | 1 refills | Status: DC

## 2022-04-05 NOTE — Telephone Encounter (Signed)
Prescription Request  04/05/2022  LOV: 03/15/2022  What is the name of the medication or equipment?  spironolactone (ALDACTONE) 25 MG tablet  metFORMIN (GLUCOPHAGE) 1000 MG tablet   lovastatin (MEVACOR) 20 MG tablet   lisinopril (ZESTRIL) 5 MG tablet  gemfibrozil (LOPID) 600 MG tablet   Have you contacted your pharmacy to request a refill? Yes   Which pharmacy would you like this sent to?  [facility] [id number], [place] [address] Phone: [phone] Fax: [phone]   Patient would like all of theses rx sent in for a year supply  Patient notified that their request is being sent to the clinical staff for review and that they should receive a response within 2 business days.   Please advise at [facility]

## 2022-04-05 NOTE — Telephone Encounter (Signed)
I am aware and she has had no problems in the past For her the benefits have outweighed the risks If she develops any side effects as always- let us know

## 2022-04-05 NOTE — Telephone Encounter (Signed)
Pharmacy called in stating that medications lovastatin (MEVACOR) 20 MG tablet and  gemfibrozil (LOPID) 600 MG tablet are not suppose to be taken together,so she would like to know that the benefit would outweigh the risk in this case,due to patients having issues taking them together in the past. Please advise.

## 2022-04-06 NOTE — Telephone Encounter (Signed)
Left VM letting pharmacy know Dr. Tower's comments  

## 2022-05-08 ENCOUNTER — Telehealth: Payer: Self-pay | Admitting: Family Medicine

## 2022-05-08 NOTE — Telephone Encounter (Signed)
Contacted Amy Fry to schedule their annual wellness visit. Appointment made for 06/19/2022. Left a detailed message regarding changes to this appt.   [facility] Care Guide [facility] [place] AWV TEAM Direct Dial: [phone]

## 2022-05-10 ENCOUNTER — Ambulatory Visit: Payer: PPO | Admitting: Dermatology

## 2022-06-06 ENCOUNTER — Encounter: Payer: Self-pay | Admitting: Internal Medicine

## 2022-06-15 ENCOUNTER — Encounter: Payer: Self-pay | Admitting: Internal Medicine

## 2022-06-19 ENCOUNTER — Ambulatory Visit (INDEPENDENT_AMBULATORY_CARE_PROVIDER_SITE_OTHER): Payer: HMO

## 2022-06-19 VITALS — Wt 158.0 lb

## 2022-06-19 DIAGNOSIS — Z0000 Encounter for general adult medical examination without abnormal findings: Secondary | ICD-10-CM

## 2022-06-19 NOTE — Progress Notes (Signed)
Subjective:   Amy Fry is a 77 y.o. female who presents for Medicare Annual (Subsequent) preventive examination.  Review of Systems    I connected with  Keiko S Straka on 06/19/22 by a audio enabled telemedicine application and verified that I am speaking with the correct person using two identifiers.  Patient Location: Home  Provider Location: Home Office  I discussed the limitations of evaluation and management by telemedicine. The patient expressed understanding and agreed to proceed.  Cardiac Risk Factors include: advanced age (>54men, >76 women);hypertension;diabetes mellitus     Objective:    Today's Vitals   06/19/22 1032  Weight: 158 lb (71.7 kg)   Body mass index is 28.67 kg/m.     06/19/2022   10:38 AM 06/06/2021    2:56 PM 02/05/2019   10:09 AM 01/30/2018    9:22 AM 06/20/2017    7:50 AM 01/24/2017   10:56 AM 12/14/2015    9:23 AM  Advanced Directives  Does Patient Have a Medical Advance Directive? No No No No No No No  Would patient like information on creating a medical advance directive? No - Patient declined No - Patient declined No - Patient declined No - Patient declined  No - Patient declined     Current Medications (verified) Outpatient Encounter Medications as of 06/19/2022  Medication Sig   Ascorbic Acid (VITAMIN C) 1000 MG tablet Take 500 mg by mouth daily.   aspirin 81 MG EC tablet Take 81 mg by mouth daily.   Calcium Carbonate-Vit D-Min (CALCIUM 1200 PO) Take 1 tablet by mouth 2 (two) times daily.   Cholecalciferol (VITAMIN D) 2000 UNITS CAPS Take 2,000 Units by mouth daily.    CRANBERRY PO Take 4,200 Units by mouth daily.   gemfibrozil (LOPID) 600 MG tablet TAKE 1 TABLET BY MOUTH TWICE (2) DAILY   ibandronate (BONIVA) 150 MG tablet    lisinopril (ZESTRIL) 5 MG tablet Take 1 tablet (5 mg total) by mouth daily.   lovastatin (MEVACOR) 20 MG tablet Take 1 tablet (20 mg total) by mouth daily.   metFORMIN (GLUCOPHAGE) 1000 MG tablet TAKE 1  TABLET BY MOUTH TWICE (2) DAILY WITH A MEAL   Omega-3 300 MG CAPS Take 300 mg by mouth 2 (two) times daily.    spironolactone (ALDACTONE) 25 MG tablet Take 0.5 tablets (12.5 mg total) by mouth daily.   vitamin B-12 (CYANOCOBALAMIN) 1000 MCG tablet Take 1,000 mcg by mouth daily.   No facility-administered encounter medications on file as of 06/19/2022.    Allergies (verified) Alendronate sodium, Atorvastatin, and Pravastatin sodium   History: Past Medical History:  Diagnosis Date   Arthritis    Basal cell carcinoma 06/09/2015   Right lateral nasal bridge. Superfiical.   Diabetes mellitus    type II   Early cataracts, bilateral    no sx yet    Hyperlipidemia    Hypertension    Osteopenia    Past Surgical History:  Procedure Laterality Date   CYSTOSCOPY  10-1999   FRACTURE SURGERY  2010   rib fx- from fall    LAPAROSCOPIC CHOLECYSTECTOMY     OOPHORECTOMY     ORIF HUMERUS FRACTURE Left 06/09/2013   Procedure: OPEN REDUCTION INTERNAL FIXATION (ORIF) PROXIMAL HUMERUS FRACTURE;  Surgeon: Budd Palmer, MD;  Location: [facility] OR;  Service: Orthopedics;  Laterality: Left;   Family History  Problem Relation Age of Onset   Heart attack Mother    Heart attack Brother    Stroke Brother  Depression Sister    Colon cancer Neg Hx    Stomach cancer Neg Hx    Esophageal cancer Neg Hx    Rectal cancer Neg Hx    Social History   Socioeconomic History   Marital status: Married    Spouse name: Not on file   Number of children: 2   Years of education: Not on file   Highest education level: Not on file  Occupational History   Occupation: CNA    Employer: [employer]  Tobacco Use   Smoking status: Former   Smokeless tobacco: Never   Tobacco comments:    a little as a teenager  Building services engineer Use: Never used  Substance and Sexual Activity   Alcohol use: No    Alcohol/week: 0.0 standard drinks of alcohol   Drug use: No   Sexual activity: Never  Other Topics Concern    Not on file  Social History Narrative   Not on file   Social Determinants of Health   Financial Resource Strain: Low Risk  (06/19/2022)   Overall Financial Resource Strain (CARDIA)    Difficulty of Paying Living Expenses: Not hard at all  Food Insecurity: No Food Insecurity (06/19/2022)   Hunger Vital Sign    Worried About Running Out of Food in the Last Year: Never true    Ran Out of Food in the Last Year: Never true  Transportation Needs: No Transportation Needs (06/19/2022)   PRAPARE - Administrator, Civil Service (Medical): No    Lack of Transportation (Non-Medical): No  Physical Activity: Sufficiently Active (06/19/2022)   Exercise Vital Sign    Days of Exercise per Week: 7 days    Minutes of Exercise per Session: 30 min  Stress: No Stress Concern Present (06/19/2022)   [employer] of Occupational Health - Occupational Stress Questionnaire    Feeling of Stress : Not at all  Social Connections: Socially Integrated (06/19/2022)   Social Connection and Isolation Panel [NHANES]    Frequency of Communication with Friends and Family: More than three times a week    Frequency of Social Gatherings with Friends and Family: More than three times a week    Attends Religious Services: More than 4 times per year    Active Member of [employer] or Organizations: Yes    Attends Engineer, structural: More than 4 times per year    Marital Status: Married    Tobacco Counseling Counseling given: Yes Tobacco comments: a little as a teenager   Clinical Intake:  Pre-visit preparation completed: Yes  Pain : No/denies pain     BMI - recorded: 28.67 Nutritional Risks: None Diabetes: Yes CBG done?: No Did pt. bring in CBG monitor from home?: No  How often do you need to have someone help you when you read instructions, pamphlets, or other written materials from your doctor or pharmacy?: 1 - Never  Diabetic?yes  Interpreter Needed?: No  Information entered by ::  Fredirick Maudlin   Activities of Daily Living    06/19/2022   10:40 AM  In your present state of health, do you have any difficulty performing the following activities:  Hearing? 0  Vision? 0  Difficulty concentrating or making decisions? 0  Walking or climbing stairs? 0  Dressing or bathing? 0  Doing errands, shopping? 0  Preparing Food and eating ? N  Using the Toilet? N  In the past six months, have you accidently leaked urine? N  Do you have problems with loss of bowel control? N  Managing your Medications? N  Managing your Finances? N  Housekeeping or managing your Housekeeping? N    Patient Care Team: Tower, Audrie Gallus, MD as PCP - General Sinda Du, MD as Consulting Physician (Ophthalmology) Deirdre Evener, MD as Consulting Physician (Dermatology) Richardean Chimera, MD as Consulting Physician (Obstetrics and Gynecology) Vilinda Flake, [facility] as Pharmacist (Pharmacist)  Indicate any recent Medical Services you may have received from other than [facility] providers in the past year (date may be approximate).     Assessment:   This is a routine wellness examination for Kourtnee.  Hearing/Vision screen Hearing Screening - Comments:: Denies hearing difficulties   Vision Screening - Comments:: Wears rx glasses - up to date with routine eye exams with  Dr Jerline Pain  Dietary issues and exercise activities discussed: Current Exercise Habits: Home exercise routine, Type of exercise: stretching;treadmill, Time (Minutes): 45, Frequency (Times/Week): 6, Weekly Exercise (Minutes/Week): 270, Intensity: Moderate, Exercise limited by: None identified   Goals Addressed               This Visit's Progress     Patient Stated     Patient stated (pt-stated)   On track     Try to stay healthy      Depression Screen    06/19/2022   10:35 AM 03/15/2022   10:10 AM 06/06/2021    2:52 PM 02/17/2020    9:51 AM 02/11/2019    3:29 PM 02/05/2019   10:09 AM 01/30/2018    9:21 AM  PHQ 2/9  Scores  PHQ - 2 Score 0 0 0 0 0 0 0  PHQ- 9 Score  0    0 0    Fall Risk    06/19/2022   10:39 AM 03/15/2022   10:10 AM 06/06/2021    2:55 PM 02/17/2020    9:50 AM 02/11/2019    3:28 PM  Fall Risk   Falls in the past year? 0 0 0 0 0  Number falls in past yr: 0 0 0    Injury with Fall? 0 0 0    Risk for fall due to : No Fall Risks No Fall Risks No Fall Risks    Follow up Falls prevention discussed;Falls evaluation completed Falls evaluation completed  Falls evaluation completed     FALL RISK PREVENTION PERTAINING TO THE HOME:  Any stairs in or around the home? Yes  If so, are there any without handrails? Yes  Home free of loose throw rugs in walkways, pet beds, electrical cords, etc? No  Adequate lighting in your home to reduce risk of falls? Yes   ASSISTIVE DEVICES UTILIZED TO PREVENT FALLS:  Life alert? No  Use of a cane, walker or w/c? No  Grab bars in the bathroom? No  Shower chair or bench in shower? No  Elevated toilet seat or a handicapped toilet? Yes   TIMED UP AND GO:  Was the test performed?  No televisit .    Cognitive Function:    02/05/2019   10:11 AM 01/30/2018    9:22 AM 01/24/2017   10:57 AM 12/14/2015    9:27 AM  MMSE - Mini Mental State Exam  Orientation to time 5 5 5 5   Orientation to Place 5 5 5 5   Registration 3 3 3 3   Attention/ Calculation 5 0 0 0  Recall 3 3 3 3   Language- name 2 objects  0 0 0  Language- repeat 1 1 1 1   Language- follow 3 step command  3 3 3   Language- read & follow direction  0 0 0  Write a sentence  0 0 0  Copy design  0 0 0  Total score  20 20 20         06/19/2022   10:36 AM 06/06/2021    2:56 PM  6CIT Screen  What Year? 0 points 0 points  What month? 0 points 0 points  What time? 0 points 0 points  Count back from 20 0 points 0 points  Months in reverse 0 points 0 points  Repeat phrase 0 points 0 points  Total Score 0 points 0 points    Immunizations Immunization History  Administered Date(s) Administered    PFIZER(Purple Top)SARS-COV-2 Vaccination 04/04/2019, 04/25/2019, 11/30/2019   Pneumococcal Conjugate-13 02/04/2018   Pneumococcal Polysaccharide-23 02/11/2019   Tdap 06/05/2011    TDAP status: Due, Education has been provided regarding the importance of this vaccine. Advised may receive this vaccine at local pharmacy or Health Dept. Aware to provide a copy of the vaccination record if obtained from local pharmacy or Health Dept. Verbalized acceptance and understanding.  Flu Vaccine status: Declined, Education has been provided regarding the importance of this vaccine but patient still declined. Advised may receive this vaccine at local pharmacy or Health Dept. Aware to provide a copy of the vaccination record if obtained from local pharmacy or Health Dept. Verbalized acceptance and understanding.  Pneumococcal vaccine status: Up to date  Covid-19 vaccine status: Information provided on how to obtain vaccines.   Qualifies for Shingles Vaccine? Yes   Zostavax completed No   Shingrix Completed?: No.    Education has been provided regarding the importance of this vaccine. Patient has been advised to call insurance company to determine out of pocket expense if they have not yet received this vaccine. Advised may also receive vaccine at local pharmacy or Health Dept. Verbalized acceptance and understanding.  Screening Tests Health Maintenance  Topic Date Due   Zoster Vaccines- Shingrix (1 of 2) Never done   DTaP/Tdap/Td (2 - Td or Tdap) 06/04/2021   COVID-19 Vaccine (4 - 2023-24 season) 09/15/2021   MAMMOGRAM  12/01/2021   Colonoscopy  06/21/2022   INFLUENZA VACCINE  12/13/2025 (Originally 08/16/2022)   Diabetic kidney evaluation - eGFR measurement  09/06/2022   HEMOGLOBIN A1C  09/13/2022   OPHTHALMOLOGY EXAM  09/15/2022   Diabetic kidney evaluation - Urine ACR  03/15/2023   FOOT EXAM  03/15/2023   Medicare Annual Wellness (AWV)  06/19/2023   Pneumonia Vaccine 63+ Years old  Completed    DEXA SCAN  Completed   Hepatitis C Screening  Completed   HPV VACCINES  Aged Out    Health Maintenance  Health Maintenance Due  Topic Date Due   Zoster Vaccines- Shingrix (1 of 2) Never done   DTaP/Tdap/Td (2 - Td or Tdap) 06/04/2021   COVID-19 Vaccine (4 - 2023-24 season) 09/15/2021   MAMMOGRAM  12/01/2021   Colonoscopy  06/21/2022    Colorectal cancer screening: Type of screening: Colonoscopy. Completed 06/20/17. Repeat every 5 years  Mammogram status: Completed 12/01/20. Repeat every year  Bone Density status: Completed 09/05/17. Results reflect: Bone density results: OSTEOPOROSIS. Repeat every 2-5 years.  Lung Cancer Screening: (Low Dose CT Chest recommended if Age 24-80 years, 30 pack-year currently smoking OR have quit w/in 15years.) does not qualify.   Lung Cancer Screening Referral: no  Additional Screening:  Hepatitis C Screening: does  not qualify; aged out   Vision Screening: Recommended annual ophthalmology exams for early detection of glaucoma and other disorders of the eye. Is the patient up to date with their annual eye exam?  Yes  Who is the provider or what is the name of the office in which the patient attends annual eye exams? Dr Cathey Endow If pt is not established with a provider, would they like to be referred to a provider to establish care? No .   Dental Screening: Recommended annual dental exams for proper oral hygiene  Community Resource Referral / Chronic Care Management: CRR required this visit?  No   CCM required this visit?  No      Plan:     I have personally reviewed and noted the following in the patient's chart:   Medical and social history Use of alcohol, tobacco or illicit drugs  Current medications and supplements including opioid prescriptions. Patient is not currently taking opioid prescriptions. Functional ability and status Nutritional status Physical activity Advanced directives List of other physicians Hospitalizations,  surgeries, and ER visits in previous 12 months Vitals Screenings to include cognitive, depression, and falls Referrals and appointments  In addition, I have reviewed and discussed with patient certain preventive protocols, quality metrics, and best practice recommendations. A written personalized care plan for preventive services as well as general preventive health recommendations were provided to patient.     Annabell Sabal, CMA   06/19/2022   Nurse Notes: Patient that she has an Mammogram and Dexa with this year . Patient was unsure the dates of studies.Patient is going to contact Dr.McCombs's office to fax the results

## 2022-06-19 NOTE — Patient Instructions (Signed)
Amy Fry , Thank you for taking time to come for your Medicare Wellness Visit. I appreciate your ongoing commitment to your health goals. Please review the following plan we discussed and let me know if I can assist you in the future.   These are the goals we discussed:  Goals       Patient Stated     Patient stated (pt-stated)      Try to stay healthy      Other     Patient Stated      Starting 01/30/2018, I will continue to take medications as prescribed.       Patient Stated      02/05/2019, I will maintain and continue medications as prescribed.        This is a list of the screening recommended for you and due dates:  Health Maintenance  Topic Date Due   DTaP/Tdap/Td vaccine (2 - Td or Tdap) 06/04/2021   Mammogram  12/01/2021   Colon Cancer Screening  06/21/2022   COVID-19 Vaccine (4 - 2023-24 season) 07/05/2023*   Zoster (Shingles) Vaccine (1 of 2) 06/26/2024*   Flu Shot  12/13/2025*   Yearly kidney function blood test for diabetes  09/06/2022   Hemoglobin A1C  09/13/2022   Eye exam for diabetics  09/15/2022   Yearly kidney health urinalysis for diabetes  03/15/2023   Complete foot exam   03/15/2023   Medicare Annual Wellness Visit  06/19/2023   Pneumonia Vaccine  Completed   DEXA scan (bone density measurement)  Completed   Hepatitis C Screening  Completed   HPV Vaccine  Aged Out  *Topic was postponed. The date shown is not the original due date.    Advanced directives: Advance directive discussed with you today. Even though you declined this today, please call our office should you change your mind, and we can give you the proper paperwork for you to fill out.   Conditions/risks identified: Diabetes Mellitus and Nutrition, Adult When you have diabetes, or diabetes mellitus, it is very important to have healthy eating habits because your blood sugar (glucose) levels are greatly affected by what you eat and drink. Eating healthy foods in the right amounts, at  about the same times every day, can help you: Manage your blood glucose. Lower your risk of heart disease. Improve your blood pressure. Reach or maintain a healthy weight. What can affect my meal plan? Every person with diabetes is different, and each person has different needs for a meal plan. Your health care provider may recommend that you work with a dietitian to make a meal plan that is best for you. Your meal plan may vary depending on factors such as: The calories you need. The medicines you take. Your weight. Your blood glucose, blood pressure, and cholesterol levels. Your activity level. Other health conditions you have, such as heart or kidney disease. How do carbohydrates affect me? Carbohydrates, also called carbs, affect your blood glucose level more than any other type of food. Eating carbs raises the amount of glucose in your blood. It is important to know how many carbs you can safely have in each meal. This is different for every person. Your dietitian can help you calculate how many carbs you should have at each meal and for each snack. How does alcohol affect me? Alcohol can cause a decrease in blood glucose (hypoglycemia), especially if you use insulin or take certain diabetes medicines by mouth. Hypoglycemia can be a life-threatening condition. Symptoms of  hypoglycemia, such as sleepiness, dizziness, and confusion, are similar to symptoms of having too much alcohol. Do not drink alcohol if: Your health care provider tells you not to drink. You are pregnant, may be pregnant, or are planning to become pregnant. If you drink alcohol: Limit how much you have to: 0-1 drink a day for women. 0-2 drinks a day for men. Know how much alcohol is in your drink. In the [place] one drink equals one 12 oz bottle of beer (355 mL), one 5 oz glass of wine (148 mL), or one 1 oz glass of hard liquor (44 mL). Keep yourself hydrated with water, diet soda, or unsweetened iced tea. Keep in mind  that regular soda, juice, and other mixers may contain a lot of sugar and must be counted as carbs. What are tips for following this plan?  Reading food labels Start by checking the serving size on the Nutrition Facts label of packaged foods and drinks. The number of calories and the amount of carbs, fats, and other nutrients listed on the label are based on one serving of the item. Many items contain more than one serving per package. Check the total grams (g) of carbs in one serving. Check the number of grams of saturated fats and trans fats in one serving. Choose foods that have a low amount or none of these fats. Check the number of milligrams (mg) of salt (sodium) in one serving. Most people should limit total sodium intake to less than 2,300 mg per day. Always check the nutrition information of foods labeled as "low-fat" or "nonfat." These foods may be higher in added sugar or refined carbs and should be avoided. Talk to your dietitian to identify your daily goals for nutrients listed on the label. Shopping Avoid buying canned, pre-made, or processed foods. These foods tend to be high in fat, sodium, and added sugar. Shop around the outside edge of the grocery store. This is where you will most often find fresh fruits and vegetables, bulk grains, fresh meats, and fresh dairy products. Cooking Use low-heat cooking methods, such as baking, instead of high-heat cooking methods, such as deep frying. Cook using healthy oils, such as olive, canola, or sunflower oil. Avoid cooking with butter, cream, or high-fat meats. Meal planning Eat meals and snacks regularly, preferably at the same times every day. Avoid going long periods of time without eating. Eat foods that are high in fiber, such as fresh fruits, vegetables, beans, and whole grains. Eat 4-6 oz (112-168 g) of lean protein each day, such as lean meat, chicken, fish, eggs, or tofu. One ounce (oz) (28 g) of lean protein is equal to: 1 oz  (28 g) of meat, chicken, or fish. 1 egg.  cup (62 g) of tofu. Eat some foods each day that contain healthy fats, such as avocado, nuts, seeds, and fish. What foods should I eat? Fruits Berries. Apples. Oranges. Peaches. Apricots. Plums. Grapes. Mangoes. Papayas. Pomegranates. Kiwi. Cherries. Vegetables Leafy greens, including lettuce, spinach, kale, chard, collard greens, mustard greens, and cabbage. Beets. Cauliflower. Broccoli. Carrots. Green beans. Tomatoes. Peppers. Onions. Cucumbers. Brussels sprouts. Grains Whole grains, such as whole-wheat or whole-grain bread, crackers, tortillas, cereal, and pasta. Unsweetened oatmeal. Quinoa. Brown or wild rice. Meats and other proteins Seafood. Poultry without skin. Lean cuts of poultry and beef. Tofu. Nuts. Seeds. Dairy Low-fat or fat-free dairy products such as milk, yogurt, and cheese. The items listed above may not be a complete list of foods and beverages you can  eat and drink. Contact a dietitian for more information. What foods should I avoid? Fruits Fruits canned with syrup. Vegetables Canned vegetables. Frozen vegetables with butter or cream sauce. Grains Refined white flour and flour products such as bread, pasta, snack foods, and cereals. Avoid all processed foods. Meats and other proteins Fatty cuts of meat. Poultry with skin. Breaded or fried meats. Processed meat. Avoid saturated fats. Dairy Full-fat yogurt, cheese, or milk. Beverages Sweetened drinks, such as soda or iced tea. The items listed above may not be a complete list of foods and beverages you should avoid. Contact a dietitian for more information. Questions to ask a health care provider Do I need to meet with a certified diabetes care and education specialist? Do I need to meet with a dietitian? What number can I call if I have questions? When are the best times to check my blood glucose? Where to find more information: American Diabetes Association:  diabetes.org Academy of Nutrition and Dietetics: eatright.[employer] of Diabetes and Digestive and Kidney Diseases: StageSync.si Association of Diabetes Care & Education Specialists: diabeteseducator.org Summary It is important to have healthy eating habits because your blood sugar (glucose) levels are greatly affected by what you eat and drink. It is important to use alcohol carefully. A healthy meal plan will help you manage your blood glucose and lower your risk of heart disease. Your health care provider may recommend that you work with a dietitian to make a meal plan that is best for you. This information is not intended to replace advice given to you by your health care provider. Make sure you discuss any questions you have with your health care provider. Document Revised: 08/05/2019 Document Reviewed: 08/05/2019 Elsevier Patient Education  2024 Elsevier Inc.   Next appointment: Follow up in one year for your annual wellness visit 06/20/23   Preventive Care 65 Years and Older, Female Preventive care refers to lifestyle choices and visits with your health care provider that can promote health and wellness. What does preventive care include? A yearly physical exam. This is also called an annual well check. Dental exams once or twice a year. Routine eye exams. Ask your health care provider how often you should have your eyes checked. Personal lifestyle choices, including: Daily care of your teeth and gums. Regular physical activity. Eating a healthy diet. Avoiding tobacco and drug use. Limiting alcohol use. Practicing safe sex. Taking low-dose aspirin every day. Taking vitamin and mineral supplements as recommended by your health care provider. What happens during an annual well check? The services and screenings done by your health care provider during your annual well check will depend on your age, overall health, lifestyle risk factors, and family history of  disease. Counseling  Your health care provider may ask you questions about your: Alcohol use. Tobacco use. Drug use. Emotional well-being. Home and relationship well-being. Sexual activity. Eating habits. History of falls. Memory and ability to understand (cognition). Work and work Astronomer. Reproductive health. Screening  You may have the following tests or measurements: Height, weight, and BMI. Blood pressure. Lipid and cholesterol levels. These may be checked every 5 years, or more frequently if you are over 63 years old. Skin check. Lung cancer screening. You may have this screening every year starting at age 68 if you have a 30-pack-year history of smoking and currently smoke or have quit within the past 15 years. Fecal occult blood test (FOBT) of the stool. You may have this test every year starting at age  50. Flexible sigmoidoscopy or colonoscopy. You may have a sigmoidoscopy every 5 years or a colonoscopy every 10 years starting at age 72. Hepatitis C blood test. Hepatitis B blood test. Sexually transmitted disease (STD) testing. Diabetes screening. This is done by checking your blood sugar (glucose) after you have not eaten for a while (fasting). You may have this done every 1-3 years. Bone density scan. This is done to screen for osteoporosis. You may have this done starting at age 59. Mammogram. This may be done every 1-2 years. Talk to your health care provider about how often you should have regular mammograms. Talk with your health care provider about your test results, treatment options, and if necessary, the need for more tests. Vaccines  Your health care provider may recommend certain vaccines, such as: Influenza vaccine. This is recommended every year. Tetanus, diphtheria, and acellular pertussis (Tdap, Td) vaccine. You may need a Td booster every 10 years. Zoster vaccine. You may need this after age 34. Pneumococcal 13-valent conjugate (PCV13) vaccine. One  dose is recommended after age 54. Pneumococcal polysaccharide (PPSV23) vaccine. One dose is recommended after age 63. Talk to your health care provider about which screenings and vaccines you need and how often you need them. This information is not intended to replace advice given to you by your health care provider. Make sure you discuss any questions you have with your health care provider. Document Released: 01/28/2015 Document Revised: 09/21/2015 Document Reviewed: 11/02/2014 Elsevier Interactive Patient Education  2017 [employer].  Fall Prevention in the Home Falls can cause injuries. They can happen to people of all ages. There are many things you can do to make your home safe and to help prevent falls. What can I do on the outside of my home? Regularly fix the edges of walkways and driveways and fix any cracks. Remove anything that might make you trip as you walk through a door, such as a raised step or threshold. Trim any bushes or trees on the path to your home. Use bright outdoor lighting. Clear any walking paths of anything that might make someone trip, such as rocks or tools. Regularly check to see if handrails are loose or broken. Make sure that both sides of any steps have handrails. Any raised decks and porches should have guardrails on the edges. Have any leaves, snow, or ice cleared regularly. Use sand or salt on walking paths during winter. Clean up any spills in your garage right away. This includes oil or grease spills. What can I do in the bathroom? Use night lights. Install grab bars by the toilet and in the tub and shower. Do not use towel bars as grab bars. Use non-skid mats or decals in the tub or shower. If you need to sit down in the shower, use a plastic, non-slip stool. Keep the floor dry. Clean up any water that spills on the floor as soon as it happens. Remove soap buildup in the tub or shower regularly. Attach bath mats securely with double-sided  non-slip rug tape. Do not have throw rugs and other things on the floor that can make you trip. What can I do in the bedroom? Use night lights. Make sure that you have a light by your bed that is easy to reach. Do not use any sheets or blankets that are too big for your bed. They should not hang down onto the floor. Have a firm chair that has side arms. You can use this for support while you  get dressed. Do not have throw rugs and other things on the floor that can make you trip. What can I do in the kitchen? Clean up any spills right away. Avoid walking on wet floors. Keep items that you use a lot in easy-to-reach places. If you need to reach something above you, use a strong step stool that has a grab bar. Keep electrical cords out of the way. Do not use floor polish or wax that makes floors slippery. If you must use wax, use non-skid floor wax. Do not have throw rugs and other things on the floor that can make you trip. What can I do with my stairs? Do not leave any items on the stairs. Make sure that there are handrails on both sides of the stairs and use them. Fix handrails that are broken or loose. Make sure that handrails are as long as the stairways. Check any carpeting to make sure that it is firmly attached to the stairs. Fix any carpet that is loose or worn. Avoid having throw rugs at the top or bottom of the stairs. If you do have throw rugs, attach them to the floor with carpet tape. Make sure that you have a light switch at the top of the stairs and the bottom of the stairs. If you do not have them, ask someone to add them for you. What else can I do to help prevent falls? Wear shoes that: Do not have high heels. Have rubber bottoms. Are comfortable and fit you well. Are closed at the toe. Do not wear sandals. If you use a stepladder: Make sure that it is fully opened. Do not climb a closed stepladder. Make sure that both sides of the stepladder are locked into place. Ask  someone to hold it for you, if possible. Clearly mark and make sure that you can see: Any grab bars or handrails. First and last steps. Where the edge of each step is. Use tools that help you move around (mobility aids) if they are needed. These include: Canes. Walkers. Scooters. Crutches. Turn on the lights when you go into a dark area. Replace any light bulbs as soon as they burn out. Set up your furniture so you have a clear path. Avoid moving your furniture around. If any of your floors are uneven, fix them. If there are any pets around you, be aware of where they are. Review your medicines with your doctor. Some medicines can make you feel dizzy. This can increase your chance of falling. Ask your doctor what other things that you can do to help prevent falls. This information is not intended to replace advice given to you by your health care provider. Make sure you discuss any questions you have with your health care provider. Document Released: 10/28/2008 Document Revised: 06/09/2015 Document Reviewed: 02/05/2014 Elsevier Interactive Patient Education  2017 [employer].

## 2022-06-20 ENCOUNTER — Encounter: Payer: Self-pay | Admitting: Family Medicine

## 2022-06-25 ENCOUNTER — Ambulatory Visit (INDEPENDENT_AMBULATORY_CARE_PROVIDER_SITE_OTHER): Payer: HMO | Admitting: Dermatology

## 2022-06-25 VITALS — BP 141/80

## 2022-06-25 DIAGNOSIS — L821 Other seborrheic keratosis: Secondary | ICD-10-CM | POA: Diagnosis not present

## 2022-06-25 DIAGNOSIS — D229 Melanocytic nevi, unspecified: Secondary | ICD-10-CM

## 2022-06-25 DIAGNOSIS — D2271 Melanocytic nevi of right lower limb, including hip: Secondary | ICD-10-CM | POA: Diagnosis not present

## 2022-06-25 DIAGNOSIS — Z85828 Personal history of other malignant neoplasm of skin: Secondary | ICD-10-CM | POA: Diagnosis not present

## 2022-06-25 DIAGNOSIS — L578 Other skin changes due to chronic exposure to nonionizing radiation: Secondary | ICD-10-CM

## 2022-06-25 DIAGNOSIS — W908XXA Exposure to other nonionizing radiation, initial encounter: Secondary | ICD-10-CM | POA: Diagnosis not present

## 2022-06-25 DIAGNOSIS — L814 Other melanin hyperpigmentation: Secondary | ICD-10-CM | POA: Diagnosis not present

## 2022-06-25 DIAGNOSIS — D2261 Melanocytic nevi of right upper limb, including shoulder: Secondary | ICD-10-CM

## 2022-06-25 DIAGNOSIS — Z1283 Encounter for screening for malignant neoplasm of skin: Secondary | ICD-10-CM

## 2022-06-25 DIAGNOSIS — X32XXXA Exposure to sunlight, initial encounter: Secondary | ICD-10-CM | POA: Diagnosis not present

## 2022-06-25 NOTE — Progress Notes (Signed)
   Follow-Up Visit   Subjective  Amy Fry is a 77 y.o. female who presents for the following: Skin Cancer Screening and Full Body Skin Exam, hx of BCC R lat nasal bridge  The patient presents for Total-Body Skin Exam (TBSE) for skin cancer screening and mole check. The patient has spots, moles and lesions to be evaluated, some may be new or changing and the patient has concerns that these could be cancer.    The following portions of the chart were reviewed this encounter and updated as appropriate: medications, allergies, medical history  Review of Systems:  No other skin or systemic complaints except as noted in HPI or Assessment and Plan.  Objective  Well appearing patient in no apparent distress; mood and affect are within normal limits.  A full examination was performed including scalp, head, eyes, ears, nose, lips, neck, chest, axillae, abdomen, back, buttocks, bilateral upper extremities, bilateral lower extremities, hands, feet, fingers, toes, fingernails, and toenails. All findings within normal limits unless otherwise noted below.   Relevant physical exam findings are noted in the Assessment and Plan.    Assessment & Plan   LENTIGINES, SEBORRHEIC KERATOSES, HEMANGIOMAS - Benign normal skin lesions - Benign-appearing - Call for any changes LENTIGO vs FLAT SK Exam: R temple/zygoma tan patch 3.5 x 2.0cm  Treatment Plan: Benign-appearing.  Observation.  Call clinic for new or changing lesions.  Recommend daily use of broad spectrum spf 30+ sunscreen to sun-exposed areas.     MELANOCYTIC NEVI - Tan-brown and/or pink-flesh-colored symmetric macules and papules - Benign appearing on exam today - Observation - Call clinic for new or changing moles - Recommend daily use of broad spectrum spf 30+ sunscreen to sun-exposed areas.  NEVUS Exam:  - R elbow 6.83mm flesh pap - R post thigh 2.52mm med dark brown macules x 2  Treatment Plan: Benign appearing on exam today.  Recommend observation. Call clinic for new or changing moles. Recommend daily use of broad spectrum spf 30+ sunscreen to sun-exposed areas.     ACTINIC DAMAGE - Chronic condition, secondary to cumulative UV/sun exposure - diffuse scaly erythematous macules with underlying dyspigmentation - Recommend daily broad spectrum sunscreen SPF 30+ to sun-exposed areas, reapply every 2 hours as needed.  - Staying in the shade or wearing long sleeves, sun glasses (UVA+UVB protection) and wide brim hats (4-inch brim around the entire circumference of the hat) are also recommended for sun protection.  - Call for new or changing lesions. - chest  SKIN CANCER SCREENING PERFORMED TODAY.  HISTORY OF BASAL CELL CARCINOMA OF THE SKIN - No evidence of recurrence today - Recommend regular full body skin exams - Recommend daily broad spectrum sunscreen SPF 30+ to sun-exposed areas, reapply every 2 hours as needed.  - Call if any new or changing lesions are noted between office visits  - R lat nasal bridge   Return in about 1 year (around 06/25/2023) for TBSE, Hx of BCC.  I, Ardis Rowan, RMA, am acting as scribe for Amy Niece, MD .   Documentation: I have reviewed the above documentation for accuracy and completeness, and I agree with the above.  Amy Niece, MD

## 2022-06-25 NOTE — Patient Instructions (Signed)
Due to recent changes in healthcare laws, you may see results of your pathology and/or laboratory studies on MyChart before the doctors have had a chance to review them. We understand that in some cases there may be results that are confusing or concerning to you. Please understand that not all results are received at the same time and often the doctors may need to interpret multiple results in order to provide you with the best plan of care or course of treatment. Therefore, we ask that you please give us 2 business days to thoroughly review all your results before contacting the office for clarification. Should we see a critical lab result, you will be contacted sooner.   If You Need Anything After Your Visit  If you have any questions or concerns for your doctor, please call our main line at [phone] and press option 4 to reach your doctor's medical assistant. If no one answers, please leave a voicemail as directed and we will return your call as soon as possible. Messages left after 4 pm will be answered the following business day.   You may also send us a message via MyChart. We typically respond to MyChart messages within 1-2 business days.  For prescription refills, please ask your pharmacy to contact our office. Our fax number is [phone].  If you have an urgent issue when the clinic is closed that cannot wait until the next business day, you can page your doctor at the number below.    Please note that while we do our best to be available for urgent issues outside of office hours, we are not available 24/7.   If you have an urgent issue and are unable to reach us, you may choose to seek medical care at your doctor's office, retail clinic, urgent care center, or emergency room.  If you have a medical emergency, please immediately call 911 or go to the emergency department.  Pager Numbers  - Dr. Kowalski: [phone]  - Dr. Moye: [phone]  - Dr. Stewart:  [phone]  In the event of inclement weather, please call our main line at [phone] for an update on the status of any delays or closures.  Dermatology Medication Tips: Please keep the boxes that topical medications come in in order to help keep track of the instructions about where and how to use these. Pharmacies typically print the medication instructions only on the boxes and not directly on the medication tubes.   If your medication is too expensive, please contact our office at [phone] option 4 or send us a message through MyChart.   We are unable to tell what your co-pay for medications will be in advance as this is different depending on your insurance coverage. However, we may be able to find a substitute medication at lower cost or fill out paperwork to get insurance to cover a needed medication.   If a prior authorization is required to get your medication covered by your insurance company, please allow us 1-2 business days to complete this process.  Drug prices often vary depending on where the prescription is filled and some pharmacies may offer cheaper prices.  The website www.goodrx.com contains coupons for medications through different pharmacies. The prices here do not account for what the cost may be with help from insurance (it may be cheaper with your insurance), but the website can give you the price if you did not use any insurance.  - You can print the associated coupon and take it with   your prescription to the pharmacy.  - You may also stop by our office during regular business hours and pick up a GoodRx coupon card.  - If you need your prescription sent electronically to a different pharmacy, notify our office through [facility] MyChart or by phone at [phone] option 4.     Si Usted Necesita Algo Despus de Su Visita  Tambin puede enviarnos un mensaje a travs de MyChart. Por lo general respondemos a los mensajes de MyChart en el transcurso de 1 a 2  das hbiles.  Para renovar recetas, por favor pida a su farmacia que se ponga en contacto con nuestra oficina. Nuestro nmero de fax es el [phone].  Si tiene un asunto urgente cuando la clnica est cerrada y que no puede esperar hasta el siguiente da hbil, puede llamar/localizar a su doctor(a) al nmero que aparece a continuacin.   Por favor, tenga en cuenta que aunque hacemos todo lo posible para estar disponibles para asuntos urgentes fuera del horario de oficina, no estamos disponibles las 24 horas del da, los 7 das de la semana.   Si tiene un problema urgente y no puede comunicarse con nosotros, puede optar por buscar atencin mdica  en el consultorio de su doctor(a), en una clnica privada, en un centro de atencin urgente o en una sala de emergencias.  Si tiene una emergencia mdica, por favor llame inmediatamente al 911 o vaya a la sala de emergencias.  Nmeros de bper  - Dr. Kowalski: [phone]  - Dra. Moye: [phone]  - Dra. Stewart: [phone]  En caso de inclemencias del tiempo, por favor llame a nuestra lnea principal al [phone] para una actualizacin sobre el estado de cualquier retraso o cierre.  Consejos para la medicacin en dermatologa: Por favor, guarde las cajas en las que vienen los medicamentos de uso tpico para ayudarle a seguir las instrucciones sobre dnde y cmo usarlos. Las farmacias generalmente imprimen las instrucciones del medicamento slo en las cajas y no directamente en los tubos del medicamento.   Si su medicamento es muy caro, por favor, pngase en contacto con nuestra oficina llamando al [phone] y presione la opcin 4 o envenos un mensaje a travs de MyChart.   No podemos decirle cul ser su copago por los medicamentos por adelantado ya que esto es diferente dependiendo de la cobertura de su seguro. Sin embargo, es posible que podamos encontrar un medicamento sustituto a menor costo o llenar un formulario para que el  seguro cubra el medicamento que se considera necesario.   Si se requiere una autorizacin previa para que su compaa de seguros cubra su medicamento, por favor permtanos de 1 a 2 das hbiles para completar este proceso.  Los precios de los medicamentos varan con frecuencia dependiendo del lugar de dnde se surte la receta y alguna farmacias pueden ofrecer precios ms baratos.  El sitio web www.goodrx.com tiene cupones para medicamentos de diferentes farmacias. Los precios aqu no tienen en cuenta lo que podra costar con la ayuda del seguro (puede ser ms barato con su seguro), pero el sitio web puede darle el precio si no utiliz ningn seguro.  - Puede imprimir el cupn correspondiente y llevarlo con su receta a la farmacia.  - Tambin puede pasar por nuestra oficina durante el horario de atencin regular y recoger una tarjeta de cupones de GoodRx.  - Si necesita que su receta se enve electrnicamente a una farmacia diferente, informe a nuestra oficina a travs de MyChart de [facility]   o por telfono llamando al [phone] y presione la opcin 4.  

## 2022-08-06 ENCOUNTER — Telehealth: Payer: Self-pay

## 2022-08-09 NOTE — Telephone Encounter (Signed)
Reached out to patient to set up follow up visit with provider to discuss chronic conditions.  Telephone encounter attempt : 2   A HIPAA compliant voice message was left requesting a return call.  Instructed patient to call office or to call me at [phone].  Elijio Miles [facility]ist

## 2022-09-18 DIAGNOSIS — H2513 Age-related nuclear cataract, bilateral: Secondary | ICD-10-CM | POA: Diagnosis not present

## 2022-09-18 DIAGNOSIS — H524 Presbyopia: Secondary | ICD-10-CM | POA: Diagnosis not present

## 2022-09-18 LAB — HM DIABETES EYE EXAM

## 2022-09-20 ENCOUNTER — Encounter: Payer: Self-pay | Admitting: Family Medicine

## 2022-10-09 NOTE — Progress Notes (Unsigned)
Subjective:    Patient ID: Amy Fry, female    DOB: 05/11/1945, 77 y.o.   MRN: [id number]  HPI  Here for health maintenance exam and to review chronic medical problems   Wt Readings from Last 3 Encounters:  10/10/22 155 lb 2 oz (70.4 kg)  06/19/22 158 lb (71.7 kg)  03/15/22 158 lb 4 oz (71.8 kg)   27.92 kg/m  Vitals:   10/10/22 1018 10/10/22 1048  BP: (!) 142/76 125/80  Pulse: 66   Temp: 97.6 F (36.4 C)   SpO2: 97%     Immunization History  Administered Date(s) Administered   PFIZER(Purple Top)SARS-COV-2 Vaccination 04/04/2019, 04/25/2019, 11/30/2019   Pneumococcal Conjugate-13 02/04/2018   Pneumococcal Polysaccharide-23 02/11/2019   Tdap 06/05/2011    Health Maintenance Due  Topic Date Due   DTaP/Tdap/Td (2 - Td or Tdap) 06/04/2021   MAMMOGRAM  12/01/2021   Colonoscopy  06/21/2022   COVID-19 Vaccine (4 - 2023-24 season) 09/16/2022   Tetanus shot -will check at pharm  Flu shot - declines   Shingrix - declines    Mammogram 11/2020 - has one scheduled on 12/10  Self breast exam- no lumps   Gyn health No symptoms  Sees gyn Next appointment is dec 10th    Colon cancer screening -colonoscopy 06/2017 with 5 y recall (due)  She is unsure if she wants this at her age  Will follow up with her GI in January to discuss her options    Bone health  Dexa  08/2020 at gyn office  Will get one 12/2022 at gyn office  Osteoporosis Boniva- treatment by gyn / is off now/ drug holiday  Falls-none  Fractures-none  Supplements  ca and D  Exercise : uses her floor pedaler - really likes it  Has 2 lb weights but not using them   Dermatology care  Has appointment next month for that  Not good about sunscreen / does mow and work outside   Mood    10/10/2022   10:22 AM 06/19/2022   10:35 AM 03/15/2022   10:10 AM 06/06/2021    2:52 PM 02/17/2020    9:51 AM  Depression screen PHQ 2/9  Decreased Interest 0 0 0 0 0  Down, Depressed, Hopeless 0 0 0 0 0  PHQ - 2  Score 0 0 0 0 0  Altered sleeping 0  0    Tired, decreased energy 0  0    Change in appetite 0  0    Feeling bad or failure about yourself  0  0    Trouble concentrating 0  0    Moving slowly or fidgety/restless 0  0    Suicidal thoughts 0  0    PHQ-9 Score 0  0    Difficult doing work/chores Not difficult at all  Not difficult at all      HTN bp is stable today  No cp or palpitations or headaches or edema  No side effects to medicines  BP Readings from Last 3 Encounters:  10/10/22 125/80  06/25/22 (!) 141/80  03/15/22 130/70    Lisinopril 5 mg daily  Spironolactone 12.5 mg daily   This am 119 /70s   Lab Results  Component Value Date   NA 139 10/10/2022   K 4.2 10/10/2022   CO2 28 10/10/2022   GLUCOSE 97 10/10/2022   BUN 19 10/10/2022   CREATININE 0.91 10/10/2022   CALCIUM 10.2 10/10/2022   GFR 61.51 10/10/2022   GFRNONAA >  90 06/11/2013   Due for labs  DM2 Lab Results  Component Value Date   HGBA1C 6.3 10/10/2022   Due for labs  Metformin 1000 mg bid  Eye exam -had this month  On ace and statin   When not on vacation she eats healthy   Hyperlipidemia Due for labs  Lovastatin 20 mg daily  Gemfibrozil 600 mg bid      Patient Active Problem List   Diagnosis Date Noted   Need for hepatitis C screening test 12/13/2015   Herpes simplex 03/16/2014   Pedal edema 06/15/2013   History of fracture of arm 06/09/2013   Encounter for Medicare annual wellness exam 06/17/2012   Routine general medical examination at a health care facility 02/18/2011   Vitamin D deficiency 07/30/2008   DERMATOPHYTOSIS, NAIL 07/26/2006   Diabetes mellitus treated with oral medication (HCC) 07/25/2006   Hyperlipidemia associated with type 2 diabetes mellitus (HCC) 07/25/2006   Essential hypertension 07/25/2006   Osteoporosis 07/25/2006   Past Medical History:  Diagnosis Date   Arthritis    Basal cell carcinoma 06/09/2015   Right lateral nasal bridge. Superfiical.    Diabetes mellitus    type II   Early cataracts, bilateral    no sx yet    Hyperlipidemia    Hypertension    Osteopenia    Past Surgical History:  Procedure Laterality Date   CYSTOSCOPY  10-1999   FRACTURE SURGERY  2010   rib fx- from fall    LAPAROSCOPIC CHOLECYSTECTOMY     OOPHORECTOMY     ORIF HUMERUS FRACTURE Left 06/09/2013   Procedure: OPEN REDUCTION INTERNAL FIXATION (ORIF) PROXIMAL HUMERUS FRACTURE;  Surgeon: Budd Palmer, MD;  Location: [facility] OR;  Service: Orthopedics;  Laterality: Left;   Social History   Tobacco Use   Smoking status: Former   Smokeless tobacco: Never   Tobacco comments:    a little as a teenager  Vaping Use   Vaping status: Never Used  Substance Use Topics   Alcohol use: No    Alcohol/week: 0.0 standard drinks of alcohol   Drug use: No   Family History  Problem Relation Age of Onset   Heart attack Mother    Heart attack Brother    Stroke Brother    Depression Sister    Colon cancer Neg Hx    Stomach cancer Neg Hx    Esophageal cancer Neg Hx    Rectal cancer Neg Hx    Allergies  Allergen Reactions   Alendronate Sodium     REACTION: reaction not known   Atorvastatin     REACTION: headaches   Pravastatin Sodium     REACTION: headaches and arm pain   Current Outpatient Medications on File Prior to Visit  Medication Sig Dispense Refill   Ascorbic Acid (VITAMIN C) 1000 MG tablet Take 500 mg by mouth daily.     aspirin 81 MG EC tablet Take 81 mg by mouth daily.     Calcium Carbonate-Vit D-Min (CALCIUM 1200 PO) Take 1 tablet by mouth 2 (two) times daily.     Cholecalciferol (VITAMIN D) 2000 UNITS CAPS Take 2,000 Units by mouth daily.      CRANBERRY PO Take 4,200 Units by mouth daily.     Omega-3 300 MG CAPS Take 300 mg by mouth 2 (two) times daily.      vitamin B-12 (CYANOCOBALAMIN) 1000 MCG tablet Take 1,000 mcg by mouth daily.     No current facility-administered medications on file prior  to visit.    Review of Systems   Constitutional:  Negative for activity change, appetite change, fatigue, fever and unexpected weight change.  HENT:  Negative for congestion, ear pain, rhinorrhea, sinus pressure and sore throat.   Eyes:  Negative for pain, redness and visual disturbance.  Respiratory:  Negative for cough, shortness of breath and wheezing.   Cardiovascular:  Negative for chest pain and palpitations.  Gastrointestinal:  Negative for abdominal pain, blood in stool, constipation and diarrhea.  Endocrine: Negative for polydipsia and polyuria.  Genitourinary:  Negative for dysuria, frequency and urgency.  Musculoskeletal:  Negative for arthralgias, back pain and myalgias.  Skin:  Negative for pallor and rash.  Allergic/Immunologic: Negative for environmental allergies.  Neurological:  Negative for dizziness, syncope and headaches.  Hematological:  Negative for adenopathy. Does not bruise/bleed easily.  Psychiatric/Behavioral:  Negative for decreased concentration and dysphoric mood. The patient is not nervous/anxious.        Objective:   Physical Exam Constitutional:      General: She is not in acute distress.    Appearance: Normal appearance. She is well-developed. She is not ill-appearing or diaphoretic.  HENT:     Head: Normocephalic and atraumatic.     Right Ear: Tympanic membrane, ear canal and external ear normal.     Left Ear: Tympanic membrane, ear canal and external ear normal.     Nose: Nose normal. No congestion.     Mouth/Throat:     Mouth: Mucous membranes are moist.     Pharynx: Oropharynx is clear. No posterior oropharyngeal erythema.  Eyes:     General: No scleral icterus.    Extraocular Movements: Extraocular movements intact.     Conjunctiva/sclera: Conjunctivae normal.     Pupils: Pupils are equal, round, and reactive to light.  Neck:     Thyroid: No thyromegaly.     Vascular: No carotid bruit or JVD.  Cardiovascular:     Rate and Rhythm: Normal rate and regular rhythm.      Pulses: Normal pulses.     Heart sounds: Normal heart sounds.     No gallop.  Pulmonary:     Effort: Pulmonary effort is normal. No respiratory distress.     Breath sounds: Normal breath sounds. No wheezing.     Comments: Good air exch Chest:     Chest wall: No tenderness.  Abdominal:     General: Bowel sounds are normal. There is no distension or abdominal bruit.     Palpations: Abdomen is soft. There is no mass.     Tenderness: There is no abdominal tenderness.     Hernia: No hernia is present.  Genitourinary:    Comments: Breast and pelvic exam are done by gyn provider   Musculoskeletal:        General: No tenderness. Normal range of motion.     Cervical back: Normal range of motion and neck supple. No rigidity. No muscular tenderness.     Right lower leg: No edema.     Left lower leg: No edema.     Comments: No kyphosis   Lymphadenopathy:     Cervical: No cervical adenopathy.  Skin:    General: Skin is warm and dry.     Coloration: Skin is not pale.     Findings: No erythema or rash.     Comments: .lwn   Neurological:     Mental Status: She is alert. Mental status is at baseline.     Cranial Nerves: No  cranial nerve deficit.     Motor: No abnormal muscle tone.     Coordination: Coordination normal.     Gait: Gait normal.     Deep Tendon Reflexes: Reflexes are normal and symmetric. Reflexes normal.  Psychiatric:        Mood and Affect: Mood normal.        Cognition and Memory: Cognition and memory normal.           Assessment & Plan:   Problem List Items Addressed This Visit       Cardiovascular and Mediastinum   Essential hypertension    bp in fair control at this time  BP Readings from Last 1 Encounters:  10/10/22 125/80   No changes needed Most recent labs reviewed  Disc lifstyle change with low sodium diet and exercise  Plan to continue  Lisinopril 5 mg daily  Spironolactone 12.5 mg daily   Lab ordered today      Relevant Medications    gemfibrozil (LOPID) 600 MG tablet   lisinopril (ZESTRIL) 5 MG tablet   lovastatin (MEVACOR) 20 MG tablet   spironolactone (ALDACTONE) 25 MG tablet   Other Relevant Orders   TSH (Completed)   Lipid panel (Completed)   Comprehensive metabolic panel (Completed)   CBC with Differential/Platelet (Completed)     Endocrine   Diabetes mellitus treated with oral medication (HCC)    A1c today  Has been well controlled with metformin 1000 mg bid and diet  Eye exam utd  Microalb feb 2024 On ace and statin       Relevant Medications   lisinopril (ZESTRIL) 5 MG tablet   lovastatin (MEVACOR) 20 MG tablet   metFORMIN (GLUCOPHAGE) 1000 MG tablet   Hyperlipidemia associated with type 2 diabetes mellitus (HCC)    Labs today  Disc goals for lipids and reasons to control them Rev last labs with pt Rev low sat fat diet in detail Continues lovastatin 20 mg daily  Gemfibrozil 600 mg bid       Relevant Medications   gemfibrozil (LOPID) 600 MG tablet   lisinopril (ZESTRIL) 5 MG tablet   lovastatin (MEVACOR) 20 MG tablet   metFORMIN (GLUCOPHAGE) 1000 MG tablet   spironolactone (ALDACTONE) 25 MG tablet   Other Relevant Orders   Lipid panel (Completed)   Comprehensive metabolic panel (Completed)     Musculoskeletal and Integument   Osteoporosis    Dexa 08/2020 at gyn office/ planning next one in 12/2022 Discussed fall prevention, supplements and exercise for bone density   No falls or fracture         Other   Routine general medical examination at a health care facility - Primary    Reviewed health habits including diet and exercise and skin cancer prevention Reviewed appropriate screening tests for age  Also reviewed health mt list, fam hx and immunization status , as well as social and family history   See HPI Labs reviewed and ordered Plans to check into tetanus shot at pharmacy  Declines flu and shingrix vaccines  Utd gyn care/ next visit is in dec  Dexa planned for  December Mammogram ordered for December Discussed fall prevention, supplements and exercise for bone density  Utd derm care  PHQ 0       Vitamin D deficiency    Vitamin D level is therapeutic with current supplementation Disc importance of this to bone and overall health  D level ordered today      Relevant Orders   VITAMIN  D 25 Hydroxy (Vit-D Deficiency, Fractures) (Completed)

## 2022-10-10 ENCOUNTER — Ambulatory Visit (INDEPENDENT_AMBULATORY_CARE_PROVIDER_SITE_OTHER): Payer: HMO | Admitting: Family Medicine

## 2022-10-10 ENCOUNTER — Encounter: Payer: Self-pay | Admitting: Family Medicine

## 2022-10-10 VITALS — BP 125/80 | HR 66 | Temp 97.6°F | Ht 62.5 in | Wt 155.1 lb

## 2022-10-10 DIAGNOSIS — E785 Hyperlipidemia, unspecified: Secondary | ICD-10-CM

## 2022-10-10 DIAGNOSIS — E1169 Type 2 diabetes mellitus with other specified complication: Secondary | ICD-10-CM | POA: Diagnosis not present

## 2022-10-10 DIAGNOSIS — Z0000 Encounter for general adult medical examination without abnormal findings: Secondary | ICD-10-CM | POA: Diagnosis not present

## 2022-10-10 DIAGNOSIS — E119 Type 2 diabetes mellitus without complications: Secondary | ICD-10-CM

## 2022-10-10 DIAGNOSIS — E559 Vitamin D deficiency, unspecified: Secondary | ICD-10-CM

## 2022-10-10 DIAGNOSIS — Z7984 Long term (current) use of oral hypoglycemic drugs: Secondary | ICD-10-CM | POA: Diagnosis not present

## 2022-10-10 DIAGNOSIS — I10 Essential (primary) hypertension: Secondary | ICD-10-CM | POA: Diagnosis not present

## 2022-10-10 DIAGNOSIS — M810 Age-related osteoporosis without current pathological fracture: Secondary | ICD-10-CM | POA: Diagnosis not present

## 2022-10-10 LAB — CBC WITH DIFFERENTIAL/PLATELET
Basophils Absolute: 0.1 10*3/uL (ref 0.0–0.1)
Basophils Relative: 0.9 % (ref 0.0–3.0)
Eosinophils Absolute: 0.1 10*3/uL (ref 0.0–0.7)
Eosinophils Relative: 2.2 % (ref 0.0–5.0)
HCT: 40.7 % (ref 36.0–46.0)
Hemoglobin: 13 g/dL (ref 12.0–15.0)
Lymphocytes Relative: 43 % (ref 12.0–46.0)
Lymphs Abs: 2.5 10*3/uL (ref 0.7–4.0)
MCHC: 31.9 g/dL (ref 30.0–36.0)
MCV: 90.5 fl (ref 78.0–100.0)
Monocytes Absolute: 0.5 10*3/uL (ref 0.1–1.0)
Monocytes Relative: 8.4 % (ref 3.0–12.0)
Neutro Abs: 2.6 10*3/uL (ref 1.4–7.7)
Neutrophils Relative %: 45.5 % (ref 43.0–77.0)
Platelets: 300 10*3/uL (ref 150.0–400.0)
RBC: 4.5 Mil/uL (ref 3.87–5.11)
RDW: 15 % (ref 11.5–15.5)
WBC: 5.7 10*3/uL (ref 4.0–10.5)

## 2022-10-10 LAB — COMPREHENSIVE METABOLIC PANEL
ALT: 12 U/L (ref 0–35)
AST: 17 U/L (ref 0–37)
Albumin: 4.7 g/dL (ref 3.5–5.2)
Alkaline Phosphatase: 79 U/L (ref 39–117)
BUN: 19 mg/dL (ref 6–23)
CO2: 28 mEq/L (ref 19–32)
Calcium: 10.2 mg/dL (ref 8.4–10.5)
Chloride: 102 mEq/L (ref 96–112)
Creatinine, Ser: 0.91 mg/dL (ref 0.40–1.20)
GFR: 61.51 mL/min (ref >60.00)
Glucose, Bld: 97 mg/dL (ref 70–99)
Potassium: 4.2 mEq/L (ref 3.5–5.1)
Sodium: 139 mEq/L (ref 135–145)
Total Bilirubin: 0.4 mg/dL (ref 0.2–1.2)
Total Protein: 7.2 g/dL (ref 6.0–8.3)

## 2022-10-10 LAB — LIPID PANEL
Cholesterol: 169 mg/dL (ref 0–200)
HDL: 69.1 mg/dL (ref >39.00)
LDL Cholesterol: 82 mg/dL (ref 0–99)
NonHDL: 100.1
Total CHOL/HDL Ratio: 2
Triglycerides: 92 mg/dL (ref 0.0–149.0)
VLDL: 18.4 mg/dL (ref 0.0–40.0)

## 2022-10-10 LAB — TSH: TSH: 0.86 u[IU]/mL (ref 0.35–5.50)

## 2022-10-10 LAB — VITAMIN D 25 HYDROXY (VIT D DEFICIENCY, FRACTURES): VITD: 68.91 ng/mL (ref 30.00–100.00)

## 2022-10-10 LAB — HEMOGLOBIN A1C: Hgb A1c MFr Bld: 6.3 % (ref 4.6–6.5)

## 2022-10-10 MED ORDER — METFORMIN HCL 1000 MG PO TABS: ORAL_TABLET | ORAL | 3 refills | Status: DC

## 2022-10-10 MED ORDER — LISINOPRIL 5 MG PO TABS: 5.0000 mg | ORAL_TABLET | Freq: Every day | ORAL | 3 refills | Status: DC

## 2022-10-10 MED ORDER — LOVASTATIN 20 MG PO TABS: 20.0000 mg | ORAL_TABLET | Freq: Every day | ORAL | 3 refills | Status: DC

## 2022-10-10 MED ORDER — SPIRONOLACTONE 25 MG PO TABS: 12.5000 mg | ORAL_TABLET | Freq: Every day | ORAL | 3 refills | Status: DC

## 2022-10-10 MED ORDER — GEMFIBROZIL 600 MG PO TABS: ORAL_TABLET | ORAL | 3 refills | Status: DC

## 2022-10-10 NOTE — Assessment & Plan Note (Signed)
Labs today  Disc goals for lipids and reasons to control them Rev last labs with pt Rev low sat fat diet in detail Continues lovastatin 20 mg daily  Gemfibrozil 600 mg bid

## 2022-10-10 NOTE — Assessment & Plan Note (Addendum)
Reviewed health habits including diet and exercise and skin cancer prevention Reviewed appropriate screening tests for age  Also reviewed health mt list, fam hx and immunization status , as well as social and family history   See HPI Labs reviewed and ordered Plans to check into tetanus shot at pharmacy  Declines flu and shingrix vaccines  Utd gyn care/ next visit is in dec  Dexa planned for December Due for colonoscopy (5 y recall)-pt prefers to d/w GI in January before she decides to schedule it  Mammogram ordered for December Discussed fall prevention, supplements and exercise for bone density  Utd derm care  PHQ 0

## 2022-10-10 NOTE — Assessment & Plan Note (Signed)
Dexa 08/2020 at gyn office/ planning next one in 12/2022 Discussed fall prevention, supplements and exercise for bone density   No falls or fracture

## 2022-10-10 NOTE — Assessment & Plan Note (Signed)
Vitamin D level is therapeutic with current supplementation Disc importance of this to bone and overall health  D level ordered today

## 2022-10-10 NOTE — Assessment & Plan Note (Signed)
bp in fair control at this time  BP Readings from Last 1 Encounters:  10/10/22 125/80   No changes needed Most recent labs reviewed  Disc lifstyle change with low sodium diet and exercise  Plan to continue  Lisinopril 5 mg daily  Spironolactone 12.5 mg daily   Lab ordered today

## 2022-10-10 NOTE — Assessment & Plan Note (Signed)
A1c today  Has been well controlled with metformin 1000 mg bid and diet  Eye exam utd  Microalb feb 2024 On ace and statin

## 2022-10-10 NOTE — Patient Instructions (Addendum)
Ask about a tetanus shot at the pharmacy   See the GI doctor in January to discuss colon cancer screening and decide whether you want to do another colonoscopy   Keep using your pedaler  Add some strength training to your routine, this is important for bone and brain health and can reduce your risk of falls and help your body use insulin properly and regulate weight  Light weights, exercise bands , and internet videos are a good way to start  Yoga (chair or regular), machines , floor exercises or a gym with machines are also good options    Think about sunscreen when working outside   Labs today   If you change your mind about any vaccines (flu or shingles) let us know

## 2022-10-11 ENCOUNTER — Encounter: Payer: Self-pay | Admitting: *Deleted

## 2022-10-14 ENCOUNTER — Other Ambulatory Visit: Payer: Self-pay | Admitting: Family Medicine

## 2022-10-23 ENCOUNTER — Encounter: Payer: Self-pay | Admitting: Dermatology

## 2022-10-23 ENCOUNTER — Ambulatory Visit (INDEPENDENT_AMBULATORY_CARE_PROVIDER_SITE_OTHER): Payer: HMO | Admitting: Dermatology

## 2022-10-23 VITALS — BP 146/88 | HR 64

## 2022-10-23 DIAGNOSIS — L570 Actinic keratosis: Secondary | ICD-10-CM | POA: Diagnosis not present

## 2022-10-23 DIAGNOSIS — L821 Other seborrheic keratosis: Secondary | ICD-10-CM | POA: Diagnosis not present

## 2022-10-23 DIAGNOSIS — W908XXA Exposure to other nonionizing radiation, initial encounter: Secondary | ICD-10-CM | POA: Diagnosis not present

## 2022-10-23 NOTE — Progress Notes (Signed)
   Follow-Up Visit   Subjective  Amy Fry is a 77 y.o. female who presents for the following: Spots on right side of nose and left temple near eye. States areas feel rough. Thinks lesion on temple has been treated before.   The patient has spots, moles and lesions to be evaluated, some may be new or changing and the patient may have concern these could be cancer.     The following portions of the chart were reviewed this encounter and updated as appropriate: medications, allergies, medical history  Review of Systems:  No other skin or systemic complaints except as noted in HPI or Assessment and Plan.  Objective  Well appearing patient in no apparent distress; mood and affect are within normal limits.  A focused examination was performed of the following areas: Face  Relevant physical exam findings are noted in the Assessment and Plan.  right nasal tip Erythematous thin macule with gritty scale.     Assessment & Plan   AK (actinic keratosis) right nasal tip  Actinic keratoses are precancerous spots that appear secondary to cumulative UV radiation exposure/sun exposure over time. They are chronic with expected duration over 1 year. A portion of actinic keratoses will progress to squamous cell carcinoma of the skin. It is not possible to reliably predict which spots will progress to skin cancer and so treatment is recommended to prevent development of skin cancer.  Recommend daily broad spectrum sunscreen SPF 30+ to sun-exposed areas, reapply every 2 hours as needed.  Recommend staying in the shade or wearing long sleeves, sun glasses (UVA+UVB protection) and wide brim hats (4-inch brim around the entire circumference of the hat). Call for new or changing lesions.  Discussed risk factors. Patient prefers to watch for now. Will recheck at [facility] appointment with Dr. Roseanne Reno.     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 - Observe - Call for any changes   Return for [facility] As Scheduled.  I, Lawson Radar, CMA, am acting as scribe for Elie Goody, MD.   Documentation: I have reviewed the above documentation for accuracy and completeness, and I agree with the above.  Elie Goody, MD

## 2022-10-23 NOTE — Patient Instructions (Signed)
Recommend daily broad spectrum sunscreen SPF 30+ to sun-exposed areas, reapply every 2 hours as needed. Call for new or changing lesions.  Staying in the shade or wearing long sleeves, sun glasses (UVA+UVB protection) and wide brim hats (4-inch brim around the entire circumference of the hat) are also recommended for sun protection.    Seborrheic Keratosis  What causes seborrheic keratoses? Seborrheic keratoses are harmless, common skin growths that first appear during adult life.  As time goes by, more growths appear.  Some people may develop a large number of them.  Seborrheic keratoses appear on both covered and uncovered body parts.  They are not caused by sunlight.  The tendency to develop seborrheic keratoses can be inherited.  They vary in color from skin-colored to gray, brown, or even black.  They can be either smooth or have a rough, warty surface.   Seborrheic keratoses are superficial and look as if they were stuck on the skin.  Under the microscope this type of keratosis looks like layers upon layers of skin.  That is why at times the top layer may seem to fall off, but the rest of the growth remains and re-grows.    Treatment Seborrheic keratoses do not need to be treated, but can easily be removed in the office.  Seborrheic keratoses often cause symptoms when they rub on clothing or jewelry.  Lesions can be in the way of shaving.  If they become inflamed, they can cause itching, soreness, or burning.  Removal of a seborrheic keratosis can be accomplished by freezing, burning, or surgery. If any spot bleeds, scabs, or grows rapidly, please return to have it checked, as these can be an indication of a skin cancer.   Due to recent changes in healthcare laws, you may see results of your pathology and/or laboratory studies on MyChart before the doctors have had a chance to review them. We understand that in some cases there may be results that are confusing or concerning to you. Please  understand that not all results are received at the same time and often the doctors may need to interpret multiple results in order to provide you with the best plan of care or course of treatment. Therefore, we ask that you please give [place] 2 business days to thoroughly review all your results before contacting the office for clarification. Should we see a critical lab result, you will be contacted sooner.   If You Need Anything After Your Visit  If you have any questions or concerns for your doctor, please call our main line at [phone] and press option 4 to reach your doctor's medical assistant. If no one answers, please leave a voicemail as directed and we will return your call as soon as possible. Messages left after 4 pm will be answered the following business day.   You may also send [place] a message via MyChart. We typically respond to MyChart messages within 1-2 business days.  For prescription refills, please ask your pharmacy to contact our office. Our fax number is [phone].  If you have an urgent issue when the clinic is closed that cannot wait until the next business day, you can page your doctor at the number below.    Please note that while we do our best to be available for urgent issues outside of office hours, we are not available 24/7.   If you have an urgent issue and are unable to reach [place], you may choose to seek medical care at your  doctor's office, retail clinic, urgent care center, or emergency room.  If you have a medical emergency, please immediately call 911 or go to the emergency department.  Pager Numbers  - Dr. Gwen Pounds: [phone]  - Dr. Roseanne Reno: [phone]  - Dr. Katrinka Blazing: [phone]   In the event of inclement weather, please call our main line at [phone] for an update on the status of any delays or closures.  Dermatology Medication Tips: Please keep the boxes that topical medications come in in order to help keep track of the instructions  about where and how to use these. Pharmacies typically print the medication instructions only on the boxes and not directly on the medication tubes.   If your medication is too expensive, please contact our office at [phone] option 4 or send [place] a message through MyChart.   We are unable to tell what your co-pay for medications will be in advance as this is different depending on your insurance coverage. However, we may be able to find a substitute medication at lower cost or fill out paperwork to get insurance to cover a needed medication.   If a prior authorization is required to get your medication covered by your insurance company, please allow [place] 1-2 business days to complete this process.  Drug prices often vary depending on where the prescription is filled and some pharmacies may offer cheaper prices.  The website www.goodrx.com contains coupons for medications through different pharmacies. The prices here do not account for what the cost may be with help from insurance (it may be cheaper with your insurance), but the website can give you the price if you did not use any insurance.  - You can print the associated coupon and take it with your prescription to the pharmacy.  - You may also stop by our office during regular business hours and pick up a GoodRx coupon card.  - If you need your prescription sent electronically to a different pharmacy, notify our office through [facility] or by phone at [phone] option 4.     Si Usted Necesita Algo Despus de Su Visita  Tambin puede enviarnos un mensaje a travs de Clinical cytogeneticist. Por lo general respondemos a los mensajes de MyChart en el transcurso de 1 a 2 das hbiles.  Para renovar recetas, por favor pida a su farmacia que se ponga en contacto con nuestra oficina. Annie Sable de fax es [place] [phone].  Si tiene un asunto urgente cuando la clnica est cerrada y que no puede esperar hasta el siguiente da hbil, puede  llamar/localizar a su doctor(a) al nmero que aparece a continuacin.   Por favor, tenga en cuenta que aunque hacemos todo lo posible para estar disponibles para asuntos urgentes fuera del horario de [place], no estamos disponibles las 24 horas del da, los 7 [address]  [address] de la [place].   Si tiene un problema urgente y no puede comunicarse con nosotros, puede optar por buscar atencin mdica  en el consultorio de su doctor(a), en una clnica privada, en un centro de atencin urgente o en una sala de emergencias.  Si tiene Engineer, drilling, por favor llame inmediatamente al 911 o vaya a la sala de emergencias.  Nmeros de bper  - Dr. Gwen Pounds: [phone]  - Dra. Roseanne Reno: [phone]  - Dr. Katrinka Blazing: [phone]   En caso de inclemencias del tiempo, por favor llame a Lacy Duverney principal al [phone] para una actualizacin sobre el [place] de cualquier retraso o cierre.  Consejos para la medicacin en  dermatologa: Por favor, guarde las cajas en las que vienen los medicamentos de uso tpico para ayudarle a seguir las instrucciones sobre dnde y cmo usarlos. Las farmacias generalmente imprimen las instrucciones del medicamento slo en las cajas y no directamente en los tubos del [place].   Si su medicamento es muy caro, por favor, pngase en contacto con Rolm Gala llamando al [phone] y presione la opcin 4 o envenos un mensaje a travs de Clinical cytogeneticist.   No podemos decirle cul ser su copago por los medicamentos por adelantado ya que esto es diferente dependiendo de la cobertura de su seguro. Sin embargo, es posible que podamos encontrar un medicamento sustituto a Audiological scientist un formulario para que el seguro cubra el medicamento que se considera necesario.   Si se requiere una autorizacin previa para que su compaa de seguros [place] su medicamento, por favor permtanos de 1 a 2 das hbiles para completar [address]  Los precios de los medicamentos varan con  frecuencia dependiendo del Environmental consultant de dnde se surte la receta y alguna farmacias pueden ofrecer precios ms baratos.  El sitio web www.goodrx.com tiene cupones para medicamentos de Health and safety inspector. Los precios aqu no tienen en cuenta lo que podra costar con la ayuda del seguro (puede ser ms barato con su seguro), pero el sitio web puede darle el precio si no utiliz Tourist information centre manager.  - Puede imprimir el cupn correspondiente y llevarlo con su receta a la farmacia.  - Tambin puede pasar por nuestra oficina durante el horario de atencin regular y Education officer, museum una tarjeta de cupones de GoodRx.  - Si necesita que su receta se enve electrnicamente a una farmacia diferente, informe a nuestra oficina a travs de MyChart de [place] o por telfono llamando al [phone] y presione la opcin 4.

## 2022-10-26 ENCOUNTER — Ambulatory Visit: Payer: HMO | Admitting: Internal Medicine

## 2022-12-25 ENCOUNTER — Telehealth: Payer: Self-pay | Admitting: Family Medicine

## 2022-12-25 DIAGNOSIS — E119 Type 2 diabetes mellitus without complications: Secondary | ICD-10-CM

## 2022-12-25 DIAGNOSIS — Z1231 Encounter for screening mammogram for malignant neoplasm of breast: Secondary | ICD-10-CM | POA: Diagnosis not present

## 2022-12-25 DIAGNOSIS — Z6827 Body mass index (BMI) 27.0-27.9, adult: Secondary | ICD-10-CM | POA: Diagnosis not present

## 2022-12-25 DIAGNOSIS — Z01419 Encounter for gynecological examination (general) (routine) without abnormal findings: Secondary | ICD-10-CM | POA: Diagnosis not present

## 2022-12-25 DIAGNOSIS — N959 Unspecified menopausal and perimenopausal disorder: Secondary | ICD-10-CM | POA: Diagnosis not present

## 2022-12-25 LAB — HM MAMMOGRAPHY

## 2022-12-25 MED ORDER — BLOOD GLUCOSE MONITORING SUPPL DEVI
1.0000 | Freq: Every day | 0 refills | Status: AC
Start: 2022-12-25 — End: ?

## 2022-12-25 MED ORDER — LANCET DEVICE MISC: 1.0000 | Freq: Every day | 0 refills | Status: DC

## 2022-12-25 MED ORDER — LANCETS MISC. MISC: 1.0000 | Freq: Every day | 0 refills | Status: DC

## 2022-12-25 MED ORDER — BLOOD GLUCOSE TEST VI STRP
1.0000 | ORAL_STRIP | Freq: Every day | 3 refills | Status: AC
Start: 2022-12-25 — End: ?

## 2022-12-25 NOTE — Telephone Encounter (Signed)
I sent  Included lancets and lancet device-can cancerl that if not needed

## 2022-12-25 NOTE — Addendum Note (Signed)
Addended by: Roxy Manns A on: 12/25/2022 07:31 PM   Modules accepted: Orders

## 2022-12-25 NOTE — Telephone Encounter (Signed)
Pt called in stating she needs a new blood sugar monitor & test strips sent to [facility] in [place]. Call back # [phone]

## 2023-01-28 ENCOUNTER — Encounter: Payer: Self-pay | Admitting: Internal Medicine

## 2023-01-28 ENCOUNTER — Ambulatory Visit: Payer: HMO | Admitting: Internal Medicine

## 2023-01-28 VITALS — BP 126/80 | HR 75 | Ht 62.0 in | Wt 160.0 lb

## 2023-01-28 DIAGNOSIS — R151 Fecal smearing: Secondary | ICD-10-CM

## 2023-01-28 DIAGNOSIS — Z1211 Encounter for screening for malignant neoplasm of colon: Secondary | ICD-10-CM

## 2023-01-28 DIAGNOSIS — Z860101 Personal history of adenomatous and serrated colon polyps: Secondary | ICD-10-CM | POA: Diagnosis not present

## 2023-01-28 NOTE — Patient Instructions (Signed)
 You have been scheduled for a colonoscopy. Please follow written instructions given to you at your visit today.   Please pick up your prep supplies at the pharmacy within the next 1-3 days.  If you use inhalers (even only as needed), please bring them with you on the day of your procedure.  DO NOT TAKE 7 DAYS PRIOR TO TEST- Trulicity (dulaglutide) Ozempic, Wegovy (semaglutide) Mounjaro (tirzepatide) Bydureon Bcise (exanatide extended release)  DO NOT TAKE 1 DAY PRIOR TO YOUR TEST Rybelsus (semaglutide) Adlyxin (lixisenatide) Victoza (liraglutide) Byetta (exanatide) _________________________________________________________________________  Please purchase the following medications over the counter and take as directed: Metamucil 1 tablespoon daily.   _______________________________________________________  If your blood pressure at your visit was 140/90 or greater, please contact your primary care physician to follow up on this.  _______________________________________________________  If you are age 33 or older, your body mass index should be between 23-30. Your Body mass index is 29.26 kg/m. If this is out of the aforementioned range listed, please consider follow up with your Primary Care Provider.  If you are age 71 or younger, your body mass index should be between 19-25. Your Body mass index is 29.26 kg/m. If this is out of the aformentioned range listed, please consider follow up with your Primary Care Provider.   ________________________________________________________  The [facility] providers would like to encourage you to use MYCHART to communicate with providers for non-urgent requests or questions.  Due to long hold times on the telephone, sending your provider a message by [facility] may be a faster and more efficient way to get a response.  Please allow 48 business hours for a response.  Please remember that this is for non-urgent requests.   _______________________________________________________

## 2023-01-28 NOTE — Progress Notes (Signed)
 Patient ID: Amy Fry, female   DOB: [birth year], 78 y.o.   MRN: [id number] HPI:  History of Present Illness   [name] is a  78 year old female with a history of adenomatous colon polyps, presents for a discussion regarding surveillance colonoscopy. Her last colonoscopy was performed in 2019, during which an 8mm adenomatous polyp was removed from the descending colon. Two additional polyps from the ascending and transverse colon were also removed, but these were benign and less than 4mm in size.   The patient reports a negative experience during the last colonoscopy, with a sore and scratchy throat post-procedure, possibly due to reflux or saliva irritation of the vocal cords during sedation.  The patient denies any current bowel issues, including blood in stool, abdominal pain, or changes in bowel pattern. There is no family history of colon cancer. The patient is currently taking a baby aspirin  and reports no troublesome upper gastrointestinal symptoms such as heartburn or trouble swallowing.  The patient also reports a problem with fecal smearing, necessitating frequent wiping after bowel movements.   She also has a history of hypertension, hyperlipidemia, diabetes and osteopenia.  Past Medical History:  Diagnosis Date   Arthritis    Basal cell carcinoma 06/09/2015   Right lateral nasal bridge. Superfiical.   Diabetes mellitus    type II   Early cataracts, bilateral    no sx yet    Hyperlipidemia    Hypertension    Osteopenia     Past Surgical History:  Procedure Laterality Date   CYSTOSCOPY  10-1999   FRACTURE SURGERY  2010   rib fx- from fall    LAPAROSCOPIC CHOLECYSTECTOMY     OOPHORECTOMY     ORIF HUMERUS FRACTURE Left 06/09/2013   Procedure: OPEN REDUCTION INTERNAL FIXATION (ORIF) PROXIMAL HUMERUS FRACTURE;  Surgeon: Amy VEAR Bruch, MD;  Location: [facility] OR;  Service: Orthopedics;  Laterality: Left;    Outpatient Medications Prior to Visit  Medication Sig  Dispense Refill   Ascorbic Acid  (VITAMIN C ) 1000 MG tablet Take 500 mg by mouth daily.     aspirin  81 MG EC tablet Take 81 mg by mouth daily.     Blood Glucose Monitoring Suppl DEVI 1 each by Does not apply route daily. May substitute to any manufacturer covered by patient's insurance. 1 each 0   Calcium Carbonate-Vit D-Min (CALCIUM 1200 PO) Take 1 tablet by mouth 2 (two) times daily.     Cholecalciferol (VITAMIN D ) 2000 UNITS CAPS Take 2,000 Units by mouth daily.      CRANBERRY PO Take 4,200 Units by mouth daily.     gemfibrozil  (LOPID ) 600 MG tablet TAKE 1 TABLET BY MOUTH TWICE (2) DAILY 180 tablet 3   Glucose Blood (BLOOD GLUCOSE TEST STRIPS) STRP 1 each by In Vitro route daily. May substitute to any manufacturer covered by patient's insurance. 100 strip 3   Lancets (ONETOUCH DELICA PLUS LANCET33G) MISC Apply 1 each topically daily.     lisinopril  (ZESTRIL ) 5 MG tablet Take 1 tablet (5 mg total) by mouth daily. 90 tablet 3   lovastatin  (MEVACOR ) 20 MG tablet Take 1 tablet (20 mg total) by mouth daily. 90 tablet 3   metFORMIN  (GLUCOPHAGE ) 1000 MG tablet TAKE 1 TABLET BY MOUTH TWICE (2) DAILY WITH A MEAL 180 tablet 3   Omega-3 300 MG CAPS Take 300 mg by mouth 2 (two) times daily.      spironolactone  (ALDACTONE ) 25 MG tablet Take 0.5 tablets (12.5 mg total) by mouth daily.  45 tablet 3   vitamin B-12 (CYANOCOBALAMIN ) 1000 MCG tablet Take 1,000 mcg by mouth daily.     Lancet Device MISC 1 each by Does not apply route daily. May substitute to any manufacturer covered by patient's insurance. 1 each 0   Lancets Misc. MISC 1 each by Does not apply route daily. May substitute to any manufacturer covered by patient's insurance. 100 each 0   No facility-administered medications prior to visit.    Allergies  Allergen Reactions   Alendronate Sodium     REACTION: reaction not known   Atorvastatin     REACTION: headaches   Pravastatin Sodium     REACTION: headaches and arm pain    Family History   Problem Relation Age of Onset   Heart attack Mother    Heart attack Brother    Stroke Brother    Depression Sister    Colon cancer Neg Hx    Stomach cancer Neg Hx    Esophageal cancer Neg Hx    Rectal cancer Neg Hx     Social History   Tobacco Use   Smoking status: Former   Smokeless tobacco: Never   Tobacco comments:    a little as a teenager  Vaping Use   Vaping status: Never Used  Substance Use Topics   Alcohol use: No    Alcohol/week: 0.0 standard drinks of alcohol   Drug use: No    ROS: As per history of present illness, otherwise negative  BP 126/80   Pulse 75   Ht 5' 2 (1.575 m)   Wt 160 lb (72.6 kg)   SpO2 96%   BMI 29.26 kg/m  Gen: awake, alert, NAD HEENT: anicteric  CV: RRR, no mrg Pulm: CTA b/l Abd: soft, NT/ND, +BS throughout Ext: no c/c/e Neuro: nonfocal   RELEVANT LABS AND IMAGING: CBC    Component Value Date/Time   WBC 5.7 10/10/2022 1058   RBC 4.50 10/10/2022 1058   HGB 13.0 10/10/2022 1058   HCT 40.7 10/10/2022 1058   PLT 300.0 10/10/2022 1058   MCV 90.5 10/10/2022 1058   MCH 30.9 06/11/2013 0615   MCHC 31.9 10/10/2022 1058   RDW 15.0 10/10/2022 1058   LYMPHSABS 2.5 10/10/2022 1058   MONOABS 0.5 10/10/2022 1058   EOSABS 0.1 10/10/2022 1058   BASOSABS 0.1 10/10/2022 1058    CMP     Component Value Date/Time   NA 139 10/10/2022 1058   K 4.2 10/10/2022 1058   CL 102 10/10/2022 1058   CO2 28 10/10/2022 1058   GLUCOSE 97 10/10/2022 1058   BUN 19 10/10/2022 1058   CREATININE 0.91 10/10/2022 1058   CALCIUM 10.2 10/10/2022 1058   CALCIUM 8.9 06/11/2013 0615   PROT 7.2 10/10/2022 1058   ALBUMIN 4.7 10/10/2022 1058   AST 17 10/10/2022 1058   ALT 12 10/10/2022 1058   ALKPHOS 79 10/10/2022 1058   BILITOT 0.4 10/10/2022 1058   GFRNONAA >90 06/11/2013 0615   GFRAA >90 06/11/2013 0615    Results   DIAGNOSTIC Colonoscopy: 8 mm adenomatous polyp removed from descending colon; two benign polyps (<4 mm) removed from ascending  and transverse colon (06/20/2017)      ASSESSMENT/PLAN: Assessment and Plan    Colon Polyp Surveillance History of adenomatous polyps with last colonoscopy in 2019. One 8mm adenomatous polyp in the descending colon and two benign polyps in the ascending and transverse colon. No current bowel issues or family history of colon trouble. -Schedule surveillance colonoscopy using Miralax  prep per patient preference.  Fecal Smearing Reports fecal smearing possibly due to internal hemorrhoids or decreased sphincter tone. -After colonoscopy, start Metamucil 1 heaping tablespoon daily to absorb excess mucus and liquid.  If no result consider hemorrhoidal banding should hemorrhoids be the culprit.      Rr:Untzm, Laine LABOR, Md [address] [place],  [place] [address]

## 2023-03-12 ENCOUNTER — Encounter: Payer: Self-pay | Admitting: Internal Medicine

## 2023-03-19 ENCOUNTER — Encounter: Payer: Self-pay | Admitting: Certified Registered Nurse Anesthetist

## 2023-03-21 ENCOUNTER — Encounter: Payer: Self-pay | Admitting: Internal Medicine

## 2023-03-21 ENCOUNTER — Ambulatory Visit: Payer: HMO | Admitting: Internal Medicine

## 2023-03-21 VITALS — BP 131/76 | HR 62 | Temp 97.7°F | Resp 15 | Ht 62.0 in | Wt 160.0 lb

## 2023-03-21 DIAGNOSIS — Z1211 Encounter for screening for malignant neoplasm of colon: Secondary | ICD-10-CM | POA: Diagnosis not present

## 2023-03-21 DIAGNOSIS — R151 Fecal smearing: Secondary | ICD-10-CM

## 2023-03-21 DIAGNOSIS — K5730 Diverticulosis of large intestine without perforation or abscess without bleeding: Secondary | ICD-10-CM | POA: Diagnosis not present

## 2023-03-21 DIAGNOSIS — K648 Other hemorrhoids: Secondary | ICD-10-CM | POA: Diagnosis not present

## 2023-03-21 DIAGNOSIS — D124 Benign neoplasm of descending colon: Secondary | ICD-10-CM

## 2023-03-21 DIAGNOSIS — I10 Essential (primary) hypertension: Secondary | ICD-10-CM | POA: Diagnosis not present

## 2023-03-21 DIAGNOSIS — Z860101 Personal history of adenomatous and serrated colon polyps: Secondary | ICD-10-CM

## 2023-03-21 DIAGNOSIS — K635 Polyp of colon: Secondary | ICD-10-CM

## 2023-03-21 DIAGNOSIS — D125 Benign neoplasm of sigmoid colon: Secondary | ICD-10-CM | POA: Diagnosis not present

## 2023-03-21 MED ORDER — SODIUM CHLORIDE 0.9 % IV SOLN: 500.0000 mL | INTRAVENOUS | Status: DC

## 2023-03-21 NOTE — Patient Instructions (Signed)

## 2023-03-21 NOTE — Progress Notes (Signed)
 GASTROENTEROLOGY PROCEDURE H&P NOTE   Primary Care Physician: Tower, Audrie Gallus, MD    Reason for Procedure:  History of adenomatous colon polyps and fecal smearing  Plan:    Colonoscopy  Patient is appropriate for endoscopic procedure(s) in the ambulatory ([facility]) setting.  The nature of the procedure, as well as the risks, benefits, and alternatives were carefully and thoroughly reviewed with the patient. Ample time for discussion and questions allowed. The patient understood, was satisfied, and agreed to proceed.     HPI: Amy Fry is a 78 y.o. female who presents for colonoscopy.  Medical history as below.  Tolerated the prep.  No recent chest pain or shortness of breath.  No abdominal pain today.  Past Medical History:  Diagnosis Date   Arthritis    Basal cell carcinoma 06/09/2015   Right lateral nasal bridge. Superfiical.   Diabetes mellitus    type II   Early cataracts, bilateral    no sx yet    Hyperlipidemia    Hypertension    Osteopenia     Past Surgical History:  Procedure Laterality Date   CYSTOSCOPY  10-1999   FRACTURE SURGERY  2010   rib fx- from fall    LAPAROSCOPIC CHOLECYSTECTOMY     OOPHORECTOMY     ORIF HUMERUS FRACTURE Left 06/09/2013   Procedure: OPEN REDUCTION INTERNAL FIXATION (ORIF) PROXIMAL HUMERUS FRACTURE;  Surgeon: Budd Palmer, MD;  Location: [facility] OR;  Service: Orthopedics;  Laterality: Left;    Prior to Admission medications   Medication Sig Start Date End Date Taking? Authorizing Provider  Ascorbic Acid (VITAMIN C) 1000 MG tablet Take 500 mg by mouth daily.   Yes [provider]  aspirin 81 MG EC tablet Take 81 mg by mouth daily.   Yes [provider]  Calcium Carbonate-Vit D-Min (CALCIUM 1200 PO) Take 1 tablet by mouth 2 (two) times daily.   Yes [provider]  Cholecalciferol (VITAMIN D) 2000 UNITS CAPS Take 2,000 Units by mouth daily.    Yes [provider]  CRANBERRY PO Take 4,200 Units by  mouth daily.   Yes [provider]  gemfibrozil (LOPID) 600 MG tablet TAKE 1 TABLET BY MOUTH TWICE (2) DAILY 10/10/22  Yes Tower, Marne A, MD  lisinopril (ZESTRIL) 5 MG tablet Take 1 tablet (5 mg total) by mouth daily. 10/10/22  Yes Tower, Audrie Gallus, MD  lovastatin (MEVACOR) 20 MG tablet Take 1 tablet (20 mg total) by mouth daily. 10/10/22  Yes Tower, Audrie Gallus, MD  metFORMIN (GLUCOPHAGE) 1000 MG tablet TAKE 1 TABLET BY MOUTH TWICE (2) DAILY WITH A MEAL 10/10/22  Yes Tower, Marne A, MD  Omega-3 300 MG CAPS Take 300 mg by mouth 2 (two) times daily.    Yes [provider]  spironolactone (ALDACTONE) 25 MG tablet Take 0.5 tablets (12.5 mg total) by mouth daily. 10/10/22  Yes Tower, Audrie Gallus, MD  vitamin B-12 (CYANOCOBALAMIN) 1000 MCG tablet Take 1,000 mcg by mouth daily.   Yes [provider]  Blood Glucose Monitoring Suppl DEVI 1 each by Does not apply route daily. May substitute to any manufacturer covered by patient's insurance. 12/25/22   Tower, Audrie Gallus, MD  Glucose Blood (BLOOD GLUCOSE TEST STRIPS) STRP 1 each by In Vitro route daily. May substitute to any manufacturer covered by patient's insurance. 12/25/22   Tower, Audrie Gallus, MD  Lancets [facility] DELICA PLUS [place]) MISC Apply 1 each topically daily. 12/25/22   [provider]  Current Outpatient Medications  Medication Sig Dispense Refill   Ascorbic Acid (VITAMIN C) 1000 MG tablet Take 500 mg by mouth daily.     aspirin 81 MG EC tablet Take 81 mg by mouth daily.     Calcium Carbonate-Vit D-Min (CALCIUM 1200 PO) Take 1 tablet by mouth 2 (two) times daily.     Cholecalciferol (VITAMIN D) 2000 UNITS CAPS Take 2,000 Units by mouth daily.      CRANBERRY PO Take 4,200 Units by mouth daily.     gemfibrozil (LOPID) 600 MG tablet TAKE 1 TABLET BY MOUTH TWICE (2) DAILY 180 tablet 3   lisinopril (ZESTRIL) 5 MG tablet Take 1 tablet (5 mg total) by mouth daily. 90 tablet 3   lovastatin (MEVACOR) 20 MG tablet Take 1 tablet  (20 mg total) by mouth daily. 90 tablet 3   metFORMIN (GLUCOPHAGE) 1000 MG tablet TAKE 1 TABLET BY MOUTH TWICE (2) DAILY WITH A MEAL 180 tablet 3   Omega-3 300 MG CAPS Take 300 mg by mouth 2 (two) times daily.      spironolactone (ALDACTONE) 25 MG tablet Take 0.5 tablets (12.5 mg total) by mouth daily. 45 tablet 3   vitamin B-12 (CYANOCOBALAMIN) 1000 MCG tablet Take 1,000 mcg by mouth daily.     Blood Glucose Monitoring Suppl DEVI 1 each by Does not apply route daily. May substitute to any manufacturer covered by patient's insurance. 1 each 0   Glucose Blood (BLOOD GLUCOSE TEST STRIPS) STRP 1 each by In Vitro route daily. May substitute to any manufacturer covered by patient's insurance. 100 strip 3   Lancets (ONETOUCH DELICA PLUS LANCET33G) MISC Apply 1 each topically daily.     Current Facility-Administered Medications  Medication Dose Route Frequency Provider Last Rate Last Admin   0.9 %  sodium chloride infusion  500 mL Intravenous Continuous Johnn Krasowski, Carie Caddy, MD        Allergies as of 03/21/2023 - Review Complete 03/21/2023  Allergen Reaction Noted   Alendronate sodium Other (See Comments) 07/25/2006   Pravastatin sodium Other (See Comments) 07/25/2006   Atorvastatin Other (See Comments) 07/25/2006    Family History  Problem Relation Age of Onset   Heart attack Mother    Heart attack Brother    Stroke Brother    Depression Sister    Colon cancer Neg Hx    Stomach cancer Neg Hx    Esophageal cancer Neg Hx    Rectal cancer Neg Hx     Social History   Socioeconomic History   Marital status: Married    Spouse name: Not on file   Number of children: 2   Years of education: Not on file   Highest education level: Not on file  Occupational History   Occupation: CNA    Employer: [employer]  Tobacco Use   Smoking status: Former   Smokeless tobacco: Never   Tobacco comments:    a little as a teenager  Advertising account planner   Vaping status: Never Used  Substance and Sexual  Activity   Alcohol use: No    Alcohol/week: 0.0 standard drinks of alcohol   Drug use: No   Sexual activity: Never  Other Topics Concern   Not on file  Social History Narrative   Not on file   Social Drivers of Health   Financial Resource Strain: Low Risk  (06/19/2022)   Overall Financial Resource Strain (CARDIA)    Difficulty of Paying Living Expenses: Not hard at all  Food  Insecurity: No Food Insecurity (06/19/2022)   Hunger Vital Sign    Worried About Running Out of Food in the Last Year: Never true    Ran Out of Food in the Last Year: Never true  Transportation Needs: No Transportation Needs (06/19/2022)   PRAPARE - Administrator, Civil Service (Medical): No    Lack of Transportation (Non-Medical): No  Physical Activity: Sufficiently Active (06/19/2022)   Exercise Vital Sign    Days of Exercise per Week: 7 days    Minutes of Exercise per Session: 30 min  Stress: No Stress Concern Present (06/19/2022)   [employer] of Occupational Health - Occupational Stress Questionnaire    Feeling of Stress : Not at all  Social Connections: Socially Integrated (06/19/2022)   Social Connection and Isolation Panel [NHANES]    Frequency of Communication with Friends and Family: More than three times a week    Frequency of Social Gatherings with Friends and Family: More than three times a week    Attends Religious Services: More than 4 times per year    Active Member of [employer] or Organizations: Yes    Attends Engineer, structural: More than 4 times per year    Marital Status: Married  Catering manager Violence: Not At Risk (06/19/2022)   Humiliation, Afraid, Rape, and Kick questionnaire    Fear of Current or Ex-Partner: No    Emotionally Abused: No    Physically Abused: No    Sexually Abused: No    Physical Exam: Vital signs in last 24 hours: @BP  (!) 146/81   Pulse 63   Temp 97.7 F (36.5 C) (Temporal)   Resp 16   Ht 5\' 2"  (1.575 m)   Wt 160 lb (72.6 kg)   SpO2 96%    BMI 29.26 kg/m  GEN: NAD EYE: Sclerae anicteric ENT: MMM CV: Non-tachycardic Pulm: CTA b/l GI: Soft, NT/ND NEURO:  Alert & Oriented x 3   Erick Blinks, MD [facility]  03/21/2023 10:14 AM

## 2023-03-21 NOTE — Progress Notes (Signed)
 Report given to PACU, vss

## 2023-03-21 NOTE — Progress Notes (Signed)
 Called to room to assist during endoscopic procedure.  Patient ID and intended procedure confirmed with present staff. Received instructions for my participation in the procedure from the performing physician.

## 2023-03-21 NOTE — Op Note (Signed)
 [facility] Patient Name: Amy Fry Procedure Date: 03/21/2023 10:10 AM MRN: [id number] Endoscopist: Beverley Fiedler , MD, [id number] Age: 78 Referring MD:  Date of Birth: [birth year] Gender: Female Account #: [id number] Procedure:                Colonoscopy Indications:              High risk colon cancer surveillance: Personal                            history of non-advanced adenoma, Last colonoscopy:                            June 2019 (TA x 1); fecal smearing symptom Medicines:                Monitored Anesthesia Care Procedure:                Pre-Anesthesia Assessment:                           - Prior to the procedure, a History and Physical                            was performed, and patient medications and                            allergies were reviewed. The patient's tolerance of                            previous anesthesia was also reviewed. The risks                            and benefits of the procedure and the sedation                            options and risks were discussed with the patient.                            All questions were answered, and informed consent                            was obtained. Prior Anticoagulants: The patient has                            taken no anticoagulant or antiplatelet agents. ASA                            Grade Assessment: II - A patient with mild systemic                            disease. After reviewing the risks and benefits,                            the patient was deemed in satisfactory condition to  undergo the procedure.                           After obtaining informed consent, the colonoscope                            was passed under direct vision. Throughout the                            procedure, the patient's blood pressure, pulse, and                            oxygen saturations were monitored continuously. The                            Olympus Scope SN:  [id number] was introduced through                            the anus and advanced to the cecum, identified by                            appendiceal orifice and ileocecal valve. The                            colonoscopy was performed without difficulty. The                            patient tolerated the procedure well. The quality                            of the bowel preparation was good. The ileocecal                            valve, appendiceal orifice, and rectum were                            photographed. Scope In: 10:25:22 AM Scope Out: 10:44:45 AM Scope Withdrawal Time: 0 hours 12 minutes 43 seconds  Total Procedure Duration: 0 hours 19 minutes 23 seconds  Findings:                 The digital rectal exam was normal.                           The sigmoid colon and descending colon were                            redundant.                           Multiple medium-mouthed diverticula were found in                            the hepatic flexure and ascending colon.  A 4 mm polyp was found in the descending colon. The                            polyp was sessile. The polyp was removed with a                            cold snare. Resection and retrieval were complete.                           A 6 mm polyp was found in the sigmoid colon. The                            polyp was sessile. The polyp was removed with a                            cold snare. Resection and retrieval were complete.                           Internal hemorrhoids were found during                            retroflexion. The hemorrhoids were small.                           The exam was otherwise without abnormality. Complications:            No immediate complications. Estimated Blood Loss:     Estimated blood loss was minimal. Impression:               - Mild diverticulosis at the hepatic flexure and in                            the ascending colon.                            - One 4 mm polyp in the descending colon, removed                            with a cold snare. Resected and retrieved.                           - One 6 mm polyp in the sigmoid colon, removed with                            a cold snare. Resected and retrieved.                           - Small internal hemorrhoids.                           - The examination was otherwise normal. Recommendation:           - Patient has a contact number available for  emergencies. The signs and symptoms of potential                            delayed complications were discussed with the                            patient. Return to normal activities tomorrow.                            Written discharge instructions were provided to the                            patient.                           - Resume previous diet.                           - Continue present medications.                           - Begin Metamucil 1 tablespoon working up to 2                            tablespoons daily for fecal smearing.                           - Await pathology results.                           - No recommendation at this time regarding repeat                            colonoscopy due to age at next surveillance                            interval (> 80 years). Beverley Fiedler, MD 03/21/2023 10:48:27 AM This report has been signed electronically.

## 2023-03-22 ENCOUNTER — Telehealth: Payer: Self-pay

## 2023-03-22 NOTE — Telephone Encounter (Signed)
  Follow up Call-     03/21/2023    9:27 AM  Call back number  Post procedure Call Back phone  # [phone]  Permission to leave phone message Yes     Patient questions:  Do you have a fever, pain , or abdominal swelling? No. Pain Score  0 *  Have you tolerated food without any problems? Yes.    Have you been able to return to your normal activities? Yes.    Do you have any questions about your discharge instructions: Diet   No. Medications  No. Follow up visit  No.  Do you have questions or concerns about your Care? No.  Actions: * If pain score is 4 or above: No action needed, pain <4.

## 2023-03-25 LAB — SURGICAL PATHOLOGY

## 2023-04-01 ENCOUNTER — Encounter: Payer: Self-pay | Admitting: Internal Medicine

## 2023-05-21 ENCOUNTER — Ambulatory Visit (INDEPENDENT_AMBULATORY_CARE_PROVIDER_SITE_OTHER)

## 2023-05-21 VITALS — BP 131/76 | Ht 62.0 in | Wt 159.0 lb

## 2023-05-21 DIAGNOSIS — Z0000 Encounter for general adult medical examination without abnormal findings: Secondary | ICD-10-CM

## 2023-05-21 DIAGNOSIS — Z2821 Immunization not carried out because of patient refusal: Secondary | ICD-10-CM | POA: Diagnosis not present

## 2023-05-21 DIAGNOSIS — E119 Type 2 diabetes mellitus without complications: Secondary | ICD-10-CM | POA: Diagnosis not present

## 2023-05-21 DIAGNOSIS — Z7984 Long term (current) use of oral hypoglycemic drugs: Secondary | ICD-10-CM

## 2023-05-21 NOTE — Patient Instructions (Signed)
 Amy Fry , Thank you for taking time to come for your Medicare Wellness Visit. I appreciate your ongoing commitment to your health goals. Please review the following plan we discussed and let me know if I can assist you in the future.   Referrals/Orders/Follow-Ups/Clinician Recommendations: follow up as scheduled for next AWV.  This is a list of the screening recommended for you and due dates:  Health Maintenance  Topic Date Due   DTaP/Tdap/Td vaccine (2 - Td or Tdap) 06/04/2021   Mammogram  12/01/2021   COVID-19 Vaccine (4 - 2024-25 season) 09/16/2022   Yearly kidney health urinalysis for diabetes  03/15/2023   Complete foot exam   03/15/2023   Hemoglobin A1C  04/09/2023   Zoster (Shingles) Vaccine (1 of 2) 06/26/2024*   Flu Shot  12/13/2025*   Eye exam for diabetics  09/18/2023   Yearly kidney function blood test for diabetes  10/10/2023   Medicare Annual Wellness Visit  05/20/2024   Pneumonia Vaccine  Completed   DEXA scan (bone density measurement)  Completed   Hepatitis C Screening  Completed   HPV Vaccine  Aged Out   Meningitis B Vaccine  Aged Out   Colon Cancer Screening  Discontinued  *Topic was postponed. The date shown is not the original due date.    Advanced directives: (Declined) Advance directive discussed with you today. Even though you declined this today, please call our office should you change your mind, and we can give you the proper paperwork for you to fill out.  Next Medicare Annual Wellness Visit scheduled for next year: Yes  Have you seen your provider in the last 6 months (3 months if uncontrolled diabetes)? Yes

## 2023-05-21 NOTE — Progress Notes (Signed)
 Because this visit was a virtual/telehealth visit,  certain criteria was not obtained, such a blood pressure, CBG if applicable, and timed get up and go. Any medications not marked as "taking" were not mentioned during the medication reconciliation part of the visit. Any vitals not documented were not able to be obtained due to this being a telehealth visit or patient was unable to self-report a recent blood pressure reading due to a lack of equipment at home via telehealth. Vitals that have been documented are verbally provided by the patient.  Subjective:   Amy Fry is a 78 y.o. who presents for a Medicare Wellness preventive visit.  Visit Complete: Virtual I connected with  Amy Fry on 05/21/23 by a audio enabled telemedicine application and verified that I am speaking with the correct person using two identifiers.  Patient Location: Home  Provider Location: Home Office  I discussed the limitations of evaluation and management by telemedicine. The patient expressed understanding and agreed to proceed.  Vital Signs: Because this visit was a virtual/telehealth visit, some criteria may be missing or patient reported. Any vitals not documented were not able to be obtained and vitals that have been documented are patient reported.  VideoDeclined- This patient declined Librarian, academic. Therefore the visit was completed with audio only.  Persons Participating in Visit: Patient.  AWV Questionnaire: No: Patient Medicare AWV questionnaire was not completed prior to this visit.  Cardiac Risk Factors include: advanced age (>19men, >77 women);diabetes mellitus;dyslipidemia;hypertension     Objective:    Today's Vitals   05/21/23 0940  BP: 131/76  Weight: 159 lb (72.1 kg)  Height: 5\' 2"  (1.575 m)   Body mass index is 29.08 kg/m.     05/21/2023    9:39 AM 06/19/2022   10:38 AM 06/06/2021    2:56 PM 02/05/2019   10:09 AM 01/30/2018    9:22 AM  06/20/2017    7:50 AM 01/24/2017   10:56 AM  Advanced Directives  Does Patient Have a Medical Advance Directive? No No No No No No No  Would patient like information on creating a medical advance directive? No - Patient declined No - Patient declined No - Patient declined No - Patient declined No - Patient declined  No - Patient declined    Current Medications (verified) Outpatient Encounter Medications as of 05/21/2023  Medication Sig   Ascorbic Acid  (VITAMIN C ) 1000 MG tablet Take 500 mg by mouth daily.   aspirin  81 MG EC tablet Take 81 mg by mouth daily.   Blood Glucose Monitoring Suppl DEVI 1 each by Does not apply route daily. May substitute to any manufacturer covered by patient's insurance.   Calcium Carbonate-Vit D-Min (CALCIUM 1200 PO) Take 1 tablet by mouth 2 (two) times daily.   Cholecalciferol (VITAMIN D ) 2000 UNITS CAPS Take 2,000 Units by mouth daily.    CRANBERRY PO Take 4,200 Units by mouth daily.   gemfibrozil  (LOPID ) 600 MG tablet TAKE 1 TABLET BY MOUTH TWICE (2) DAILY   Glucose Blood (BLOOD GLUCOSE TEST STRIPS) STRP 1 each by In Vitro route daily. May substitute to any manufacturer covered by patient's insurance.   Lancets (ONETOUCH DELICA PLUS LANCET33G) MISC Apply 1 each topically daily.   lisinopril  (ZESTRIL ) 5 MG tablet Take 1 tablet (5 mg total) by mouth daily.   lovastatin  (MEVACOR ) 20 MG tablet Take 1 tablet (20 mg total) by mouth daily.   metFORMIN  (GLUCOPHAGE ) 1000 MG tablet TAKE 1 TABLET BY MOUTH TWICE (  2) DAILY WITH A MEAL   Omega-3 300 MG CAPS Take 300 mg by mouth 2 (two) times daily.    spironolactone  (ALDACTONE ) 25 MG tablet Take 0.5 tablets (12.5 mg total) by mouth daily.   vitamin B-12 (CYANOCOBALAMIN ) 1000 MCG tablet Take 1,000 mcg by mouth daily.   No facility-administered encounter medications on file as of 05/21/2023.    Allergies (verified) Alendronate sodium, Pravastatin sodium, and Atorvastatin   History: Past Medical History:  Diagnosis Date    Arthritis    Basal cell carcinoma 06/09/2015   Right lateral nasal bridge. Superfiical.   Diabetes mellitus    type II   Early cataracts, bilateral    no sx yet    Hyperlipidemia    Hypertension    Osteopenia    Past Surgical History:  Procedure Laterality Date   CYSTOSCOPY  10-1999   FRACTURE SURGERY  2010   rib fx- from fall    LAPAROSCOPIC CHOLECYSTECTOMY     OOPHORECTOMY     ORIF HUMERUS FRACTURE Left 06/09/2013   Procedure: OPEN REDUCTION INTERNAL FIXATION (ORIF) PROXIMAL HUMERUS FRACTURE;  Surgeon: Arlette Lagos, MD;  Location: [facility] OR;  Service: Orthopedics;  Laterality: Left;   Family History  Problem Relation Age of Onset   Heart attack Mother    Heart attack Brother    Stroke Brother    Depression Sister    Colon cancer Neg Hx    Stomach cancer Neg Hx    Esophageal cancer Neg Hx    Rectal cancer Neg Hx    Social History   Socioeconomic History   Marital status: Married    Spouse name: Not on file   Number of children: 2   Years of education: Not on file   Highest education level: Not on file  Occupational History   Occupation: CNA    Employer: [employer]  Tobacco Use   Smoking status: Former   Smokeless tobacco: Never   Tobacco comments:    a little as a teenager  Advertising account planner   Vaping status: Never Used  Substance and Sexual Activity   Alcohol use: No    Alcohol/week: 0.0 standard drinks of alcohol   Drug use: No   Sexual activity: Never  Other Topics Concern   Not on file  Social History Narrative   Not on file   Social Drivers of Health   Financial Resource Strain: Low Risk  (05/21/2023)   Overall Financial Resource Strain (CARDIA)    Difficulty of Paying Living Expenses: Not hard at all  Food Insecurity: No Food Insecurity (05/21/2023)   Hunger Vital Sign    Worried About Running Out of Food in the Last Year: Never true    Ran Out of Food in the Last Year: Never true  Transportation Needs: No Transportation Needs (05/21/2023)    PRAPARE - Administrator, Civil Service (Medical): No    Lack of Transportation (Non-Medical): No  Physical Activity: Sufficiently Active (05/21/2023)   Exercise Vital Sign    Days of Exercise per Week: 7 days    Minutes of Exercise per Session: 30 min  Stress: No Stress Concern Present (05/21/2023)   Amy Fry of Occupational Health - Occupational Stress Questionnaire    Feeling of Stress : Not at all  Social Connections: Socially Integrated (05/21/2023)   Social Connection and Isolation Panel [NHANES]    Frequency of Communication with Friends and Family: More than three times a week    Frequency of  Social Gatherings with Friends and Family: More than three times a week    Attends Religious Services: More than 4 times per year    Active Member of [employer] or Organizations: Yes    Attends Engineer, structural: More than 4 times per year    Marital Status: Married    Tobacco Counseling Counseling given: Not Answered Tobacco comments: a little as a teenager    Clinical Intake:  Pre-visit preparation completed: Yes  Pain : No/denies pain     BMI - recorded: 29.08 Nutritional Status: BMI 25 -29 Overweight Nutritional Risks: None Diabetes: Yes CBG done?: No Did pt. bring in CBG monitor from home?: No  Lab Results  Component Value Date   HGBA1C 6.3 10/10/2022   HGBA1C 5.8 (A) 03/15/2022   HGBA1C 6.3 09/05/2021     How often do you need to have someone help you when you read instructions, pamphlets, or other written materials from your doctor or pharmacy?: 1 - Never What is the last grade level you completed in school?: 12th grade  Interpreter Needed?: No  Information entered by :: [employer]   Activities of Daily Living     05/21/2023    9:45 AM 06/19/2022   10:40 AM  In your present state of health, do you have any difficulty performing the following activities:  Hearing? 0 0  Vision? 0 0  Difficulty concentrating or making  decisions? 0 0  Walking or climbing stairs? 0 0  Dressing or bathing? 0 0  Doing errands, shopping? 0 0  Preparing Food and eating ? N N  Using the Toilet? N N  In the past six months, have you accidently leaked urine? Y N  Do you have problems with loss of bowel control? N N  Managing your Medications? N N  Managing your Finances? N N  Housekeeping or managing your Housekeeping? N N    Patient Care Team: Tower, Manley Seeds, MD as PCP - General Cindra Cree, MD as Consulting Physician (Ophthalmology) Elta Halter, MD as Consulting Physician (Dermatology) Merryl Abraham, MD as Consulting Physician (Obstetrics and Gynecology) Alta Ast, [facility] (Inactive) as Pharmacist (Pharmacist)  Indicate any recent Medical Services you may have received from other than [facility] providers in the past year (date may be approximate).     Assessment:   This is a routine wellness examination for Amy Fry.  Hearing/Vision screen Hearing Screening - Comments:: Patient has no hearing difficulties Vision Screening - Comments:: Wears glasses   Goals Addressed               This Visit's Progress     Patient Stated (pt-stated)        Patient would like to finish washing her windows       Depression Screen     05/21/2023    9:47 AM 10/10/2022   10:22 AM 06/19/2022   10:35 AM 03/15/2022   10:10 AM 06/06/2021    2:52 PM 02/17/2020    9:51 AM 02/11/2019    3:29 PM  PHQ 2/9 Scores  PHQ - 2 Score 1 0 0 0 0 0 0  PHQ- 9 Score 3 0  0       Fall Risk     05/21/2023    9:43 AM 10/10/2022   10:21 AM 06/19/2022   10:39 AM 03/15/2022   10:10 AM 06/06/2021    2:55 PM  Fall Risk   Falls in the past year? 0 0 0 0 0  Number falls in past yr: 0 0 0 0 0  Injury with Fall? 0 0 0 0 0  Risk for fall due to : No Fall Risks No Fall Risks No Fall Risks No Fall Risks No Fall Risks  Follow up Falls prevention discussed;Falls evaluation completed Falls evaluation completed Falls prevention discussed;Falls evaluation  completed Falls evaluation completed     MEDICARE RISK AT HOME:  Medicare Risk at Home Any stairs in or around the home?: Yes If so, are there any without handrails?: No Home free of loose throw rugs in walkways, pet beds, electrical cords, etc?: Yes Adequate lighting in your home to reduce risk of falls?: Yes Life alert?: No Use of a cane, walker or w/c?: No Grab bars in the bathroom?: No Shower chair or bench in shower?: Yes Elevated toilet seat or a handicapped toilet?: Yes  TIMED UP AND GO:  Was the test performed?  No  Cognitive Function: 6CIT completed    02/05/2019   10:11 AM 01/30/2018    9:22 AM 01/24/2017   10:57 AM 12/14/2015    9:27 AM  MMSE - Mini Mental State Exam  Orientation to time 5 5 5 5   Orientation to Place 5 5 5 5   Registration 3 3 3 3   Attention/ Calculation 5 0 0 0  Recall 3 3 3 3   Language- name 2 objects  0 0 0  Language- repeat 1 1 1 1   Language- follow 3 step command  3 3 3   Language- read & follow direction  0 0 0  Write a sentence  0 0 0  Copy design  0 0 0  Total score  20 20 20         05/21/2023    9:42 AM 06/19/2022   10:36 AM 06/06/2021    2:56 PM  6CIT Screen  What Year? 0 points 0 points 0 points  What month? 0 points 0 points 0 points  What time? 0 points 0 points 0 points  Count back from 20 0 points 0 points 0 points  Months in reverse 0 points 0 points 0 points  Repeat phrase 0 points 0 points 0 points  Total Score 0 points 0 points 0 points    Immunizations Immunization History  Administered Date(s) Administered   PFIZER(Purple Top)SARS-COV-2 Vaccination 04/04/2019, 04/25/2019, 11/30/2019   Pneumococcal Conjugate-13 02/04/2018   Pneumococcal Polysaccharide-23 02/11/2019   Tdap 06/05/2011    Screening Tests Health Maintenance  Topic Date Due   DTaP/Tdap/Td (2 - Td or Tdap) 06/04/2021   MAMMOGRAM  12/01/2021   COVID-19 Vaccine (4 - 2024-25 season) 09/16/2022   Diabetic kidney evaluation - Urine ACR  03/15/2023    FOOT EXAM  03/15/2023   HEMOGLOBIN A1C  04/09/2023   Zoster Vaccines- Shingrix (1 of 2) 06/26/2024 (Originally 10/17/1964)   INFLUENZA VACCINE  12/13/2025 (Originally 08/16/2023)   OPHTHALMOLOGY EXAM  09/18/2023   Diabetic kidney evaluation - eGFR measurement  10/10/2023   Medicare Annual Wellness (AWV)  05/20/2024   Pneumonia Vaccine 27+ Years old  Completed   DEXA SCAN  Completed   Hepatitis C Screening  Completed   HPV VACCINES  Aged Out   Meningococcal B Vaccine  Aged Out   Colonoscopy  Discontinued    Health Maintenance  Health Maintenance Due  Topic Date Due   DTaP/Tdap/Td (2 - Td or Tdap) 06/04/2021   MAMMOGRAM  12/01/2021   COVID-19 Vaccine (4 - 2024-25 season) 09/16/2022   Diabetic kidney evaluation - Urine ACR  03/15/2023   FOOT EXAM  03/15/2023   HEMOGLOBIN A1C  04/09/2023   Health Maintenance Items Addressed: Diabetic Foot Exam scheduled, A1C, UACR (Urine Albumin:Creatinine Ratio). Mammo was requested from patient care team  because she states she had it done. She declined vaccinations  Additional Screening:  Vision Screening: Recommended annual ophthalmology exams for early detection of glaucoma and other disorders of the eye.  Dental Screening: Recommended annual dental exams for proper oral hygiene  Community Resource Referral / Chronic Care Management: CRR required this visit?  No   CCM required this visit?  No     Plan:     I have personally reviewed and noted the following in the patient's chart:   Medical and social history Use of alcohol, tobacco or illicit drugs  Current medications and supplements including opioid prescriptions. Patient is not currently taking opioid prescriptions. Functional ability and status Nutritional status Physical activity Advanced directives List of other physicians Hospitalizations, surgeries, and ER visits in previous 12 months Vitals Screenings to include cognitive, depression, and falls Referrals and  appointments  In addition, I have reviewed and discussed with patient certain preventive protocols, quality metrics, and best practice recommendations. A written personalized care plan for preventive services as well as general preventive health recommendations were provided to patient.     Freeda Jerry, [place]   05/21/2023   After Visit Summary: (MyChart) Due to this being a telephonic visit, the after visit summary with patients personalized plan was offered to patient via MyChart   Notes: Nothing significant to report at this time.

## 2023-07-17 ENCOUNTER — Ambulatory Visit: Admitting: Dermatology

## 2023-07-17 ENCOUNTER — Encounter: Payer: Self-pay | Admitting: Dermatology

## 2023-07-17 DIAGNOSIS — Z85828 Personal history of other malignant neoplasm of skin: Secondary | ICD-10-CM | POA: Diagnosis not present

## 2023-07-17 DIAGNOSIS — L905 Scar conditions and fibrosis of skin: Secondary | ICD-10-CM

## 2023-07-17 DIAGNOSIS — W908XXA Exposure to other nonionizing radiation, initial encounter: Secondary | ICD-10-CM

## 2023-07-17 DIAGNOSIS — Z1283 Encounter for screening for malignant neoplasm of skin: Secondary | ICD-10-CM | POA: Diagnosis not present

## 2023-07-17 DIAGNOSIS — L570 Actinic keratosis: Secondary | ICD-10-CM | POA: Diagnosis not present

## 2023-07-17 DIAGNOSIS — D1801 Hemangioma of skin and subcutaneous tissue: Secondary | ICD-10-CM

## 2023-07-17 DIAGNOSIS — D229 Melanocytic nevi, unspecified: Secondary | ICD-10-CM

## 2023-07-17 DIAGNOSIS — D2271 Melanocytic nevi of right lower limb, including hip: Secondary | ICD-10-CM

## 2023-07-17 DIAGNOSIS — L578 Other skin changes due to chronic exposure to nonionizing radiation: Secondary | ICD-10-CM

## 2023-07-17 DIAGNOSIS — L821 Other seborrheic keratosis: Secondary | ICD-10-CM | POA: Diagnosis not present

## 2023-07-17 DIAGNOSIS — D2261 Melanocytic nevi of right upper limb, including shoulder: Secondary | ICD-10-CM | POA: Diagnosis not present

## 2023-07-17 DIAGNOSIS — L814 Other melanin hyperpigmentation: Secondary | ICD-10-CM

## 2023-07-17 NOTE — Progress Notes (Signed)
 Follow-Up Visit   Subjective  Amy Fry is a 78 y.o. female who presents for the following: Skin Cancer Screening and Full Body Skin Exam  The patient presents for Total-Body Skin Exam (TBSE) for skin cancer screening and mole check. The patient has spots, moles and lesions to be evaluated, some may be new or changing. She has a tender spot on her left temple, present for years. She has a bump on her right nasal tip, not treated at last visit. History of BCC, right lat nasal bridge.   The following portions of the chart were reviewed this encounter and updated as appropriate: medications, allergies, medical history  Review of Systems:  No other skin or systemic complaints except as noted in HPI or Assessment and Plan.  Objective  Well appearing patient in no apparent distress; mood and affect are within normal limits.  A full examination was performed including scalp, head, eyes, ears, nose, lips, neck, chest, axillae, abdomen, back, buttocks, bilateral upper extremities, bilateral lower extremities, hands, feet, fingers, toes, fingernails, and toenails. All findings within normal limits unless otherwise noted below.   Relevant physical exam findings are noted in the Assessment and Plan.  R nasal tip Pink scaly macule.   Assessment & Plan   SKIN CANCER SCREENING PERFORMED TODAY.  ACTINIC DAMAGE - Chronic condition, secondary to cumulative UV/sun exposure - diffuse scaly erythematous macules with underlying dyspigmentation - Recommend daily broad spectrum sunscreen SPF 30+ to sun-exposed areas, reapply every 2 hours as needed.  - Staying in the shade or wearing long sleeves, sun glasses (UVA+UVB protection) and wide brim hats (4-inch brim around the entire circumference of the hat) are also recommended for sun protection.  - Call for new or changing lesions.  LENTIGINES, SEBORRHEIC KERATOSES, HEMANGIOMAS - Benign normal skin lesions - Benign-appearing - Call for any  changes  MELANOCYTIC NEVI - Tan-brown and/or pink-flesh-colored symmetric macules and papules - R elbow 6.26mm flesh pap - R post thigh 2.52mm med dark brown macules x 2 - Benign appearing on exam today. Stable. - Observation - Call clinic for new or changing moles - Recommend daily use of broad spectrum spf 30+ sunscreen to sun-exposed areas.   SEBORRHEIC KERATOSIS Exam: R temple/zygoma tan thin papule 3 x 2.0 cm   Treatment Plan: Reassured benign age-related growth.  Recommend observation.  Discussed cryotherapy if spot(s) become irritated or inflamed.  Call clinic for new or changing lesions.    HISTORY OF BASAL CELL CARCINOMA OF THE SKIN Right lateral nasal bridge, 2017 - No evidence of recurrence today - Recommend regular full body skin exams - Recommend daily broad spectrum sunscreen SPF 30+ to sun-exposed areas, reapply every 2 hours as needed.  - Call if any new or changing lesions are noted between office visits  SCAR Exam: 2.5 mm depressed white macule at right nasal ala  Benign-appearing.  Observation.  Call clinic for new or changing lesions. Recommend daily broad spectrum sunscreen SPF 30+, reapply every 2 hours as needed.   AK (ACTINIC KERATOSIS) R nasal tip Actinic keratoses are precancerous spots that appear secondary to cumulative UV radiation exposure/sun exposure over time. They are chronic with expected duration over 1 year. A portion of actinic keratoses will progress to squamous cell carcinoma of the skin. It is not possible to reliably predict which spots will progress to skin cancer and so treatment is recommended to prevent development of skin cancer.  Recommend daily broad spectrum sunscreen SPF 30+ to sun-exposed areas, reapply every  2 hours as needed.  Recommend staying in the shade or wearing long sleeves, sun glasses (UVA+UVB protection) and wide brim hats (4-inch brim around the entire circumference of the hat). Call for new or changing  lesions. Destruction of lesion - R nasal tip  Destruction method: cryotherapy   Informed consent: discussed and consent obtained   Lesion destroyed using liquid nitrogen: Yes   Region frozen until ice ball extended beyond lesion: Yes   Outcome: patient tolerated procedure well with no complications   Post-procedure details: wound care instructions given   Additional details:  Prior to procedure, discussed risks of blister formation, small wound, skin dyspigmentation, or rare scar following cryotherapy. Recommend Vaseline ointment to treated areas while healing.   Return in about 1 year (around 07/16/2024) for TBSE, Hx BCC, Hx AKs.  IAndrea Kerns, CMA, am acting as scribe for Rexene Rattler, MD .   Documentation: I have reviewed the above documentation for accuracy and completeness, and I agree with the above.  Rexene Rattler, MD

## 2023-07-17 NOTE — Patient Instructions (Addendum)

## 2023-07-22 ENCOUNTER — Encounter: Payer: HMO | Admitting: Dermatology

## 2023-09-20 DIAGNOSIS — H2513 Age-related nuclear cataract, bilateral: Secondary | ICD-10-CM | POA: Diagnosis not present

## 2023-09-20 DIAGNOSIS — H524 Presbyopia: Secondary | ICD-10-CM | POA: Diagnosis not present

## 2023-09-20 DIAGNOSIS — E119 Type 2 diabetes mellitus without complications: Secondary | ICD-10-CM | POA: Diagnosis not present

## 2023-09-20 LAB — HM DIABETES EYE EXAM

## 2023-09-27 DIAGNOSIS — H2513 Age-related nuclear cataract, bilateral: Secondary | ICD-10-CM | POA: Diagnosis not present

## 2023-10-14 ENCOUNTER — Other Ambulatory Visit: Payer: Self-pay | Admitting: Family Medicine

## 2023-10-14 NOTE — Telephone Encounter (Signed)
 Pt is overdue for CPE (labs prior) please schedule and then route back to me to refill meds

## 2023-10-14 NOTE — Telephone Encounter (Signed)
 I spoked with patient and she has been scheduled.

## 2023-10-22 NOTE — Progress Notes (Signed)
 Amy Fry                                          MRN: [id number]   10/22/2023   The [facility] Quality Team Specialist reviewed this patient medical record for the purposes of chart review for care gap closure. The following were reviewed: chart review for care gap closure-kidney health evaluation for diabetes:eGFR  and uACR.    [facility] Quality Team

## 2023-10-23 DIAGNOSIS — H2512 Age-related nuclear cataract, left eye: Secondary | ICD-10-CM | POA: Diagnosis not present

## 2023-10-23 DIAGNOSIS — H25812 Combined forms of age-related cataract, left eye: Secondary | ICD-10-CM | POA: Diagnosis not present

## 2023-10-23 DIAGNOSIS — H25012 Cortical age-related cataract, left eye: Secondary | ICD-10-CM | POA: Diagnosis not present

## 2023-11-05 ENCOUNTER — Telehealth: Payer: Self-pay | Admitting: Family Medicine

## 2023-11-05 DIAGNOSIS — E559 Vitamin D deficiency, unspecified: Secondary | ICD-10-CM

## 2023-11-05 DIAGNOSIS — E119 Type 2 diabetes mellitus without complications: Secondary | ICD-10-CM

## 2023-11-05 DIAGNOSIS — M810 Age-related osteoporosis without current pathological fracture: Secondary | ICD-10-CM

## 2023-11-05 DIAGNOSIS — I10 Essential (primary) hypertension: Secondary | ICD-10-CM

## 2023-11-05 DIAGNOSIS — E1169 Type 2 diabetes mellitus with other specified complication: Secondary | ICD-10-CM

## 2023-11-05 NOTE — Telephone Encounter (Signed)
-----   Message from Veva JINNY Ferrari sent at 10/21/2023  2:50 PM EDT ----- Regarding: Lab orders for [facility], 10.23.25 Patient is scheduled for CPX labs, please order future labs, Thanks , Veva

## 2023-11-07 ENCOUNTER — Ambulatory Visit: Payer: Self-pay | Admitting: Family Medicine

## 2023-11-07 ENCOUNTER — Other Ambulatory Visit (INDEPENDENT_AMBULATORY_CARE_PROVIDER_SITE_OTHER)

## 2023-11-07 DIAGNOSIS — E119 Type 2 diabetes mellitus without complications: Secondary | ICD-10-CM

## 2023-11-07 DIAGNOSIS — I10 Essential (primary) hypertension: Secondary | ICD-10-CM

## 2023-11-07 DIAGNOSIS — E785 Hyperlipidemia, unspecified: Secondary | ICD-10-CM | POA: Diagnosis not present

## 2023-11-07 DIAGNOSIS — Z7984 Long term (current) use of oral hypoglycemic drugs: Secondary | ICD-10-CM

## 2023-11-07 DIAGNOSIS — E1169 Type 2 diabetes mellitus with other specified complication: Secondary | ICD-10-CM

## 2023-11-07 DIAGNOSIS — E559 Vitamin D deficiency, unspecified: Secondary | ICD-10-CM | POA: Diagnosis not present

## 2023-11-07 LAB — COMPREHENSIVE METABOLIC PANEL WITH GFR
ALT: 10 U/L (ref 0–35)
AST: 17 U/L (ref 0–37)
Albumin: 4.5 g/dL (ref 3.5–5.2)
Alkaline Phosphatase: 73 U/L (ref 39–117)
BUN: 14 mg/dL (ref 6–23)
CO2: 29 meq/L (ref 19–32)
Calcium: 9.8 mg/dL (ref 8.4–10.5)
Chloride: 103 meq/L (ref 96–112)
Creatinine, Ser: 0.83 mg/dL (ref 0.40–1.20)
GFR: 68.17 mL/min (ref >60.00)
Glucose, Bld: 97 mg/dL (ref 70–99)
Potassium: 4.7 meq/L (ref 3.5–5.1)
Sodium: 140 meq/L (ref 135–145)
Total Bilirubin: 0.4 mg/dL (ref 0.2–1.2)
Total Protein: 6.7 g/dL (ref 6.0–8.3)

## 2023-11-07 LAB — CBC WITH DIFFERENTIAL/PLATELET
Basophils Absolute: 0.1 K/uL (ref 0.0–0.1)
Basophils Relative: 1.5 % (ref 0.0–3.0)
Eosinophils Absolute: 0.1 K/uL (ref 0.0–0.7)
Eosinophils Relative: 2.5 % (ref 0.0–5.0)
HCT: 35.2 % — ABNORMAL LOW (ref 36.0–46.0)
Hemoglobin: 11.3 g/dL — ABNORMAL LOW (ref 12.0–15.0)
Lymphocytes Relative: 42.3 % (ref 12.0–46.0)
Lymphs Abs: 2 K/uL (ref 0.7–4.0)
MCHC: 32.1 g/dL (ref 30.0–36.0)
MCV: 83.1 fl (ref 78.0–100.0)
Monocytes Absolute: 0.4 K/uL (ref 0.1–1.0)
Monocytes Relative: 8.9 % (ref 3.0–12.0)
Neutro Abs: 2.1 K/uL (ref 1.4–7.7)
Neutrophils Relative %: 44.8 % (ref 43.0–77.0)
Platelets: 284 K/uL (ref 150.0–400.0)
RBC: 4.24 Mil/uL (ref 3.87–5.11)
RDW: 17.6 % — ABNORMAL HIGH (ref 11.5–15.5)
WBC: 4.6 K/uL (ref 4.0–10.5)

## 2023-11-07 LAB — MICROALBUMIN / CREATININE URINE RATIO
Creatinine,U: 39.5 mg/dL
Microalb Creat Ratio: UNDETERMINED mg/g (ref 0.0–30.0)
Microalb, Ur: <0.7 mg/dL

## 2023-11-07 LAB — VITAMIN D 25 HYDROXY (VIT D DEFICIENCY, FRACTURES): VITD: 52.87 ng/mL (ref 30.00–100.00)

## 2023-11-07 LAB — HEMOGLOBIN A1C: Hgb A1c MFr Bld: 6.5 % (ref 4.6–6.5)

## 2023-11-07 LAB — LIPID PANEL
Cholesterol: 147 mg/dL (ref 0–200)
HDL: 67.1 mg/dL (ref >39.00)
LDL Cholesterol: 67 mg/dL (ref 0–99)
NonHDL: 80.24
Total CHOL/HDL Ratio: 2
Triglycerides: 68 mg/dL (ref 0.0–149.0)
VLDL: 13.6 mg/dL (ref 0.0–40.0)

## 2023-11-07 LAB — TSH: TSH: 1.9 u[IU]/mL (ref 0.35–5.50)

## 2023-11-12 NOTE — Addendum Note (Signed)
 Addended by: RAYLEEN GLENDORA HERO on: 11/12/2023 10:34 AM   Modules accepted: Orders

## 2023-11-14 ENCOUNTER — Ambulatory Visit: Admitting: Family Medicine

## 2023-11-14 ENCOUNTER — Encounter: Payer: Self-pay | Admitting: Family Medicine

## 2023-11-14 VITALS — BP 132/78 | HR 60 | Temp 98.0°F | Ht 62.5 in | Wt 162.1 lb

## 2023-11-14 DIAGNOSIS — D649 Anemia, unspecified: Secondary | ICD-10-CM | POA: Diagnosis not present

## 2023-11-14 DIAGNOSIS — Z0000 Encounter for general adult medical examination without abnormal findings: Secondary | ICD-10-CM | POA: Diagnosis not present

## 2023-11-14 DIAGNOSIS — R600 Localized edema: Secondary | ICD-10-CM | POA: Diagnosis not present

## 2023-11-14 DIAGNOSIS — I10 Essential (primary) hypertension: Secondary | ICD-10-CM

## 2023-11-14 DIAGNOSIS — E785 Hyperlipidemia, unspecified: Secondary | ICD-10-CM

## 2023-11-14 DIAGNOSIS — E1169 Type 2 diabetes mellitus with other specified complication: Secondary | ICD-10-CM

## 2023-11-14 DIAGNOSIS — E559 Vitamin D deficiency, unspecified: Secondary | ICD-10-CM

## 2023-11-14 DIAGNOSIS — M810 Age-related osteoporosis without current pathological fracture: Secondary | ICD-10-CM

## 2023-11-14 DIAGNOSIS — E119 Type 2 diabetes mellitus without complications: Secondary | ICD-10-CM

## 2023-11-14 DIAGNOSIS — Z7984 Long term (current) use of oral hypoglycemic drugs: Secondary | ICD-10-CM

## 2023-11-14 MED ORDER — LOVASTATIN 20 MG PO TABS: 20.0000 mg | ORAL_TABLET | Freq: Every day | ORAL | 3 refills | Status: AC

## 2023-11-14 MED ORDER — METFORMIN HCL 1000 MG PO TABS: ORAL_TABLET | ORAL | 3 refills | Status: AC

## 2023-11-14 MED ORDER — SPIRONOLACTONE 25 MG PO TABS: 12.5000 mg | ORAL_TABLET | Freq: Every day | ORAL | 3 refills | Status: AC

## 2023-11-14 MED ORDER — LISINOPRIL 5 MG PO TABS: 5.0000 mg | ORAL_TABLET | Freq: Every day | ORAL | 3 refills | Status: AC

## 2023-11-14 NOTE — Patient Instructions (Addendum)
 Try to exercise at least 5 days per week  Some pedaling Some strength training  Add some strength training to your routine, this is important for bone and brain health and can reduce your risk of falls and help your body use insulin  properly and regulate weight  Light weights, exercise bands , and internet videos are a good way to start  Yoga (chair or regular), machines , floor exercises or a gym with machines are also good options   You are a little anemic from donating blood We can watch that   Take care of yourself  Avoid added sugars in your diet when you can  Try to get most of your carbohydrates from produce (with the exception of white potatoes) and whole grains Eat less bread/pasta/rice/snack foods/cereals/sweets and other items from the middle of the grocery store (processed carbs)

## 2023-11-14 NOTE — Assessment & Plan Note (Signed)
 Last vitamin D  Lab Results  Component Value Date   VD25OH 52.87 11/07/2023   Vitamin D  level is therapeutic with current supplementation Disc importance of this to bone and overall health

## 2023-11-14 NOTE — Progress Notes (Signed)
 Subjective:    Patient ID: Amy Fry, female    DOB: October 21, 1945, 77 y.o.   MRN: [id number]  HPI  Here for health maintenance exam and to review chronic medical problems   Wt Readings from Last 3 Encounters:  11/14/23 162 lb 2 oz (73.5 kg)  05/21/23 159 lb (72.1 kg)  03/21/23 160 lb (72.6 kg)   29.18 kg/m  Vitals:   11/14/23 0849  BP: 132/78  Pulse: 60  Temp: 98 F (36.7 C)  SpO2: 98%    Immunization History  Administered Date(s) Administered   PFIZER(Purple Top)SARS-COV-2 Vaccination 04/04/2019, 04/25/2019, 11/30/2019   Pneumococcal Conjugate-13 02/04/2018   Pneumococcal Polysaccharide-23 02/11/2019   Tdap 06/05/2011    Health Maintenance Due  Topic Date Due   FOOT EXAM  03/15/2023   Mammogram  12/25/2023   Declines flu and shingrix vaccines   Cataract surgery went well  No longer wearing glasses  Mammogram 12/2022  at gyn-  Self breast exam-no lumps   Gyn health Sees gyn Appointment in feb 2026  Colon cancer screening  Colonoscopy 03/2023  hyperplastic polyp   Bone health  Dexa  [facility]  Did not tolerate alendronate  Falls-none Fractures-none  Supplements ca and D  Last vitamin D  Lab Results  Component Value Date   VD25OH 52.87 11/07/2023    Exercise  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  Uses pedaler     Mood    11/14/2023    9:38 AM 05/21/2023    9:47 AM 10/10/2022   10:22 AM 06/19/2022   10:35 AM 03/15/2022   10:10 AM  Depression screen PHQ 2/9  Decreased Interest 0 1 0 0 0  Down, Depressed, Hopeless 0 0 0 0 0  PHQ - 2 Score 0 1 0 0 0  Altered sleeping 1 2 0  0  Tired, decreased energy 0 0 0  0  Change in appetite 0 0 0  0  Feeling bad or failure about yourself  0 0 0  0  Trouble concentrating 0 0 0  0  Moving slowly or fidgety/restless 0 0 0  0  Suicidal thoughts 0 0 0  0  PHQ-9 Score 1 3 0  0  Difficult doing work/chores Not difficult at all Not difficult at all Not difficult at all  Not difficult  at all   PHQ 1 Occational trouble sleeping    HTN bp is stable today  No cp or palpitations or headaches or edema  No side effects to medicines  BP Readings from Last 3 Encounters:  11/14/23 132/78  05/21/23 131/76  03/21/23 131/76     Lab Results  Component Value Date   NA 140 11/07/2023   K 4.7 11/07/2023   CO2 29 11/07/2023   GLUCOSE 97 11/07/2023   BUN 14 11/07/2023   CREATININE 0.83 11/07/2023   CALCIUM 9.8 11/07/2023   GFR 68.17 11/07/2023   GFRNONAA >90 06/11/2013   Lisinopril  5 mg daily  Spironolactone  12.5 mg daily    Dm2 Lab Results  Component Value Date   HGBA1C 6.5 11/07/2023   HGBA1C 6.3 10/10/2022   HGBA1C 5.8 (A) 03/15/2022   Lab Results  Component Value Date   MICROALBUR <0.7 11/07/2023   MICROALBUR 0.2 07/30/2008   Metformin  1000 mg bid  No side effects   Tries to eat healthy  Sugar free sweets if any   Lots of produce  Regular protein       Hyperlipidemia Lab  Results  Component Value Date   CHOL 147 11/07/2023   CHOL 169 10/10/2022   CHOL 169 09/05/2021   Lab Results  Component Value Date   HDL 67.10 11/07/2023   HDL 69.10 10/10/2022   HDL 60.00 09/05/2021   Lab Results  Component Value Date   LDLCALC 67 11/07/2023   LDLCALC 82 10/10/2022   LDLCALC 92 09/05/2021   Lab Results  Component Value Date   TRIG 68.0 11/07/2023   TRIG 92.0 10/10/2022   TRIG 86.0 09/05/2021   Lab Results  Component Value Date   CHOLHDL 2 11/07/2023   CHOLHDL 2 10/10/2022   CHOLHDL 3 09/05/2021   No results found for: LDLDIRECT Lovastatin  20 mg daily  Gemfibrozil  600 mg bid   Lab Results  Component Value Date   ALT 10 11/07/2023   AST 17 11/07/2023   ALKPHOS 73 11/07/2023   BILITOT 0.4 11/07/2023   Lab Results  Component Value Date   TSH 1.90 11/07/2023    New mild anemia  Lab Results  Component Value Date   WBC 4.6 11/07/2023   HGB 11.3 (L) 11/07/2023   HCT 35.2 (L) 11/07/2023   MCV 83.1 11/07/2023   PLT 284.0  11/07/2023   Donates blood - twice this year     Patient Active Problem List   Diagnosis Date Noted   Mild anemia 11/14/2023   Need for hepatitis C screening test 12/13/2015   Herpes simplex 03/16/2014   Pedal edema 06/15/2013   History of fracture of arm 06/09/2013   Routine general medical examination at a health care facility 02/18/2011   Vitamin D  deficiency 07/30/2008   DERMATOPHYTOSIS, NAIL 07/26/2006   Diabetes mellitus treated with oral medication (HCC) 07/25/2006   Hyperlipidemia associated with type 2 diabetes mellitus (HCC) 07/25/2006   Essential hypertension 07/25/2006   Osteoporosis 07/25/2006   Past Medical History:  Diagnosis Date   Arthritis    Basal cell carcinoma 06/09/2015   Right lateral nasal bridge. Superficial.   Diabetes mellitus    type II   Early cataracts, bilateral    no sx yet    Hyperlipidemia    Hypertension    Osteopenia    Past Surgical History:  Procedure Laterality Date   CYSTOSCOPY  10-1999   FRACTURE SURGERY  2010   rib fx- from fall    LAPAROSCOPIC CHOLECYSTECTOMY     OOPHORECTOMY     ORIF HUMERUS FRACTURE Left 06/09/2013   Procedure: OPEN REDUCTION INTERNAL FIXATION (ORIF) PROXIMAL HUMERUS FRACTURE;  Surgeon: Ozell VEAR Bruch, MD;  Location: [facility] OR;  Service: Orthopedics;  Laterality: Left;   Social History   Tobacco Use   Smoking status: Former   Smokeless tobacco: Never   Tobacco comments:    a little as a teenager  Vaping Use   Vaping status: Never Used  Substance Use Topics   Alcohol use: No    Alcohol/week: 0.0 standard drinks of alcohol   Drug use: No   Family History  Problem Relation Age of Onset   Heart attack Mother    Heart attack Brother    Stroke Brother    Depression Sister    Colon cancer Neg Hx    Stomach cancer Neg Hx    Esophageal cancer Neg Hx    Rectal cancer Neg Hx    Allergies  Allergen Reactions   Alendronate Sodium Other (See Comments)    REACTION: reaction not known   Pravastatin  Sodium Other (See Comments)    REACTION:  headaches and arm pain   Atorvastatin Other (See Comments)    REACTION: headaches   Current Outpatient Medications on File Prior to Visit  Medication Sig Dispense Refill   Ascorbic Acid  (VITAMIN C ) 1000 MG tablet Take 500 mg by mouth daily.     aspirin  81 MG EC tablet Take 81 mg by mouth daily.     Blood Glucose Monitoring Suppl DEVI 1 each by Does not apply route daily. May substitute to any manufacturer covered by patient's insurance. 1 each 0   Calcium Carbonate-Vit D-Min (CALCIUM 1200 PO) Take 1 tablet by mouth 2 (two) times daily.     Cholecalciferol (VITAMIN D ) 2000 UNITS CAPS Take 2,000 Units by mouth daily.      CRANBERRY PO Take 4,200 Units by mouth daily.     gemfibrozil  (LOPID ) 600 MG tablet TAKE 1 TABLET BY MOUTH 2 TIMES A DAY 180 tablet 0   Glucose Blood (BLOOD GLUCOSE TEST STRIPS) STRP 1 each by In Vitro route daily. May substitute to any manufacturer covered by patient's insurance. 100 strip 3   Lancets (ONETOUCH DELICA PLUS LANCET33G) MISC Apply 1 each topically daily.     Omega-3 300 MG CAPS Take 300 mg by mouth 2 (two) times daily.      vitamin B-12 (CYANOCOBALAMIN ) 1000 MCG tablet Take 1,000 mcg by mouth daily.     No current facility-administered medications on file prior to visit.    Review of Systems  Constitutional:  Negative for activity change, appetite change, fatigue, fever and unexpected weight change.  HENT:  Negative for congestion, ear pain, rhinorrhea, sinus pressure and sore throat.   Eyes:  Negative for pain, redness and visual disturbance.  Respiratory:  Negative for cough, shortness of breath and wheezing.   Cardiovascular:  Negative for chest pain and palpitations.  Gastrointestinal:  Negative for abdominal pain, blood in stool, constipation and diarrhea.  Endocrine: Negative for polydipsia and polyuria.  Genitourinary:  Negative for dysuria, frequency and urgency.  Musculoskeletal:  Negative for arthralgias,  back pain and myalgias.  Skin:  Negative for pallor and rash.  Allergic/Immunologic: Negative for environmental allergies.  Neurological:  Negative for dizziness, syncope and headaches.  Hematological:  Negative for adenopathy. Does not bruise/bleed easily.  Psychiatric/Behavioral:  Negative for decreased concentration and dysphoric mood. The patient is not nervous/anxious.        Objective:   Physical Exam Constitutional:      General: She is not in acute distress.    Appearance: Normal appearance. She is well-developed. She is not ill-appearing or diaphoretic.  HENT:     Head: Normocephalic and atraumatic.     Right Ear: Tympanic membrane, ear canal and external ear normal.     Left Ear: Tympanic membrane, ear canal and external ear normal.     Nose: Nose normal. No congestion.     Mouth/Throat:     Mouth: Mucous membranes are moist.     Pharynx: Oropharynx is clear. No posterior oropharyngeal erythema.  Eyes:     General: No scleral icterus.    Extraocular Movements: Extraocular movements intact.     Conjunctiva/sclera: Conjunctivae normal.     Pupils: Pupils are equal, round, and reactive to light.  Neck:     Thyroid : No thyromegaly.     Vascular: No carotid bruit or JVD.  Cardiovascular:     Rate and Rhythm: Normal rate and regular rhythm.     Pulses: Normal pulses.     Heart sounds: Normal heart sounds.  No gallop.  Pulmonary:     Effort: Pulmonary effort is normal. No respiratory distress.     Breath sounds: Normal breath sounds. No wheezing.     Comments: Good air exch Chest:     Chest wall: No tenderness.  Abdominal:     General: Bowel sounds are normal. There is no distension or abdominal bruit.     Palpations: Abdomen is soft. There is no mass.     Tenderness: There is no abdominal tenderness.     Hernia: No hernia is present.  Genitourinary:    Comments: Breast and pelvic exam are done by gyn provider   Musculoskeletal:        General: No tenderness.  Normal range of motion.     Cervical back: Normal range of motion and neck supple. No rigidity. No muscular tenderness.     Right lower leg: No edema.     Left lower leg: No edema.     Comments: No kyphosis   Lymphadenopathy:     Cervical: No cervical adenopathy.  Skin:    General: Skin is warm and dry.     Coloration: Skin is not pale.     Findings: No erythema or rash.     Comments: Solar lentigines diffusely   Neurological:     Mental Status: She is alert. Mental status is at baseline.     Cranial Nerves: No cranial nerve deficit.     Motor: No abnormal muscle tone.     Coordination: Coordination normal.     Gait: Gait normal.     Deep Tendon Reflexes: Reflexes are normal and symmetric. Reflexes normal.  Psychiatric:        Mood and Affect: Mood normal.        Cognition and Memory: Cognition and memory normal.           Assessment & Plan:   Problem List Items Addressed This Visit       Cardiovascular and Mediastinum   Essential hypertension   bp in fair control at this time  BP Readings from Last 1 Encounters:  11/14/23 132/78   No changes needed Most recent labs reviewed  Disc lifstyle change with low sodium diet and exercise  Lisinopril  5 mg daily  Spironolactone  12.5 mg daily      Relevant Medications   lisinopril  (ZESTRIL ) 5 MG tablet   lovastatin  (MEVACOR ) 20 MG tablet   spironolactone  (ALDACTONE ) 25 MG tablet     Endocrine   Hyperlipidemia associated with type 2 diabetes mellitus (HCC)   Disc goals for lipids and reasons to control them Rev last labs with pt Rev low sat fat diet in detail   Lovastatin  20 mg daily  Gemfibrozil  600 mg bid   LDL of 67 At goal      Relevant Medications   lisinopril  (ZESTRIL ) 5 MG tablet   lovastatin  (MEVACOR ) 20 MG tablet   metFORMIN  (GLUCOPHAGE ) 1000 MG tablet   spironolactone  (ALDACTONE ) 25 MG tablet   Diabetes mellitus treated with oral medication (HCC)   Lab Results  Component Value Date   HGBA1C 6.5  11/07/2023   HGBA1C 6.3 10/10/2022   HGBA1C 5.8 (A) 03/15/2022   Microalb normal  Ace and statin  Metformin  1000 mg bid Eating better  Encouraged more strength building exercise Normal foot exam       Relevant Medications   lisinopril  (ZESTRIL ) 5 MG tablet   lovastatin  (MEVACOR ) 20 MG tablet   metFORMIN  (GLUCOPHAGE ) 1000 MG tablet  Musculoskeletal and Integument   Osteoporosis   Sees gyn  Dexa 12/2022  Did not tolerate alendronate No falls or fractures  Discussed fall prevention, supplements and exercise for bone density   She will d/w gyn        Other   Vitamin D  deficiency   Last vitamin D  Lab Results  Component Value Date   VD25OH 52.87 11/07/2023   Vitamin D  level is therapeutic with current supplementation Disc importance of this to bone and overall health       Routine general medical examination at a health care facility - Primary   Reviewed health habits including diet and exercise and skin cancer prevention Reviewed appropriate screening tests for age  Also reviewed health mt list, fam hx and immunization status , as well as social and family history   See HPI Labs reviewed and ordered Health Maintenance  Topic Date Due   Complete foot exam   03/15/2023   Breast Cancer Screening  12/25/2023   Flu Shot  04/14/2024*   Zoster (Shingles) Vaccine (1 of 2) 06/26/2024*   DTaP/Tdap/Td vaccine (2 - Td or Tdap) 11/13/2024*   COVID-19 Vaccine (4 - 2025-26 season) 11/29/2025*   Hemoglobin A1C  05/07/2024   Medicare Annual Wellness Visit  05/20/2024   Eye exam for diabetics  09/19/2024   Yearly kidney function blood test for diabetes  11/06/2024   Yearly kidney health urinalysis for diabetes  11/06/2024   Pneumococcal Vaccine for age over 15  Completed   DEXA scan (bone density measurement)  Completed   Hepatitis C Screening  Completed   Meningitis B Vaccine  Aged Out   Colon Cancer Screening  Discontinued  *Topic was postponed. The date shown is not  the original due date.    Declines flu and shingrix vaccines Has gyn appointment with mammo and dexa in feb 2026 Discussed fall prevention, supplements and exercise for bone density  PHQ1       Pedal edema   Continues spironolactone  12.5 mg daily  Also low sodium diet       Mild anemia   Hb 11.3 Recently donated blood-this is likely cause   Will have checked for next donation and update

## 2023-11-14 NOTE — Assessment & Plan Note (Signed)
 Lab Results  Component Value Date   HGBA1C 6.5 11/07/2023   HGBA1C 6.3 10/10/2022   HGBA1C 5.8 (A) 03/15/2022   Microalb normal  Ace and statin  Metformin  1000 mg bid Eating better  Encouraged more strength building exercise Normal foot exam

## 2023-11-14 NOTE — Assessment & Plan Note (Signed)
 Disc goals for lipids and reasons to control them Rev last labs with pt Rev low sat fat diet in detail   Lovastatin  20 mg daily  Gemfibrozil  600 mg bid   LDL of 67 At goal

## 2023-11-14 NOTE — Assessment & Plan Note (Signed)
 bp in fair control at this time  BP Readings from Last 1 Encounters:  11/14/23 132/78   No changes needed Most recent labs reviewed  Disc lifstyle change with low sodium diet and exercise  Lisinopril  5 mg daily  Spironolactone  12.5 mg daily

## 2023-11-14 NOTE — Assessment & Plan Note (Signed)
 Sees gyn  Dexa 12/2022  Did not tolerate alendronate No falls or fractures  Discussed fall prevention, supplements and exercise for bone density   She will d/w gyn

## 2023-11-14 NOTE — Assessment & Plan Note (Signed)
 Reviewed health habits including diet and exercise and skin cancer prevention Reviewed appropriate screening tests for age  Also reviewed health mt list, fam hx and immunization status , as well as social and family history   See HPI Labs reviewed and ordered Health Maintenance  Topic Date Due   Complete foot exam   03/15/2023   Breast Cancer Screening  12/25/2023   Flu Shot  04/14/2024*   Zoster (Shingles) Vaccine (1 of 2) 06/26/2024*   DTaP/Tdap/Td vaccine (2 - Td or Tdap) 11/13/2024*   COVID-19 Vaccine (4 - 2025-26 season) 11/29/2025*   Hemoglobin A1C  05/07/2024   Medicare Annual Wellness Visit  05/20/2024   Eye exam for diabetics  09/19/2024   Yearly kidney function blood test for diabetes  11/06/2024   Yearly kidney health urinalysis for diabetes  11/06/2024   Pneumococcal Vaccine for age over 57  Completed   DEXA scan (bone density measurement)  Completed   Hepatitis C Screening  Completed   Meningitis B Vaccine  Aged Out   Colon Cancer Screening  Discontinued  *Topic was postponed. The date shown is not the original due date.    Declines flu and shingrix vaccines Has gyn appointment with mammo and dexa in feb 2026 Discussed fall prevention, supplements and exercise for bone density  PHQ1

## 2023-11-14 NOTE — Assessment & Plan Note (Signed)
 Hb 11.3 Recently donated blood-this is likely cause   Will have checked for next donation and update

## 2023-11-14 NOTE — Assessment & Plan Note (Signed)
 Continues spironolactone  12.5 mg daily  Also low sodium diet

## 2024-01-09 ENCOUNTER — Other Ambulatory Visit: Payer: Self-pay | Admitting: Family Medicine

## 2024-05-21 ENCOUNTER — Ambulatory Visit

## 2024-05-22 ENCOUNTER — Ambulatory Visit

## 2024-07-28 ENCOUNTER — Encounter: Admitting: Dermatology
